# Patient Record
Sex: Female | Born: 1935 | Race: White | Hispanic: No | Marital: Married | Smoking: Never smoker
Health system: Southern US, Community
[De-identification: ages and names within clinical notes are randomized; demographics above are authoritative.]

## PROBLEM LIST (undated history)

## (undated) DIAGNOSIS — I447 Left bundle-branch block, unspecified: Secondary | ICD-10-CM

## (undated) DIAGNOSIS — E785 Hyperlipidemia, unspecified: Secondary | ICD-10-CM

## (undated) DIAGNOSIS — M1990 Unspecified osteoarthritis, unspecified site: Secondary | ICD-10-CM

## (undated) DIAGNOSIS — N289 Disorder of kidney and ureter, unspecified: Secondary | ICD-10-CM

## (undated) DIAGNOSIS — F419 Anxiety disorder, unspecified: Secondary | ICD-10-CM

## (undated) DIAGNOSIS — K635 Polyp of colon: Secondary | ICD-10-CM

## (undated) DIAGNOSIS — D649 Anemia, unspecified: Secondary | ICD-10-CM

## (undated) DIAGNOSIS — I2510 Atherosclerotic heart disease of native coronary artery without angina pectoris: Secondary | ICD-10-CM

## (undated) DIAGNOSIS — R040 Epistaxis: Secondary | ICD-10-CM

## (undated) DIAGNOSIS — N189 Chronic kidney disease, unspecified: Secondary | ICD-10-CM

## (undated) DIAGNOSIS — I10 Essential (primary) hypertension: Secondary | ICD-10-CM

## (undated) DIAGNOSIS — H269 Unspecified cataract: Secondary | ICD-10-CM

## (undated) HISTORY — DX: Atherosclerotic heart disease of native coronary artery without angina pectoris: I25.10

## (undated) HISTORY — DX: Anxiety disorder, unspecified: F41.9

## (undated) HISTORY — DX: Left bundle-branch block, unspecified: I44.7

## (undated) HISTORY — PX: BREAST SURGERY: SHX581

## (undated) HISTORY — DX: Epistaxis: R04.0

## (undated) HISTORY — DX: Unspecified cataract: H26.9

## (undated) HISTORY — PX: TONSILLECTOMY: SUR1361

## (undated) HISTORY — DX: Hyperlipidemia, unspecified: E78.5

## (undated) HISTORY — PX: PARTIAL HYSTERECTOMY: SHX80

---

## 2001-07-23 ENCOUNTER — Encounter: Payer: Self-pay | Admitting: Family Medicine

## 2001-07-23 ENCOUNTER — Ambulatory Visit (HOSPITAL_COMMUNITY): Admission: RE | Admit: 2001-07-23 | Discharge: 2001-07-23 | Payer: Self-pay | Admitting: Family Medicine

## 2002-10-15 ENCOUNTER — Ambulatory Visit (HOSPITAL_COMMUNITY): Admission: RE | Admit: 2002-10-15 | Discharge: 2002-10-15 | Payer: Self-pay | Admitting: Internal Medicine

## 2002-10-15 ENCOUNTER — Encounter: Payer: Self-pay | Admitting: Internal Medicine

## 2002-11-13 ENCOUNTER — Encounter: Payer: Self-pay | Admitting: Family Medicine

## 2002-11-13 ENCOUNTER — Ambulatory Visit (HOSPITAL_COMMUNITY): Admission: RE | Admit: 2002-11-13 | Discharge: 2002-11-13 | Payer: Self-pay | Admitting: Family Medicine

## 2002-11-14 ENCOUNTER — Encounter: Payer: Self-pay | Admitting: Family Medicine

## 2002-11-14 ENCOUNTER — Ambulatory Visit (HOSPITAL_COMMUNITY): Admission: RE | Admit: 2002-11-14 | Discharge: 2002-11-14 | Payer: Self-pay | Admitting: Family Medicine

## 2002-11-27 ENCOUNTER — Other Ambulatory Visit: Admission: RE | Admit: 2002-11-27 | Discharge: 2002-11-27 | Payer: Self-pay | Admitting: Obstetrics and Gynecology

## 2002-12-16 ENCOUNTER — Ambulatory Visit (HOSPITAL_COMMUNITY): Admission: RE | Admit: 2002-12-16 | Discharge: 2002-12-16 | Payer: Self-pay | Admitting: Obstetrics and Gynecology

## 2003-03-28 ENCOUNTER — Ambulatory Visit (HOSPITAL_COMMUNITY): Admission: RE | Admit: 2003-03-28 | Discharge: 2003-03-28 | Payer: Self-pay | Admitting: Internal Medicine

## 2003-04-04 ENCOUNTER — Ambulatory Visit (HOSPITAL_COMMUNITY): Admission: RE | Admit: 2003-04-04 | Discharge: 2003-04-04 | Payer: Self-pay | Admitting: Obstetrics and Gynecology

## 2003-06-06 ENCOUNTER — Ambulatory Visit (HOSPITAL_COMMUNITY): Admission: RE | Admit: 2003-06-06 | Discharge: 2003-06-06 | Payer: Self-pay | Admitting: Obstetrics and Gynecology

## 2003-06-19 ENCOUNTER — Inpatient Hospital Stay (HOSPITAL_COMMUNITY): Admission: RE | Admit: 2003-06-19 | Discharge: 2003-06-21 | Payer: Self-pay | Admitting: Obstetrics and Gynecology

## 2004-12-07 ENCOUNTER — Emergency Department (HOSPITAL_COMMUNITY): Admission: EM | Admit: 2004-12-07 | Discharge: 2004-12-07 | Payer: Self-pay | Admitting: Emergency Medicine

## 2004-12-24 ENCOUNTER — Ambulatory Visit (HOSPITAL_COMMUNITY): Admission: RE | Admit: 2004-12-24 | Discharge: 2004-12-24 | Payer: Self-pay | Admitting: Family Medicine

## 2005-02-16 ENCOUNTER — Ambulatory Visit (HOSPITAL_COMMUNITY): Admission: RE | Admit: 2005-02-16 | Discharge: 2005-02-16 | Payer: Self-pay | Admitting: Family Medicine

## 2005-09-21 ENCOUNTER — Ambulatory Visit (HOSPITAL_COMMUNITY): Admission: RE | Admit: 2005-09-21 | Discharge: 2005-09-21 | Payer: Self-pay | Admitting: Family Medicine

## 2006-08-21 ENCOUNTER — Ambulatory Visit (HOSPITAL_COMMUNITY): Admission: RE | Admit: 2006-08-21 | Discharge: 2006-08-21 | Payer: Self-pay | Admitting: Family Medicine

## 2006-09-25 ENCOUNTER — Ambulatory Visit (HOSPITAL_COMMUNITY): Admission: RE | Admit: 2006-09-25 | Discharge: 2006-09-25 | Payer: Self-pay | Admitting: Family Medicine

## 2007-08-01 ENCOUNTER — Ambulatory Visit (HOSPITAL_COMMUNITY): Admission: RE | Admit: 2007-08-01 | Discharge: 2007-08-01 | Payer: Self-pay | Admitting: Family Medicine

## 2007-08-06 ENCOUNTER — Ambulatory Visit (HOSPITAL_COMMUNITY): Admission: RE | Admit: 2007-08-06 | Discharge: 2007-08-06 | Payer: Self-pay | Admitting: Family Medicine

## 2008-02-19 ENCOUNTER — Ambulatory Visit (HOSPITAL_COMMUNITY): Admission: RE | Admit: 2008-02-19 | Discharge: 2008-02-19 | Payer: Self-pay | Admitting: Family Medicine

## 2008-06-17 ENCOUNTER — Ambulatory Visit (HOSPITAL_COMMUNITY): Admission: RE | Admit: 2008-06-17 | Discharge: 2008-06-17 | Payer: Self-pay | Admitting: Cardiology

## 2008-06-20 HISTORY — PX: CARDIAC CATHETERIZATION: SHX172

## 2009-08-07 ENCOUNTER — Ambulatory Visit (HOSPITAL_COMMUNITY): Admission: RE | Admit: 2009-08-07 | Discharge: 2009-08-07 | Payer: Self-pay | Admitting: Family Medicine

## 2009-08-10 ENCOUNTER — Ambulatory Visit (HOSPITAL_COMMUNITY): Admission: RE | Admit: 2009-08-10 | Discharge: 2009-08-10 | Payer: Self-pay | Admitting: Family Medicine

## 2010-03-24 ENCOUNTER — Ambulatory Visit (HOSPITAL_COMMUNITY): Admission: RE | Admit: 2010-03-24 | Discharge: 2010-03-24 | Payer: Self-pay | Admitting: Family Medicine

## 2010-05-27 ENCOUNTER — Emergency Department (HOSPITAL_COMMUNITY)
Admission: EM | Admit: 2010-05-27 | Discharge: 2010-05-28 | Payer: Self-pay | Source: Home / Self Care | Admitting: Emergency Medicine

## 2010-05-30 ENCOUNTER — Encounter: Payer: Self-pay | Admitting: Family Medicine

## 2010-05-31 LAB — DIFFERENTIAL
Eosinophils Absolute: 0.4 10*3/uL (ref 0.0–0.7)
Eosinophils Relative: 8 % — ABNORMAL HIGH (ref 0–5)
Lymphs Abs: 1.4 10*3/uL (ref 0.7–4.0)
Monocytes Absolute: 0.4 10*3/uL (ref 0.1–1.0)
Monocytes Relative: 8 % (ref 3–12)

## 2010-05-31 LAB — CBC
MCH: 33.4 pg (ref 26.0–34.0)
MCHC: 34.6 g/dL (ref 30.0–36.0)
MCV: 96.7 fL (ref 78.0–100.0)
Platelets: 149 10*3/uL — ABNORMAL LOW (ref 150–400)
RDW: 12.7 % (ref 11.5–15.5)

## 2010-05-31 LAB — POCT CARDIAC MARKERS: Troponin i, poc: <0.05 ng/mL (ref 0.00–0.09)

## 2010-05-31 LAB — BASIC METABOLIC PANEL
BUN: 30 mg/dL — ABNORMAL HIGH (ref 6–23)
Creatinine, Ser: 1.72 mg/dL — ABNORMAL HIGH (ref 0.4–1.2)
GFR calc non Af Amer: 29 mL/min — ABNORMAL LOW (ref >60)

## 2010-09-24 NOTE — H&P (Signed)
NAMEDORLISA, [name]                           ACCOUNT NO.:  [id number]   MEDICAL RECORD NO.:  [id number]                   PATIENT TYPE:  AMB   LOCATION:  DAY                                  FACILITY:  [facility]   PHYSICIAN:  Jonnie Kind, M.D.              DATE OF BIRTH:  02-Aug-1933   DATE OF ADMISSION:  DATE OF DISCHARGE:                                HISTORY & PHYSICAL   ADMISSION DIAGNOSIS:  Cervical dysplasia, endocervical involvement.   HISTORY OF PRESENT ILLNESS:  This 77 year old postmenopausal female is  admitted at this time after referral to our office.  She was admitted at  this time for cold knife conization.   [name] was seen in our office on referral from [facility], where Dr. Hulen Luster most recent Pap smear had shown suspected  high-grade dysplasia.  The final interpretation of the Pap smear was ASCUS-  H, in other words, atypical squamous cells, cannot exclude high-grade  dysplasia.  The patient was seen in our office, had colposcopic evaluation  on November 27, 2002, which showed no visible dysplasia on the exocervix on  colposcopic evaluation but did show a narrow endocervical canal, for which  endocervical curettage was performed.  This returned showing moderate-grade  dysplasia with endocervical epithelium mixed together.  The fact that she  has a dysplastic lesion inside the endocervical canal requires excision of  the endocervix.  The patient has been seen in our office, counseled over the  pros and cons of surgery, and desires to proceed at this time.   PAST MEDICAL HISTORY:  Positive for hypertension.   PAST SURGICAL HISTORY:  Negative.   INJURIES:  None.   ALLERGIES:  None.   MEDICATIONS:  Altace for blood pressure.   FAMILY HISTORY:  Positive for liver cancer in her mother at age 51 and  metastatic lung cancer in her father, cause of death at age 73.   PHYSICAL EXAMINATION:  GENERAL:  A healthy-appearing Caucasian female who  appears her stated age, who is alert and oriented x3.  HEENT:  Pupils equal, round, and reactive, extraocular movements intact.  NECK:  Supple, trachea midline.  CHEST:  Clear to auscultation.  ABDOMEN:  Nontender.  PELVIC:  External genitalia normal.  Cervix atrophic with atrophic vaginal  tissues.  Uterus anteflexed, within  normal limits size.  Adnexa negative for masses or tenderness.  EXTREMITIES:  Grossly normal.   PLAN:  Cold knife conization to be performed December 16, 2002, at [facility].                                               Jonnie Kind, M.D.    JVF/MEDQ  D:  12/10/2002  T:  12/10/2002  Job:  [id number]  cc:   Bonne Dolores, M.D.  [address], [place]  [place] [address]  Fax: [phone]

## 2010-09-24 NOTE — Op Note (Signed)
   NAMEJILENE, [name]                           ACCOUNT NO.:  [id number]   MEDICAL RECORD NO.:  [id number]                   PATIENT TYPE:  AMB   LOCATION:  DAY                                  FACILITY:  [facility]   PHYSICIAN:  Jonnie Kind, M.D.              DATE OF BIRTH:  09/24/1933   DATE OF PROCEDURE:  12/16/2002  DATE OF DISCHARGE:                                 OPERATIVE REPORT   PREOPERATIVE DIAGNOSIS:  Endocervical dysplasia.   POSTOPERATIVE DIAGNOSIS:  Endocervical dysplasia.   PROCEDURE:  Cold knife conization.   SURGEON:  Jonnie Kind, M.D.   ANESTHESIA:  General.   COMPLICATIONS:  None.   FINDINGS:  Good uterine descent.  Bulbous cervix.  Technically challenging  cone due to difficulty cutting through the fibrous endocervical tissues.  Posterior aspects of the endocervical canal were thoroughly biopsied.  The  anterior portion was only about 1 cm in transverse depth.   DETAILS OF PROCEDURE:  The patient was taken to the operating room and  prepped and draped in the usual fashion for a vaginal procedure with cervix  visible through a large Graves speculum and then the cervix grasped with a  single-tooth tenaculum.  We then decided to circumscribe the cervix with an  ellipse technique.  The cervix was circumscribed with a #15 blade, then  converted to a #11 for dissection through the thicker connective tissue of  the lower uterine segment.  The specimen was cored out in an apparent  symmetric fashion, inspected, and confirmed as being intact, but there was  more of the tissue removed posteriorly than anteriorly.  We inspected the  pedicle and did not feel like further surgical dissection was indicated, so  then we proceeded with Monsel's solution placement on the cervix and then  the patient advised to use Surgicel.  Follow-up will be in two weeks in our  office.                                                 Jonnie Kind, M.D.    JVF/MEDQ  D:   12/16/2002  T:  12/16/2002  Job:  [id number]

## 2010-09-24 NOTE — Op Note (Signed)
[name], [name]                           ACCOUNT NO.:  [id number]   MEDICAL RECORD NO.:  [id number]                   PATIENT TYPE:   LOCATION:                                       FACILITY:  [facility]   PHYSICIAN:  Jonnie Kind, M.D.              DATE OF BIRTH:  01/30/1934   DATE OF PROCEDURE:  DATE OF DISCHARGE:                                 OPERATIVE REPORT   ADMITTING DIAGNOSES:  1. Recurrent endocervical dysplasia.  2. Status post cold knife conization.  3. Right lower quadrant pain uncertain etiology.  4. Essentially hypertension.   DISCHARGE DIAGNOSES:  1. Recurrent endocervical dysplasia.  2. Status post cold knife conization.  3. Right lower quadrant pain uncertain etiology.  4. Essentially hypertension.   PROCEDURE:  1. Total abdominal hysterectomy.  2. Bilateral salpingo-oophorectomy.   SURGEON:  Jonnie Kind, M.D.   ASSISTANT:  None.   ANESTHESIA:  General.   COMPLICATIONS:  None.   ESTIMATED BLOOD LOSS:  100 cc.   DETAILS OF PROCEDURE:  The patient was taken to the operating room and  prepped and draped for lower abdominal surgery.  A Pfannenstiel-type  incision was performed with easy remaining approximately 10 cm in transverse  diameter.  A bladder flap was developed with ease on the lower uterine  segment and Balfour retractor positioned.   Round ligaments were taken down bilaterally followed by isolation of the  infundibulopelvic ligament confirming its safe position, then clamping,  cutting and doubly ligating.  We then skeletonized the uterine vessels  taking down the round ligaments, anteriorly, and gradually marching down the  broad ligament to reach the cardinal ligaments.  We had the cuff freed from  the bladder.  We marched down serially, clamping, cutting, and suture  ligating, with removal of the uterus tubes and ovaries.  The cuff was closed  in a single layer of running locking closure.  The pelvic was irrigated and  then the  patient allowed to have the abdomen closed by a 2-0 chromic closure  of the anterior  peritoneum, #0 Vicryl closure of the lateral fascia, 2-0 plain used to close  the subcu tissues with a Jackson-Pratt drain placed and staple closure.  The  patient tolerated the procedure well and went to recovery room in good  condition.      ___________________________________________                                            Jonnie Kind, M.D.   JVF/MEDQ  D:  08/03/2003  T:  08/04/2003  Job:  [id number]

## 2010-09-24 NOTE — H&P (Signed)
[name], [name]                           ACCOUNT NO.:  [id number]   MEDICAL RECORD NO.:  [id number]                   PATIENT TYPE:  AMB   LOCATION:  DAY                                  FACILITY:  [facility]   PHYSICIAN:  Jonnie Kind, M.D.              DATE OF BIRTH:  05/21/1933   DATE OF ADMISSION:  06/19/2003  DATE OF DISCHARGE:                                HISTORY & PHYSICAL   ADMITTING DIAGNOSES:  1. Recurrent cervical dysplasia, status post conization for endocervical     dysplasia.  2. Right lower quadrant pain x1 year with negative workup.  3. Mild anemia with negative workup.   HISTORY OF PRESENT ILLNESS:  This 77 year old post menopausal female is  admitted at this time for abdominal hysterectomy with removal of tubes and  ovaries and probable appendectomy for evaluation and correction of recurrent  cervical dysplasia. She was referred to our office from [facility], Dr. Bonne Dolores, last August and had a conization after  endocervical involvement of a high-grade cervical dysplasia was identified.  Generous cone specimen was obtained and we had a rather stenotic healing  cervix at the end of the procedure.  Postconization monitoring of the  cervical abnormality is returned abnormal with indications of the same  dysplastic process persisting despite the prior cone effort.  Pros and cons  of treatment have been discussed with the patient and our plan is to proceed  with a hysterectomy at this time.   REVIEW OF SYSTEMS:  Notable for chronic left-sided pain at the waist  rotating around somewhat from the back.  She has had an extensive workup  including an MRI ordered January 2005 which was negative for any right-sided  disk herniation.  There were degenerative changes of  the lumbar spine, but  nothing else noted.   During the past year the patient has had a screening colonoscopy which was  negative; and has had a CT of the abdomen and back and  kidney in 2004 all by  Dr. Caron Presume; of all which were negative.  Abdominal ultrasound and  transvaginal ultrasound shows a normal uterus calcifications considered  normal for the patient's age with no evidence of pelvic masses.  Right  kidney is grossly normal, and pancreas, aorta, and gallbladder and liver are  considered normal with common bile duct normal in diameter.   PAST MEDICAL HISTORY:  See review of systems and HPI.  PMH also positive for  hypertension, treated with Altace.   SURGICAL HISTORY:  Cold knife conization 2004; otherwise negative.   ALLERGIES:  None.   PHYSICAL EXAMINATION:  VITAL SIGNS:  Height 5 feet 2 inches; weight 151  which is a 4 pound weight loss since August 2004 (patient and husband have  been trying to loss weight; she has lost 4 pounds; he has lost 20).  HEENT:  The pupils are equal round, reactive.  Extraocular movements are  intact.  NECK:  Supple.  Trachea midline.  CHEST:  Clear to auscultation.  ABDOMEN:  Nontender.  PELVIC:  External genitalia normal with rough fibrotic cervix, status post  conization.  Uterus seems normal in size.  Adnexa are negative for masses,  but there is tenderness on palpation of the right lower quadrant as  described and worked up as noted in the HPI.   ADDITIONAL LABS:  Hemoglobin 10.7, hematocrit 31.6 with normochromic  normocytic values.  Platelets are normal at 193,000.  Electrolytes are  notable for a potassium 3.9, BUN 27, creatinine 1.4, with glucose mildly  elevated at 133 on  a nonfasting value.  Blood type is A positive.   PLAN:  Abdominal hysterectomy, removal of tubes and ovaries, evaluation of  right lower quadrant with probable removal of appendix on June 19, 2003.     ___________________________________________                                         Jonnie Kind, M.D.   JVF/MEDQ  D:  06/19/2003  T:  06/19/2003  Job:  [id number]   cc:   Jonnie Kind, M.D.  [place] [address]  Fax: [phone]   Bonne Dolores, M.D.  [address] [place]  [place] [address]  Fax: [phone]

## 2010-09-24 NOTE — Op Note (Signed)
[name], [name]                           ACCOUNT NO.:  [id number]   MEDICAL RECORD NO.:  [id number]                   PATIENT TYPE:  AMB   LOCATION:  DAY                                  FACILITY:  [facility]   PHYSICIAN:  Hildred Laser, M.D.                 DATE OF BIRTH:  Oct 23, 1933   DATE OF PROCEDURE:  03/28/2003  DATE OF DISCHARGE:                                 OPERATIVE REPORT   PROCEDURE:  Colonoscopy with polypectomy.   INDICATIONS FOR PROCEDURE:  [name] is a 77 year old Caucasian female who  is undergoing screening colonoscopy.  The procedure and risks were reviewed  with the patient, and informed consent was obtained.   PREOPERATIVE MEDICATIONS:  Demerol 50 mg IV, Versed 7 mg IV in divided  doses.   FINDINGS:  The procedure was performed in the endoscopy suite.  The  patient's vital signs and O2 saturations were monitored during the procedure  and remained stable.  The patient was placed in the left lateral recumbent  position and rectal examination performed.  No abnormality noted on external  or digital exam.  The Olympus videoscope was placed into the rectum and  advanced into the region of the sigmoid colon and beyond.  The preparation  was excellent.  The scope was passed to the cecum.  She had some stool  coating the cecal mucosa, but this did not interfere with the quality of the  exam.  The cecal landmarks were well-identified and photographed for the  record.  There was a small polyp to the right of the appendiceal orifice  which was ablated via cold biopsy.  There was another flat polyp on a fold  at the hepatic flexure which was snared piecemeal.  The mucosa of the rest  of the colon was normal.  The rectal mucosa similarly was normal.  The scope  was retroflexed to examine the anorectal junction, with prominent anal  papilla.  The endoscope was straightened and withdrawn.  The patient  tolerated the procedure well.   FINAL DIAGNOSES:  1. Examination  performed to the cecum.  2. Two small polyps.  The one from the cecum was ablated via cold biopsy and     the other one from the hepatic flexure was snared.  This was a flat polyp     about 8 to 10 mm in maximal diameter.   RECOMMENDATIONS:  1. Standard instructions given.  2. I will be contacting the patient with the biopsy results and timing of     her next colonoscopy.      ___________________________________________                                            Hildred Laser, M.D.   NR/MEDQ  D:  03/28/2003  T:  03/28/2003  Job:  [id number]   cc:   Bonne Dolores, M.D.  [address], [place] [address]  Fax: [phone]

## 2010-09-24 NOTE — Discharge Summary (Signed)
NAMELETTYE, BOHLS                           ACCOUNT NO.:  [id number]   MEDICAL RECORD NO.:  [id number]                   PATIENT TYPE:  INP   LOCATION:  A403                                 FACILITY:  [facility]   PHYSICIAN:  Jonnie Kind, M.D.              DATE OF BIRTH:  08/27/1933   DATE OF ADMISSION:  06/19/2003  DATE OF DISCHARGE:  06/21/2003                                 DISCHARGE SUMMARY   ADMISSION DIAGNOSES:  1. Recurrent endocervical dysplasia, status post cold-knife conization.  2. Right lower quadrant pain of uncertain etiology.  3. Essential hypertension.   DISCHARGE DIAGNOSES:  1. Recurrent endocervical dysplasia.  2. Right lower quadrant pain.  3. Essential hypertension.  4. Mild anemia, uncertain etiology.   PROCEDURE:  On June 18, 2002, total abdominal hysterectomy with  bilateral salingo-oophorectomy by Dr. Glo Herring.   DISCHARGE MEDICATIONS:  1. Altace 5 mg p.o. q.d.  2. Motrin 200 mg two tablets q.4h. p.r.n. mild pain.   FOLLOW UP:  Follow up in one week for staple removal in our office.  Follow  up in two weeks for blood pressure recheck with Dr. Caron Presume.   HISTORY OF PRESENT ILLNESS:  This 77 year old, post menopausal female is  admitted for hysterectomy after recurrent cervical dysplasia, status post  cold-knife conization that healed poorly and was perceived as difficult to  follow due to the stenotic healing that occurred.  Additionally, she has  chronic right lower quadrant pain frustrating the patient.  The patient has  had a negative workup including colonoscopy and CT of the abdomen in 2004.  Preoperative workup was notable for mild anemia with hemoglobin 10,  hematocrit 31.6 on preop CBC.  Platelets were normochromic, normocytic.  BUN  was 27 and creatinine 1.4 on admitting labs with potassium 3.9.   Additionally, preoperative EKG shows normal size rhythm with left bundle  branch block and was suggested to be considered for possible left  atrial  enlargement.   HOSPITAL COURSE:  The patient tolerated the surgical procedure well.  She  had a 100 cc blood loss and did not require transfusion despite the  preoperative anemia.  Postoperative course was notable for impressively  elevated blood pressures from [id number] systolic with diastolics in the [id number]  usually.  She required her regular Altace and in addition required  Apresoline on several occasions to keep blood pressures in the 0000000 systolic  range.  The patient states that her normal pressures run in the 160 range.   Pathology report is pending at the time of discharge on postop day #2 with  hematocrit noted at 28.2, hemoglobin 9.7 within normal limits considering  surgical blood loss.   The patient was discharged to home and is asked to follow up in one week in  our office and then also to see Dr. Caron Presume with serial blood pressure  checks from home.  It is possible, particularly in  light of the elevated BUN  and creatinine, she may need blood pressure medication adjustments.     ___________________________________________                                         Jonnie Kind, M.D.   JVF/MEDQ  D:  06/21/2003  T:  06/21/2003  Job:  [id number]   cc:   Bonne Dolores, M.D.  [address] [place] [address]  Fax: [phone]

## 2010-10-08 HISTORY — PX: TRANSTHORACIC ECHOCARDIOGRAM: SHX275

## 2010-11-04 ENCOUNTER — Encounter (HOSPITAL_COMMUNITY): Payer: Self-pay

## 2010-11-04 ENCOUNTER — Ambulatory Visit (HOSPITAL_COMMUNITY)
Admission: RE | Admit: 2010-11-04 | Discharge: 2010-11-04 | Disposition: A | Discharge status: Home / Self Care | Payer: Medicare Other | Source: Ambulatory Visit | Attending: Cardiovascular Disease | Admitting: Cardiovascular Disease

## 2010-11-04 ENCOUNTER — Other Ambulatory Visit (HOSPITAL_COMMUNITY): Payer: Self-pay | Admitting: Cardiovascular Disease

## 2010-11-04 DIAGNOSIS — R0602 Shortness of breath: Secondary | ICD-10-CM | POA: Insufficient documentation

## 2010-11-04 DIAGNOSIS — R0789 Other chest pain: Secondary | ICD-10-CM | POA: Insufficient documentation

## 2010-11-04 HISTORY — DX: Essential (primary) hypertension: I10

## 2010-11-05 ENCOUNTER — Ambulatory Visit (HOSPITAL_COMMUNITY)
Admission: RE | Admit: 2010-11-05 | Discharge: 2010-11-05 | Disposition: A | Discharge status: Home / Self Care | Payer: Medicare Other | Source: Ambulatory Visit | Attending: Cardiovascular Disease | Admitting: Cardiovascular Disease

## 2010-11-05 DIAGNOSIS — I10 Essential (primary) hypertension: Secondary | ICD-10-CM | POA: Insufficient documentation

## 2010-11-05 DIAGNOSIS — E785 Hyperlipidemia, unspecified: Secondary | ICD-10-CM | POA: Insufficient documentation

## 2010-11-05 DIAGNOSIS — I2510 Atherosclerotic heart disease of native coronary artery without angina pectoris: Secondary | ICD-10-CM | POA: Insufficient documentation

## 2010-11-05 DIAGNOSIS — R0602 Shortness of breath: Secondary | ICD-10-CM | POA: Insufficient documentation

## 2010-11-05 DIAGNOSIS — Z01818 Encounter for other preprocedural examination: Secondary | ICD-10-CM | POA: Insufficient documentation

## 2010-11-05 DIAGNOSIS — R079 Chest pain, unspecified: Secondary | ICD-10-CM | POA: Insufficient documentation

## 2010-11-05 DIAGNOSIS — N289 Disorder of kidney and ureter, unspecified: Secondary | ICD-10-CM | POA: Insufficient documentation

## 2010-11-05 DIAGNOSIS — I447 Left bundle-branch block, unspecified: Secondary | ICD-10-CM | POA: Insufficient documentation

## 2010-11-08 ENCOUNTER — Ambulatory Visit (HOSPITAL_COMMUNITY)
Admission: RE | Admit: 2010-11-08 | Discharge: 2010-11-10 | Disposition: A | Discharge status: Home / Self Care | Payer: Medicare Other | Source: Ambulatory Visit | Attending: Cardiovascular Disease | Admitting: Cardiovascular Disease

## 2010-11-08 DIAGNOSIS — I200 Unstable angina: Secondary | ICD-10-CM | POA: Insufficient documentation

## 2010-11-08 DIAGNOSIS — Z01812 Encounter for preprocedural laboratory examination: Secondary | ICD-10-CM | POA: Insufficient documentation

## 2010-11-08 DIAGNOSIS — Z79899 Other long term (current) drug therapy: Secondary | ICD-10-CM | POA: Insufficient documentation

## 2010-11-08 DIAGNOSIS — I129 Hypertensive chronic kidney disease with stage 1 through stage 4 chronic kidney disease, or unspecified chronic kidney disease: Secondary | ICD-10-CM | POA: Insufficient documentation

## 2010-11-08 DIAGNOSIS — I447 Left bundle-branch block, unspecified: Secondary | ICD-10-CM | POA: Insufficient documentation

## 2010-11-08 DIAGNOSIS — I2510 Atherosclerotic heart disease of native coronary artery without angina pectoris: Secondary | ICD-10-CM | POA: Insufficient documentation

## 2010-11-08 DIAGNOSIS — E785 Hyperlipidemia, unspecified: Secondary | ICD-10-CM | POA: Insufficient documentation

## 2010-11-08 DIAGNOSIS — Z01810 Encounter for preprocedural cardiovascular examination: Secondary | ICD-10-CM | POA: Insufficient documentation

## 2010-11-08 DIAGNOSIS — N184 Chronic kidney disease, stage 4 (severe): Secondary | ICD-10-CM | POA: Insufficient documentation

## 2010-11-08 DIAGNOSIS — D61818 Other pancytopenia: Secondary | ICD-10-CM | POA: Insufficient documentation

## 2010-11-08 HISTORY — PX: CORONARY ANGIOPLASTY WITH STENT PLACEMENT: SHX49

## 2010-11-08 LAB — COMPREHENSIVE METABOLIC PANEL
Alkaline Phosphatase: 73 U/L (ref 39–117)
BUN: 26 mg/dL — ABNORMAL HIGH (ref 6–23)
GFR calc Af Amer: 34 mL/min — ABNORMAL LOW (ref >60)
GFR calc non Af Amer: 28 mL/min — ABNORMAL LOW (ref >60)
Glucose, Bld: 102 mg/dL — ABNORMAL HIGH (ref 70–99)
Sodium: 140 mEq/L (ref 135–145)
Total Bilirubin: 0.4 mg/dL (ref 0.3–1.2)

## 2010-11-08 LAB — APTT: aPTT: 32 seconds (ref 24–37)

## 2010-11-08 LAB — CBC
MCHC: 33.7 g/dL (ref 30.0–36.0)
Platelets: 141 10*3/uL — ABNORMAL LOW (ref 150–400)
RDW: 12.8 % (ref 11.5–15.5)
WBC: 3.4 10*3/uL — ABNORMAL LOW (ref 4.0–10.5)

## 2010-11-08 LAB — PLATELET INHIBITION P2Y12: Platelet Function  P2Y12: 390 [PRU] (ref 194–418)

## 2010-11-08 LAB — PROTIME-INR: INR: 1.03 (ref 0.00–1.49)

## 2010-11-08 NOTE — Cardiovascular Report (Signed)
NAMECYBILL, BRINKMANN NO.:  [id number]  MEDICAL RECORD NO.:  [id number]  LOCATION:  [facility]                         FACILITY:  [facility]  PHYSICIAN:  Shelva Majestic, M.D.     DATE OF BIRTH:  1933-12-16  DATE OF PROCEDURE:  11/05/2010 DATE OF DISCHARGE:  11/05/2010                           CARDIAC CATHETERIZATION   INDICATIONS:  [name] is a 77 year old female who has documented coronary disease by prior cardiac catheterization in February 2010.  At that time, she was treated medically.  She has a history of hypertension, hyperlipidemia, and chronic left bundle-branch block.  She has renal insufficiency with creatinines in the 1.8 range which have remained stable.  Over the past week, she has noticed a progressive decline in exertional capacity with recurrent episodes of exertionally precipitated chest pressure associated with shortness of breath and arm radiation.  She was seen in the [place] office yesterday.  In light of her prior coronary artery disease with increasing symptoms on medical therapy, she presents now for cardiac catheterization.  She was told to hold her ACE inhibition last evening and this morning and her nitrates were increased.  Prior to the procedure, the patient was hydrated this morning and also was started on sodium bicarbonate.  Her creatinine yesterday was stable at 1.8.  PROCEDURE:  After premedication with Versed 1 mg plus fentanyl 25 mcg, the patient was prepped and draped in the usual fashion.  The right femoral artery was punctured anteriorly and a 5-French sheath was inserted without difficulty.  Diagnostic cardiac catheterization was done utilizing 5-French Judkins #4 left coronary catheter.  A JR-4 right was initially used, but due to suboptimal engagement a 5-French no- torque catheter was used for selective angiography in the right coronary artery.  A 5-French pigtail catheter was then inserted and advanced into the left  ventricle.  Due the patient's renal insufficiency, left ventriculography was performed.  LV pressures were recorded and the catheter was pulled back into the central aorta.  Plans were to reassess LV function with echo Doppler evaluation today.  The patient tolerated the procedure well.  Hemostasis was obtained by direct manual pressure.  Central aortic pressure was 175/76.  Left ventricular pressure 175/12, post A-wave 17.  ANGIOGRAPHIC DATA:  Left main coronary was an upward takeoff vessel that trifurcated into an LAD, ramus intermediate vessel, and left circumflex system.  The LAD was significantly calcified in its proximal segment extending to the mid segment.  There was 30% narrowing proximal to the septal perforating artery and diagonal vessel with eccentric calcified 80% stenosis after the diagonal vessel.  The remainder of the LAD was free of significant disease and extended around the apex.  The ramus intermediate vessel had diffuse proximal 30% narrowing.  The circumflex vessel gave rise to one proximal marginal vessel and ended in an inferior LV-like branch extending to the apex.  The right coronary artery was a large calcified vessel that had minimal luminal irregularity proximally.  There was 30% narrowing in the ostium of the PDA.  IMPRESSION: 1. Systemic hypertension. 2. Diffuse coronary calcification with 30-80% progressive calcified     stenosis in the left anterior descending artery  proximally at the     region of the septal and diagonal takeoff.  RECOMMENDATIONS:  The patient will be aggressively hydrated following the procedure and we will continue sodium bicarbonate infusion.  A 2-D echo Doppler study will be done to reassess LV function.  She will be tentatively scheduled for deferred PCI tentatively scheduled for November 08, 2010, with rotational atherectomy, PCI to the LAD.          ______________________________ Shelva Majestic, M.D.     TK/MEDQ  D:   11/05/2010  T:  11/05/2010  Job:  [id number]  cc:   Halford Chessman, M.D.  Electronically Signed by Shelva Majestic M.D. on 11/08/2010 04:31:01 PM

## 2010-11-09 LAB — HEMOGLOBIN AND HEMATOCRIT, BLOOD
HCT: 26.3 % — ABNORMAL LOW (ref 36.0–46.0)
HCT: 27.3 % — ABNORMAL LOW (ref 36.0–46.0)
Hemoglobin: 9.1 g/dL — ABNORMAL LOW (ref 12.0–15.0)

## 2010-11-09 LAB — BASIC METABOLIC PANEL
BUN: 21 mg/dL (ref 6–23)
Chloride: 106 mEq/L (ref 96–112)
GFR calc Af Amer: 39 mL/min — ABNORMAL LOW (ref >60)
Glucose, Bld: 98 mg/dL (ref 70–99)
Potassium: 4 mEq/L (ref 3.5–5.1)
Sodium: 139 mEq/L (ref 135–145)

## 2010-11-09 LAB — CARDIAC PANEL(CRET KIN+CKTOT+MB+TROPI): Relative Index: INVALID (ref 0.0–2.5)

## 2010-11-09 LAB — CBC
HCT: 24.9 % — ABNORMAL LOW (ref 36.0–46.0)
Hemoglobin: 8.4 g/dL — ABNORMAL LOW (ref 12.0–15.0)
WBC: 3.9 10*3/uL — ABNORMAL LOW (ref 4.0–10.5)

## 2010-11-10 LAB — CBC
HCT: 24.6 % — ABNORMAL LOW (ref 36.0–46.0)
MCHC: 33.3 g/dL (ref 30.0–36.0)
Platelets: 106 10*3/uL — ABNORMAL LOW (ref 150–400)
RDW: 13.2 % (ref 11.5–15.5)
WBC: 3.1 10*3/uL — ABNORMAL LOW (ref 4.0–10.5)

## 2010-11-10 LAB — BASIC METABOLIC PANEL
Chloride: 109 mEq/L (ref 96–112)
Creatinine, Ser: 1.86 mg/dL — ABNORMAL HIGH (ref 0.50–1.10)
GFR calc Af Amer: 32 mL/min — ABNORMAL LOW (ref >60)
GFR calc non Af Amer: 26 mL/min — ABNORMAL LOW (ref >60)

## 2010-11-12 ENCOUNTER — Other Ambulatory Visit (HOSPITAL_BASED_OUTPATIENT_CLINIC_OR_DEPARTMENT_OTHER): Payer: Self-pay | Admitting: Family Medicine

## 2010-11-12 ENCOUNTER — Other Ambulatory Visit (HOSPITAL_COMMUNITY): Payer: Self-pay | Admitting: Family Medicine

## 2010-11-23 NOTE — Discharge Summary (Signed)
  [name], [name] NO.:  [id number]  MEDICAL RECORD NO.:  [id number]  LOCATION:  2917                         FACILITY:  [facility]  PHYSICIAN:  Shelva Majestic, M.D.     DATE OF BIRTH:  1933/10/12  DATE OF ADMISSION:  11/08/2010 DATE OF DISCHARGE:  11/10/2010                              DISCHARGE SUMMARY   DISCHARGE DIAGNOSES: 1. Recent onset of unstable angina, admitted on November 08, 2010, for     elective left anterior descending coronary artery intervention. 2. Stage III-IV chronic renal insufficiency with a creatinine of 1.86     at discharge. 3. Pancytopenia, etiology not fully determined, there is a component     of blood loss anemia post percutaneous coronary intervention. 4. Treated dyslipidemia. 5. Treated hypertension.  HOSPITAL COURSE:  The patient is a pleasant 77 year old female with known coronary disease.  She has had previous catheterization in 2010, and had an 80% LAD stenosis.  Medical therapy was initiated.  She has chronic renal insufficiency.  She presented on November 04, 2010, with increasing anginal symptoms.  She was admitted for an outpatient catheterization on November 05, 2010, and this showed a 80% LAD lesion.  It was decided to hydrate her and electively intervene on the LAD.  This was done on November 08, 2010.  She had high-speed rotational atherectomy and a Promus stent placed.  She tolerated the procedure well.  She did have a slight bump in her troponin postprocedure to 0.32.  She also dropped her hemoglobin to a level of 8.4.  Creatinine rose to 1.86 at discharge. Dr. Debara Pickett, saw her on the morning of November 10, 2010.  He feels her hemoglobin is now stable at 8.2.  He notes she has pancytopenia with a white count of 3.1 and platelet count of 106.  He suggest she be considered for further hematologic workup as an outpatient, this will be deferred to her primary care doctor.  Her creatinine at discharge is 1.86.  We will check followup labs on  Monday and she will see [name], in followup.  Please see med rec for complete discharge medications.  LABORATORY DATA:  At discharge, white count 3.1, hemoglobin 8.2, hematocrit 24.6, platelets 106.  Sodium 142, potassium 4.3, BUN 27, creatinine 1.86, troponin peaked at 0.32.  On admission, her creatinine was 1.76, hemoglobin was 10.2.  INR was 1.03.  Chest x-ray on October 27, 2010, shows no acute abnormalities.  EKG shows sinus rhythm, left bundle- branch block.  DISPOSITION:  The patient discharged in stable condition and will follow up with [name], in [place] and Dr. Hilma Favors.  She may need a Hematology referral as an outpatient.  We will check labs next week.     Erlene Quan, P.A.   ______________________________ Shelva Majestic, M.D.    Meryl Dare  D:  11/10/2010  T:  11/10/2010  Job:  [id number]  cc:   Halford Chessman, M.D.  Electronically Signed by Kerin Ransom P.A. on 11/11/2010 12:01:54 PM Electronically Signed by Shelva Majestic M.D. on 11/23/2010 03:29:16 PM

## 2010-11-23 NOTE — Cardiovascular Report (Signed)
[name], [name] NO.:  [id number]  MEDICAL RECORD NO.:  [id number]  LOCATION:  2917                         FACILITY:  [facility]  PHYSICIAN:  Shelva Majestic, M.D.     DATE OF BIRTH:  February 27, 1934  DATE OF PROCEDURE:  11/08/2010 DATE OF DISCHARGE:                           CARDIAC CATHETERIZATION   PROCEDURE:  Complex percutaneous coronary intervention.  OPERATOR:  Shelva Majestic, MD  INDICATIONS:  [name] is a very pleasant 77 year old female with documented coronary artery disease by initial cardiac catheterization in February 2010.  She has a history of hypertension, hyperlipidemia, chronic left bundle-branch block as well as chronic renal insufficiency with creatinines in the 1.8 range.  Due to worsening symptoms of exertional chest pain on November 05, 2010, a repeat cardiac catheterization showed no progression of her calcified LAD lesion which was 80% stenosed in the region of the diagonal and septal perforating artery.  The proximal LAD was significantly calcified.  She had 30% narrowing in the intermediate vessel, normal left circumflex system, and calcification of the right coronary artery with 30% RCA narrowing.  The patient was aggressively hydrated following her cardiac catheterization.  She now represents today for attempt at complex percutaneous coronary intervention with high-speed rotational atherectomy/PTCA/stenting, however, calcified LAD lesion.  Of note, creatinine today was 1.78 and the patient was pretreated as she was last week with fluids as well as sodium bicarbonate.  PROCEDURE IN DETAIL:  After premedication with Versed 2 mg plus fentanyl 25 mcg, the patient prepped and draped in usual fashion.  Her right femoral artery and right femoral vein were punctured anteriorly and a 7- French arterial sheath and 6-French venous sheath were inserted.  A transvenous pacemaker was advanced to the RV apex with documentation of excellent  capture and was used for backup during the high-speed rotational atherectomy procedure.  A 7-French Q-curve 3.5 guide was used for the intervention.  The patient had received 600 mg of Plavix approximately 3 hours prior to the procedure.  Bivalirudin was administered and ACT was documented to be therapeutic.  Initial attempts at passing a roto floppy wire were unsuccessful with the wire seemingly to get into every small branch, but not able to get to and cross the lesion.  After multiple attempts at this, decision was made to use a Luge wire in its place for cannulation of the lesion which was successful.  A transient like catheter was then inserted for exchange and was advanced distally to the end of the Luge wire.  The Luge wire was then removed.  The roto floppy wire was then attempted to be inserted but apparently this was never able to get distal to the distal aspect of the transient type catheter.  Consequently, the entire system was then removed.  Another attempt was made with a roto floppy wire by itself, again unsuccessful.  The Luge wire was then re-advanced and was down the LAD. A 2.5 TREK over the wire balloon was then inserted for a wire exchange and the Luge wire was removed and in its place the roto floppy wire was advanced to the LAD.  A 1.5 mm bur was then platformed at  175 revolutions per minute.  The 1.5 bur was advanced to the distal tip of the catheter but unfortunately at this time the roto floppy wire had pulled back across the lesion and then was unable to be recrossed. Consequently, the entire system again had to be removed.  The Luge wire was then re-advanced down the LAD, the 2.5 mm over the wire balloon was again used for wire exchange and the roto floppy wire was re-advanced to the distal LAD.  The 1.5 mm bur was then readvanced.  This time, the wire did not pull back and several runs were made in the proximal LAD with the 1.5 mm bur.  The patient was also on  IV nitroglycerin and did receive numerous doses of IC nitroglycerin.  The 1.5 mm bur was then down glided out and exchanged for 1.75 mm bur.  Multiple runs were made with 1.75 mm bur with significant debulking of the calcified LAD stenoses.  The bur was then down glided out.  The previous 2.5 mm balloon was used for low-level post high-speed rotational atherectomy dilatation of all sites that had undergone atherectomy.  This balloon was then removed.  A 3.5 x 28 mm Promus Element DES stent was then inserted.  Careful attention was made to position the stent just distal to the ramus intermediate takeoff but recovered all calcified areas of that proximal LAD.  This was dilated x2 up to 13 atmospheres.  A 3.5 x 20 mm noncompliant TREK was used for poststent dilatation with dilatation up to approximately 3.38 mm.  [facility] angiography confirmed an excellent angiographic result.  The patient left the catheterization laboratory in stable condition and stable hemodynamics.  HEMODYNAMIC DATA:  Central aortic pressure was 164/65.  During the procedure, the patient was titrated on IV nitroglycerin and had received numerous doses of intracoronary nitroglycerin.  ANGIOGRAPHIC DATA:  Please refer to the diagnostic catheterization report from November 05, 2010.  At the start of the interventional procedure today, the LAD was again noted to be significantly calcified with calcification commencing just distal to its takeoff from the left main, ramus intermediate, and circumflex origins.  There was a segmental narrowing of 50% in this proximal calcified segment and then after the septal perforator and diagonal vessel, the vessel was 80% eccentrically stenosed in a calcified manner.  Following successful high-speed rotational atherectomy, PTCA, with ultimate stenting with a 3.0 x 28 mm Promus Element DES stent postdilated at 3.38 mm, the entire proximal LAD was reduced to 0%.  There was brisk TIMI 3 flow.   There was no evidence for dissection.  At the completion of the procedure, the temporary pacemaker was removed.  The arterial venous sheath was sutured in place. The patient will continue the bivalirudin drip until completion.  She left the catheterization laboratory in stable condition.  IMPRESSION: 1. Difficult but successful complex percutaneous coronary prevention     of calcified proximal left anterior descending stenosis with     ultimate high-speed rotational atherectomy, percutaneous     transluminal coronary angioplasty/stenting with a 3.0 x 28 mm     Promus Element drug eluting stent postdilated to 3.38 mm. 2. Temporary pacemaker insertion. 3. IV nitroglycerin, 600 mg Plavix, bivalirudin as well as IC     nitroglycerin.  The patient will be aggressively hydrated postprocedure in light of her baseline renal insufficiency and will continue sodium bicarbonate infusion which was commenced prior to the procedure.          ______________________________ Shelva Majestic,  M.D.     TK/MEDQ  D:  11/08/2010  T:  11/09/2010  Job:  [id number]  cc:   Halford Chessman, M.D.  Electronically Signed by Shelva Majestic M.D. on 11/23/2010 03:29:20 PM

## 2011-05-10 HISTORY — PX: NM MYOCAR PERF WALL MOTION: HXRAD629

## 2011-06-14 DIAGNOSIS — N952 Postmenopausal atrophic vaginitis: Secondary | ICD-10-CM | POA: Insufficient documentation

## 2012-02-22 ENCOUNTER — Other Ambulatory Visit (HOSPITAL_COMMUNITY): Payer: Self-pay | Admitting: Family Medicine

## 2012-02-22 DIAGNOSIS — Z139 Encounter for screening, unspecified: Secondary | ICD-10-CM

## 2012-02-28 ENCOUNTER — Ambulatory Visit (HOSPITAL_COMMUNITY)
Admission: RE | Admit: 2012-02-28 | Discharge: 2012-02-28 | Disposition: A | Discharge status: Home / Self Care | Payer: Medicare Other | Source: Ambulatory Visit | Attending: Family Medicine | Admitting: Family Medicine

## 2012-02-28 DIAGNOSIS — Z139 Encounter for screening, unspecified: Secondary | ICD-10-CM

## 2012-02-28 DIAGNOSIS — Z1231 Encounter for screening mammogram for malignant neoplasm of breast: Secondary | ICD-10-CM | POA: Insufficient documentation

## 2012-05-04 ENCOUNTER — Ambulatory Visit (INDEPENDENT_AMBULATORY_CARE_PROVIDER_SITE_OTHER): Payer: Medicare Other | Admitting: Urology

## 2012-05-04 DIAGNOSIS — N3289 Other specified disorders of bladder: Secondary | ICD-10-CM

## 2012-05-04 DIAGNOSIS — N390 Urinary tract infection, site not specified: Secondary | ICD-10-CM

## 2012-05-04 DIAGNOSIS — N952 Postmenopausal atrophic vaginitis: Secondary | ICD-10-CM

## 2012-09-24 ENCOUNTER — Ambulatory Visit (HOSPITAL_COMMUNITY)
Admission: RE | Admit: 2012-09-24 | Discharge: 2012-09-24 | Disposition: A | Discharge status: Home / Self Care | Payer: Medicare Other | Source: Ambulatory Visit | Attending: Internal Medicine | Admitting: Internal Medicine

## 2012-09-24 ENCOUNTER — Other Ambulatory Visit (HOSPITAL_COMMUNITY): Payer: Self-pay | Admitting: Internal Medicine

## 2012-09-24 DIAGNOSIS — M25551 Pain in right hip: Secondary | ICD-10-CM

## 2012-09-24 DIAGNOSIS — M25559 Pain in unspecified hip: Secondary | ICD-10-CM | POA: Insufficient documentation

## 2012-09-24 DIAGNOSIS — M533 Sacrococcygeal disorders, not elsewhere classified: Secondary | ICD-10-CM | POA: Insufficient documentation

## 2012-09-28 ENCOUNTER — Other Ambulatory Visit: Payer: Self-pay | Admitting: Cardiovascular Disease

## 2012-10-18 ENCOUNTER — Ambulatory Visit (HOSPITAL_COMMUNITY)
Admission: RE | Admit: 2012-10-18 | Discharge: 2012-10-18 | Disposition: A | Discharge status: Home / Self Care | Payer: Medicare Other | Source: Ambulatory Visit | Attending: Family Medicine | Admitting: Family Medicine

## 2012-10-18 ENCOUNTER — Other Ambulatory Visit (HOSPITAL_COMMUNITY): Payer: Self-pay | Admitting: Family Medicine

## 2012-10-18 DIAGNOSIS — M5137 Other intervertebral disc degeneration, lumbosacral region: Secondary | ICD-10-CM | POA: Insufficient documentation

## 2012-10-18 DIAGNOSIS — S338XXA Sprain of other parts of lumbar spine and pelvis, initial encounter: Secondary | ICD-10-CM

## 2012-10-18 DIAGNOSIS — M51379 Other intervertebral disc degeneration, lumbosacral region without mention of lumbar back pain or lower extremity pain: Secondary | ICD-10-CM | POA: Insufficient documentation

## 2012-10-18 DIAGNOSIS — IMO0002 Reserved for concepts with insufficient information to code with codable children: Secondary | ICD-10-CM | POA: Insufficient documentation

## 2012-10-18 DIAGNOSIS — M533 Sacrococcygeal disorders, not elsewhere classified: Secondary | ICD-10-CM | POA: Insufficient documentation

## 2012-10-18 DIAGNOSIS — W19XXXA Unspecified fall, initial encounter: Secondary | ICD-10-CM | POA: Insufficient documentation

## 2012-10-31 ENCOUNTER — Other Ambulatory Visit: Payer: Self-pay | Admitting: Cardiovascular Disease

## 2012-11-01 NOTE — Telephone Encounter (Signed)
Rx was sent to pharmacy electronically. 

## 2012-11-22 ENCOUNTER — Encounter (INDEPENDENT_AMBULATORY_CARE_PROVIDER_SITE_OTHER): Payer: Self-pay | Admitting: *Deleted

## 2012-12-20 ENCOUNTER — Telehealth: Payer: Self-pay | Admitting: Cardiovascular Disease

## 2012-12-20 ENCOUNTER — Ambulatory Visit (INDEPENDENT_AMBULATORY_CARE_PROVIDER_SITE_OTHER): Payer: Medicare Other | Admitting: Internal Medicine

## 2012-12-20 ENCOUNTER — Encounter (INDEPENDENT_AMBULATORY_CARE_PROVIDER_SITE_OTHER): Payer: Self-pay | Admitting: Internal Medicine

## 2012-12-20 ENCOUNTER — Other Ambulatory Visit (INDEPENDENT_AMBULATORY_CARE_PROVIDER_SITE_OTHER): Payer: Self-pay | Admitting: *Deleted

## 2012-12-20 ENCOUNTER — Telehealth (INDEPENDENT_AMBULATORY_CARE_PROVIDER_SITE_OTHER): Payer: Self-pay | Admitting: *Deleted

## 2012-12-20 VITALS — BP 130/60 | HR 60 | Ht 61.0 in | Wt 139.8 lb

## 2012-12-20 DIAGNOSIS — Z860100 Personal history of colon polyps, unspecified: Secondary | ICD-10-CM | POA: Insufficient documentation

## 2012-12-20 DIAGNOSIS — D649 Anemia, unspecified: Secondary | ICD-10-CM | POA: Insufficient documentation

## 2012-12-20 DIAGNOSIS — Z1211 Encounter for screening for malignant neoplasm of colon: Secondary | ICD-10-CM

## 2012-12-20 LAB — CBC WITH DIFFERENTIAL/PLATELET
Hemoglobin: 10.3 g/dL — ABNORMAL LOW (ref 12.0–15.0)
Lymphocytes Relative: 23 % (ref 12–46)
Lymphs Abs: 1 10*3/uL (ref 0.7–4.0)
MCH: 34.2 pg — ABNORMAL HIGH (ref 26.0–34.0)
Monocytes Relative: 12 % (ref 3–12)
Neutro Abs: 2.5 10*3/uL (ref 1.7–7.7)
Neutrophils Relative %: 60 % (ref 43–77)
Platelets: 173 10*3/uL (ref 150–400)
RBC: 3.01 MIL/uL — ABNORMAL LOW (ref 3.87–5.11)
WBC: 4.1 10*3/uL (ref 4.0–10.5)

## 2012-12-20 NOTE — Telephone Encounter (Addendum)
Patient needs movi prep -- nkda 

## 2012-12-20 NOTE — Telephone Encounter (Signed)
Yes, it is ok to stop plavix five days prior and asa two days prior to colonoscopy.  Restart medications immediately after the procedure if ok with GI MD.  Samara Snide, Lamir Racca 12:17 PM

## 2012-12-20 NOTE — Telephone Encounter (Signed)
Message forwarded to B. Hager, PA-C in Dr. Evette Georges absence.  Per Medical Records, [facility] pt.  Last OV note on Extender cart.

## 2012-12-20 NOTE — Progress Notes (Addendum)
Subjective:     Patient ID: [name], female   DOB: 12/11/1933, 79 y.o.   MRN: [id number]  HPI Referred to our office by Dr. Hilma Favors for heme positive stool. She tells me she actually did the hemoccult wrong. She tells me she is doing good. Appetite is okay. She has lost 32 pounds due to her pending divorce. She is teary this morning. She is going thru a divorce. He usually has a BM 2-3 times a day. Stool are light brown in color. She denies seeing any bright red rectal bleeding. She tells me she has fallen twice and has had xray's. She fell last week when it rained. No fractures. Colonoscopy in 2004.  Biopsy: Poly (Cecum) inflammatory polyp. No tumor seen. Polyp (Hepatic flexure) Inflamed, mildly hyperplastic polyp. No tumor seen. Marland Kitchen She was advised to have a colonoscopy in 6 yrs (Per patient)  Labs 02/13/2012 RBC 3.18, Iron 105, UIBC 137, TIBC 242L, %Sat 43,, Vitamin B12 1592, H and H 10.5 and 32.0p, MCV 100.6, Platelet ct 153  03/28/2003 Colonoscopy with polypectomy:  FINAL DIAGNOSES:  1. Examination performed to the cecum.  2. Two small polyps. The one from the cecum was ablated via cold biopsy and  the other one from the hepatic flexure was snared. This was a flat polyp  about 8 to 10 mm in maximal diameter.  I She is divorced. She is retired from [employer] She has 4 children. All in good health except one.  One daughter is addicted to pain pills.  Review of Systems Current Outpatient Prescriptions  Medication Sig Dispense Refill  . ALPRAZolam (XANAX) 0.5 MG tablet Take 0.5 mg by mouth at bedtime as needed for sleep.      Marland Kitchen amLODipine (NORVASC) 5 MG tablet Take 5 mg by mouth daily.      Marland Kitchen aspirin 81 MG tablet Take 81 mg by mouth daily.      . carvedilol (COREG) 25 MG tablet Take 25 mg by mouth 2 (two) times daily with a meal.      . clopidogrel (PLAVIX) 75 MG tablet TAKE ONE TABLET BY MOUTH ONCE DAILY WITH A MEAL  30 tablet  6  . isosorbide mononitrate (IMDUR) 120 MG 24 hr  tablet Take 120 mg by mouth daily.      Marland Kitchen triamcinolone (KENALOG) 0.025 % cream Apply topically 2 (two) times daily.      . vitamin B-12 (CYANOCOBALAMIN) 250 MCG tablet Take 2,500 mcg by mouth daily.       No current facility-administered medications for this visit.   Current Outpatient Prescriptions on File Prior to Visit  Medication Sig Dispense Refill  . clopidogrel (PLAVIX) 75 MG tablet TAKE ONE TABLET BY MOUTH ONCE DAILY WITH A MEAL  30 tablet  6   No current facility-administered medications on file prior to visit.   Past Medical History  Diagnosis Date  . Hypertension   . High cholesterol   . Hypertension     x 5-6 yrs.   Past Surgical History  Procedure Laterality Date  . Partial hysterectomy    . Breast masses      removed while she was pregnant  . Coronary angioplasty with stent placement       x 3-5 yrs for a blockage   No Known Allergies      Objective:   Physical Exam  Filed Vitals:   12/20/12 0952  BP: 130/60  Pulse: 60  Height: 5\' 1"  (1.549 m)  Weight: 139  lb 12.8 oz (63.413 kg)   Alert and oriented. Skin warm and dry. Oral mucosa is moist.   . Sclera anicteric, conjunctivae is pink. Thyroid not enlarged. No cervical lymphadenopathy. Lungs clear. Heart regular rate and rhythm.  Abdomen is soft. Bowel sounds are positive. No hepatomegaly. No abdominal masses felt. No tenderness.  No edema to lower extremities.      Assessment:   Anemia Slight Colonic neoplasm needs to be ruled. Hx of colon polyp,. Last colonoscopy in 2004.     Plan:    Colonoscopy. CBC today.

## 2012-12-20 NOTE — Telephone Encounter (Signed)
Letter drafted and sent to Dr. Olevia Perches In [place].    Returned call to [place] and informed.  Verbalized understanding.

## 2012-12-20 NOTE — Telephone Encounter (Signed)
Having Colonoscopy on 01-16-13. Wants to know if she can stop her Plavix 5 days prior and aspirin 2 days prior to her procedure?

## 2012-12-20 NOTE — Patient Instructions (Signed)
Colonoscopy. CBC

## 2012-12-21 ENCOUNTER — Telehealth (INDEPENDENT_AMBULATORY_CARE_PROVIDER_SITE_OTHER): Payer: Self-pay | Admitting: Internal Medicine

## 2012-12-21 DIAGNOSIS — Z1211 Encounter for screening for malignant neoplasm of colon: Secondary | ICD-10-CM

## 2012-12-21 MED ORDER — PEG-KCL-NACL-NASULF-NA ASC-C 100 G PO SOLR: 1.0000 | Freq: Once | ORAL | Status: DC

## 2012-12-21 NOTE — Telephone Encounter (Signed)
Patient wants osmo pill prep instead movi prep

## 2012-12-24 MED ORDER — SOD PHOS MONO-SOD PHOS DIBASIC 1.102-0.398 G PO TABS: 1.0000 | ORAL_TABLET | Freq: Once | ORAL | Status: DC

## 2012-12-27 ENCOUNTER — Encounter (INDEPENDENT_AMBULATORY_CARE_PROVIDER_SITE_OTHER): Payer: Self-pay

## 2013-01-01 ENCOUNTER — Encounter (HOSPITAL_COMMUNITY): Payer: Self-pay | Admitting: Pharmacy Technician

## 2013-01-14 ENCOUNTER — Encounter (INDEPENDENT_AMBULATORY_CARE_PROVIDER_SITE_OTHER): Payer: Self-pay | Admitting: *Deleted

## 2013-01-16 ENCOUNTER — Ambulatory Visit (HOSPITAL_COMMUNITY)
Admission: RE | Admit: 2013-01-16 | Discharge: 2013-01-16 | Disposition: A | Discharge status: Home / Self Care | Payer: Medicare Other | Source: Ambulatory Visit | Attending: Internal Medicine | Admitting: Internal Medicine

## 2013-01-16 ENCOUNTER — Encounter (HOSPITAL_COMMUNITY): Payer: Self-pay | Admitting: *Deleted

## 2013-01-16 ENCOUNTER — Encounter (HOSPITAL_COMMUNITY)
Admission: RE | Disposition: A | Discharge status: Home / Self Care | Payer: Self-pay | Source: Ambulatory Visit | Attending: Internal Medicine

## 2013-01-16 DIAGNOSIS — Z86010 Personal history of colonic polyps: Secondary | ICD-10-CM

## 2013-01-16 DIAGNOSIS — K5730 Diverticulosis of large intestine without perforation or abscess without bleeding: Secondary | ICD-10-CM | POA: Insufficient documentation

## 2013-01-16 DIAGNOSIS — K644 Residual hemorrhoidal skin tags: Secondary | ICD-10-CM | POA: Insufficient documentation

## 2013-01-16 DIAGNOSIS — I10 Essential (primary) hypertension: Secondary | ICD-10-CM | POA: Insufficient documentation

## 2013-01-16 DIAGNOSIS — K921 Melena: Secondary | ICD-10-CM | POA: Insufficient documentation

## 2013-01-16 DIAGNOSIS — D649 Anemia, unspecified: Secondary | ICD-10-CM

## 2013-01-16 HISTORY — DX: Anemia, unspecified: D64.9

## 2013-01-16 HISTORY — DX: Chronic kidney disease, unspecified: N18.9

## 2013-01-16 HISTORY — DX: Polyp of colon: K63.5

## 2013-01-16 HISTORY — PX: COLONOSCOPY: SHX5424

## 2013-01-16 SURGERY — COLONOSCOPY
Anesthesia: Moderate Sedation

## 2013-01-16 MED ORDER — MEPERIDINE HCL 50 MG/ML IJ SOLN
INTRAMUSCULAR | Status: AC
  Filled 2013-01-16: qty 1

## 2013-01-16 MED ORDER — CIPROFLOXACIN HCL 250 MG PO TABS: 500.0000 mg | ORAL_TABLET | Freq: Two times a day (BID) | ORAL | Status: DC

## 2013-01-16 MED ORDER — MIDAZOLAM HCL 5 MG/5ML IJ SOLN
INTRAMUSCULAR | Status: DC | PRN
  Administered 2013-01-16: 1 mg via INTRAVENOUS
  Administered 2013-01-16 (×2): 2 mg via INTRAVENOUS

## 2013-01-16 MED ORDER — METRONIDAZOLE 500 MG PO TABS: 500.0000 mg | ORAL_TABLET | Freq: Two times a day (BID) | ORAL | Status: DC

## 2013-01-16 MED ORDER — NYSTATIN-TRIAMCINOLONE 100000-0.1 UNIT/GM-% EX CREA: TOPICAL_CREAM | Freq: Two times a day (BID) | CUTANEOUS | Status: DC

## 2013-01-16 MED ORDER — MIDAZOLAM HCL 5 MG/5ML IJ SOLN
INTRAMUSCULAR | Status: AC
  Filled 2013-01-16: qty 10

## 2013-01-16 MED ORDER — MEPERIDINE HCL 50 MG/ML IJ SOLN
INTRAMUSCULAR | Status: DC | PRN
  Administered 2013-01-16 (×2): 25 mg via INTRAVENOUS

## 2013-01-16 MED ORDER — SODIUM CHLORIDE 0.9 % IV SOLN
INTRAVENOUS | Status: DC
  Administered 2013-01-16: 11:00:00 via INTRAVENOUS

## 2013-01-16 NOTE — H&P (Signed)
[name] is an 79 y.o. female.   Chief Complaint: Patient is in for colonoscopy. HPI: Patient is 79 year old Caucasian female who recently did Hemoccults. The first one was positive and the other 2 negative. She she believes the first one was false positive because she did not follow the instructions. She denies abdominal pain change in bowel habits or rectal bleeding. She was recently found to be anemic. Patient's last colonoscopy was 10 years ago with removal of 2 polyps and these were hyperplastic. Family history significant for colon cancer in 2 maternal uncles for them to 53s at the time of diagnosis. Mother died of metastatic stomach cancer and father had liver cancer(? Primary0.  Past Medical History  Diagnosis Date  . Hypertension   . High cholesterol   . Hypertension     x 5-6 yrs.  . Colon polyps   . Anemia   . Chronic kidney disease     Past Surgical History  Procedure Laterality Date  . Partial hysterectomy    . Breast masses      removed while she was pregnant  . Coronary angioplasty with stent placement       x 3-5 yrs for a blockage    Family History  Problem Relation Age of Onset  . Colon cancer Neg Hx    Social History:  reports that she has never smoked. She does not have any smokeless tobacco history on file. She reports that she does not drink alcohol or use illicit drugs.  Allergies: No Known Allergies  Medications Prior to Admission  Medication Sig Dispense Refill  . ALPRAZolam (XANAX) 0.5 MG tablet Take 0.5 mg by mouth at bedtime as needed for sleep.      Marland Kitchen amLODipine (NORVASC) 5 MG tablet Take 5 mg by mouth daily.      Marland Kitchen aspirin 81 MG tablet Take 81 mg by mouth daily.      . carvedilol (COREG) 25 MG tablet Take 25 mg by mouth 2 (two) times daily with a meal.      . clopidogrel (PLAVIX) 75 MG tablet TAKE ONE TABLET BY MOUTH ONCE DAILY WITH A MEAL  30 tablet  6  . FLAXSEED, LINSEED, PO Take 1 capsule by mouth daily.      . isosorbide mononitrate  (IMDUR) 120 MG 24 hr tablet Take 120 mg by mouth daily.      . sodium phosphates (OSMOPREP) 1.102-0.398 G TABS Take 1 tablet by mouth once.  32 tablet  0  . triamcinolone (KENALOG) 0.025 % cream Apply topically 2 (two) times daily.      . vitamin B-12 (CYANOCOBALAMIN) 250 MCG tablet Take 2,500 mcg by mouth daily.        No results found for this or any previous visit (from the past 48 hour(s)). No results found.  ROS  Blood pressure 177/77, pulse 65, temperature 98.6 F (37 C), temperature source Oral, resp. rate 20, height 5\' 1"  (1.549 m), weight 139 lb (63.05 kg), SpO2 98.00%. Physical Exam  Constitutional: She appears well-developed and well-nourished.  Eyes: Conjunctivae are normal. No scleral icterus.  Neck: No thyromegaly present.  Cardiovascular: Normal rate, regular rhythm and normal heart sounds.   No murmur heard. Respiratory: Effort normal and breath sounds normal.  GI: Soft. She exhibits no distension and no mass. There is no tenderness.  Musculoskeletal: She exhibits no edema.  Lymphadenopathy:    She has no cervical adenopathy.  Neurological: She is alert.  Skin: Skin is warm and  dry.     Assessment/Plan Heme-positive stool. Anemia. History of hyperplastic polyps. Diagnostic colonoscopy.  [name],[name] 01/16/2013, 11:16 AM

## 2013-01-16 NOTE — Progress Notes (Deleted)
COLONOSCOPY PROCEDURE REPORT  PATIENT:  [name]  MR#:  [id number] Birthdate:  July 14, 1933, 79 y.o., female Endoscopist:  Dr. Rogene Houston, MD Referred By:  Dr. Purvis Kilts, MD  Procedure Date: 01/16/2013  Procedure:   Colonoscopy  Indications:  Patient is 79 year old Caucasian female who is undergoing diagnostic colonoscopy. She did 3 Hemoccults in one was positive. She believed was done incorrectly. She was also found to be anemic recently. She denies melena or rectal bleeding. Patient's last colonoscopy was in 2004 with removal of 2 polyps which were hyperplastic. Family history significant for colonic carcinoma in 2 maternal uncles.  Informed Consent:  The procedure and risks were reviewed with the patient and informed consent was obtained.  Medications:  Demerol 50 mg IV Versed 5 mg IV  Description of procedure:  After a digital rectal exam was performed, that colonoscope was advanced from the anus through the rectum and colon to the area of the cecum, ileocecal valve and appendiceal orifice. The cecum was deeply intubated. These structures were well-seen and photographed for the record. From the level of the cecum and ileocecal valve, the scope was slowly and cautiously withdrawn. The mucosal surfaces were carefully surveyed utilizing scope tip to flexion to facilitate fold flattening as needed. The scope was pulled down into the rectum where a thorough exam including retroflexion was performed.  Findings:   Prep excellent. Small lipoma noted at ascending colon. Multiple diverticula at sigmoid colon and one covered with mucopurulent material. Erythema and edema noted to the surrounding mucosa. Normal rectal mucosa. Small hemorrhoids below the dentate line.   Therapeutic/Diagnostic Maneuvers Performed:  none  Complications:  None  Cecal Withdrawal Time:  8 minutes  Impression:  Examination performed to cecum. Multiple diverticula at sigmoid colon along with  changes of diverticulitis involving one diverticulum. Small external hemorrhoids.  Recommendations:  Standard instructions given. Cipro 500 mg by mouth twice a day for 10 days. Metronidazole 500 mg by mouth twice a day for 10 days. Mycolog-II cream to be applied to anal canal twice a day for 2 weeks and then when necessary. Office visit in 8 weeks.  Naomie Crow U  01/16/2013 11:53 AM  CC: Dr. Hilma Favors, Betsy Coder, MD & Dr. Rayne Du ref. provider found

## 2013-01-18 ENCOUNTER — Encounter (HOSPITAL_COMMUNITY): Payer: Self-pay | Admitting: Internal Medicine

## 2013-01-21 NOTE — Op Note (Signed)
PATIENTAMRUTHA Love MR#: [id number]  Birthdate: [birth year], 79 y.o., female  Endoscopist: Dr. Rogene Houston, MD  Referred By: Dr. Purvis Kilts, MD  Procedure Date: 01/16/2013  Procedure: Colonoscopy  Indications: Patient is 79 year old Caucasian female who is undergoing diagnostic colonoscopy. She did 3 Hemoccults in one was positive. She believed was done incorrectly. She was also found to be anemic recently. She denies melena or rectal bleeding. Patient's last colonoscopy was in 2004 with removal of 2 polyps which were hyperplastic.  Family history significant for colonic carcinoma in 2 maternal uncles.  Informed Consent: The procedure and risks were reviewed with the patient and informed consent was obtained.  Medications:  Demerol 50 mg IV  Versed 5 mg IV  Description of procedure: After a digital rectal exam was performed, that colonoscope was advanced from the anus through the rectum and colon to the area of the cecum, ileocecal valve and appendiceal orifice. The cecum was deeply intubated. These structures were well-seen and photographed for the record. From the level of the cecum and ileocecal valve, the scope was slowly and cautiously withdrawn. The mucosal surfaces were carefully surveyed utilizing scope tip to flexion to facilitate fold flattening as needed. The scope was pulled down into the rectum where a thorough exam including retroflexion was performed.  Findings:  Prep excellent.  Small lipoma noted at ascending colon.  Multiple diverticula at sigmoid colon and one covered with mucopurulent material. Erythema and edema noted to the surrounding mucosa.  Normal rectal mucosa.  Small hemorrhoids below the dentate line.  Therapeutic/Diagnostic Maneuvers Performed: none  Complications: None  Cecal Withdrawal Time: 8 minutes  Impression:  Examination performed to cecum.  Multiple diverticula at sigmoid colon along with changes of diverticulitis involving one diverticulum.   Small external hemorrhoids.  Recommendations:  Standard instructions given.  Cipro 500 mg by mouth twice a day for 10 days.  Metronidazole 500 mg by mouth twice a day for 10 days.  Mycolog-II cream to be applied to anal canal twice a day for 2 weeks and then when necessary.  Office visit in 8 weeks.  [name] 01/16/2013 11:53 AM  CC: Dr. Hilma Favors, Betsy Coder, MD & Dr. Rayne Du ref. provider found

## 2013-02-06 DIAGNOSIS — R040 Epistaxis: Secondary | ICD-10-CM

## 2013-02-06 HISTORY — DX: Epistaxis: R04.0

## 2013-02-08 ENCOUNTER — Telehealth: Payer: Self-pay | Admitting: Cardiovascular Disease

## 2013-02-08 NOTE — Telephone Encounter (Signed)
Ok to stop aspirin short term until bleeding stops.  If it does not, I would stop plavix for five days and then restart.  Also send this to [name].  Christopherjohn Schiele 5:43 PM

## 2013-02-08 NOTE — Telephone Encounter (Signed)
Chart left for Gaspar Bidding to review. Lab work not yet obtained.

## 2013-02-08 NOTE — Telephone Encounter (Signed)
Having bad nose bleeds and would like to come off of some of her medications.. Please Call   Thanks

## 2013-02-08 NOTE — Telephone Encounter (Signed)
Spoke with patient. Having nose bleeds off and on for 3 weeks (thick blood) - has have 4 bad nosebleeds recently. Took self off aspirin, still taking Plavix, reported started taking iron again. Reports blood pressure seems lower than normal (114/48) but will increase during the day and that HR is in the 60s. Denies fatigue, CP, weakness, SOB. Had labs drawn at Dr. Delanna Ahmadi office on Wednesday. Was wanting to know about coming off medications - not sure which as not on any anticoags. Will call Dr. Delanna Ahmadi office to obtain labwork. Please advise.

## 2013-02-11 NOTE — Telephone Encounter (Signed)
Message sent to both Dr. Evette Georges and My [facility]. Will defer message to him. He needs to make the decision.

## 2013-02-11 NOTE — Telephone Encounter (Signed)
Stopped ASA Friday and decreased Plavix (on her own) to 1/2 tablet daily.  Nose quit bleeding Friday PM but feels she lost a lot of blood.  States this is her 4th nose bleed in one month; 3 significant and 1 minor.  Told to restart 75mg  plavix daily and will let [name] know of incident.  Appt. W/ENT Thursday.  Voiced understanding.

## 2013-02-12 NOTE — Telephone Encounter (Signed)
Pull chart for me to see tomorrow in office

## 2013-02-14 ENCOUNTER — Ambulatory Visit (INDEPENDENT_AMBULATORY_CARE_PROVIDER_SITE_OTHER): Payer: Medicare Other | Admitting: Otolaryngology

## 2013-02-14 DIAGNOSIS — R040 Epistaxis: Secondary | ICD-10-CM

## 2013-02-21 ENCOUNTER — Encounter (HOSPITAL_COMMUNITY): Payer: Medicare Other | Attending: Hematology and Oncology

## 2013-02-21 ENCOUNTER — Encounter (HOSPITAL_COMMUNITY): Payer: Self-pay

## 2013-02-21 VITALS — BP 181/73 | HR 76 | Temp 98.2°F | Resp 16 | Wt 138.9 lb

## 2013-02-21 DIAGNOSIS — D649 Anemia, unspecified: Secondary | ICD-10-CM

## 2013-02-21 DIAGNOSIS — D72819 Decreased white blood cell count, unspecified: Secondary | ICD-10-CM | POA: Insufficient documentation

## 2013-02-21 DIAGNOSIS — D539 Nutritional anemia, unspecified: Secondary | ICD-10-CM

## 2013-02-21 DIAGNOSIS — N189 Chronic kidney disease, unspecified: Secondary | ICD-10-CM

## 2013-02-21 LAB — CBC WITH DIFFERENTIAL/PLATELET
Eosinophils Absolute: 0.2 10*3/uL (ref 0.0–0.7)
Hemoglobin: 11.5 g/dL — ABNORMAL LOW (ref 12.0–15.0)
Lymphocytes Relative: 23 % (ref 12–46)
Lymphs Abs: 1 10*3/uL (ref 0.7–4.0)
MCH: 34.2 pg — ABNORMAL HIGH (ref 26.0–34.0)
Monocytes Relative: 10 % (ref 3–12)
Neutro Abs: 2.8 10*3/uL (ref 1.7–7.7)
Neutrophils Relative %: 64 % (ref 43–77)
Platelets: 182 10*3/uL (ref 150–400)
RBC: 3.36 MIL/uL — ABNORMAL LOW (ref 3.87–5.11)
WBC: 4.3 10*3/uL (ref 4.0–10.5)

## 2013-02-21 LAB — IRON AND TIBC
Saturation Ratios: 29 % (ref 20–55)
UIBC: 200 ug/dL (ref 125–400)

## 2013-02-21 LAB — RETICULOCYTES
RBC.: 3.36 MIL/uL — ABNORMAL LOW (ref 3.87–5.11)
Retic Count, Absolute: 50.4 10*3/uL (ref 19.0–186.0)
Retic Ct Pct: 1.5 % (ref 0.4–3.1)

## 2013-02-21 LAB — FERRITIN: Ferritin: 30 ng/mL (ref 10–291)

## 2013-02-21 LAB — DIRECT ANTIGLOBULIN TEST (NOT AT ARMC)
DAT, IgG: NEGATIVE
DAT, complement: NEGATIVE

## 2013-02-21 LAB — HAPTOGLOBIN: Haptoglobin: 156 mg/dL (ref 45–215)

## 2013-02-21 NOTE — Progress Notes (Signed)
[facility] NEW PATIENT EVALUATION   Name: [name] Date: 02/21/2013 MRN: [id number] DOB: [birth year]  PCP: Purvis Kilts, MD   REFERRING PHYSICIAN: Halford Chessman, MD  REASON FOR REFERRAL: Macrocytic anemia with leukopenia     HISTORY OF PRESENT ILLNESS:[name] is a 79 y.o. female who is referred for evaluation of macrocytic anemia. She is currently undergoing a divorce and as had a change in her eating habits. She denies any nausea, vomiting, melena, hematochezia, hematuria, or vaginal bleeding. She does bruise easily and does have episodes of epistaxis. She denies any diarrhea, constipation, cough, wheezing, PND, orthopnea, palpitations, or any significant change in her energy level. She was found to be anemic in July of 2014 and is here today for evaluation. She had a colonoscopy within the last month which showed evidence of an infectious process but after reading the side effects of Flagyl, the patient decided not to take it. She was asymptomatic at that time and it was a routine colonoscopy.   PAST MEDICAL HISTORY:  has a past medical history of Hypertension; High cholesterol; Hypertension; Colon polyps; Anemia; and Chronic kidney disease.     PAST SURGICAL HISTORY: Past Surgical History  Procedure Laterality Date  . Partial hysterectomy    . Breast masses      removed while she was pregnant  . Coronary angioplasty with stent placement       x 3-5 yrs for a blockage  . Colonoscopy N/A 01/16/2013    Procedure: COLONOSCOPY;  Surgeon: Rogene Houston, MD;  [facility];  Service: Endoscopy;  Laterality: N/A;  1200     CURRENT MEDICATIONS: has a current medication list which includes the following prescription(s): alprazolam, amlodipine, clopidogrel, flaxseed (linseed), ferrous gluconate, isosorbide mononitrate, nystatin-triamcinolone, vitamin b-12, aspirin, carvedilol, metronidazole, and triamcinolone.   ALLERGIES: Review of  patient's allergies indicates no known allergies.   SOCIAL HISTORY:  reports that she has never smoked. She does not have any smokeless tobacco history on file. She reports that she does not drink alcohol or use illicit drugs.   FAMILY HISTORY: family history is negative for Colon cancer.    REVIEW OF SYSTEMS:  Other than that discussed above is noncontributory.    PHYSICAL EXAM:  weight is 138 lb 14.4 oz (63.005 kg). Her oral temperature is 98.2 F (36.8 C). Her blood pressure is 181/73 and her pulse is 76. Her respiration is 16.    GENERAL:alert, no distress and comfortable SKIN: skin color, texture, turgor are normal, no rashes or significant lesions EYES: normal, Conjunctiva are pink and non-injected, sclera clear OROPHARYNX:no exudate, no erythema and lips, buccal mucosa, and tongue normal  NECK: supple, thyroid normal size, non-tender, without nodularity CHEST: Normal AP diameter with no breast masses. LYMPH:  no palpable lymphadenopathy in the cervical, axillary or inguinal LUNGS: clear to auscultation and percussion with normal breathing effort HEART: regular rate & rhythm and no murmurs ABDOMEN:abdomen soft, non-tender and normal bowel sounds MUSCULOSKELETALl:no cyanosis of digits, no clubbing or edema  NEURO: alert & oriented x 3 with fluent speech, no focal motor/sensory deficits    LABORATORY DATA:  Office Visit on 02/21/2013  Component Date Value Range Status  . WBC 02/21/2013 4.3  4.0 - 10.5 K/uL Final  . RBC 02/21/2013 3.36* 3.87 - 5.11 MIL/uL Final  . Hemoglobin 02/21/2013 11.5* 12.0 - 15.0 g/dL Final  . HCT 02/21/2013 34.9* 36.0 - 46.0 % Final  . MCV 02/21/2013 103.9* 78.0 -  100.0 fL Final  . MCH 02/21/2013 34.2* 26.0 - 34.0 pg Final  . MCHC 02/21/2013 33.0  30.0 - 36.0 g/dL Final  . RDW 02/21/2013 12.5  11.5 - 15.5 % Final  . Platelets 02/21/2013 182  150 - 400 K/uL Final  . Neutrophils Relative % 02/21/2013 64  43 - 77 % Final  . Neutro Abs 02/21/2013  2.8  1.7 - 7.7 K/uL Final  . Lymphocytes Relative 02/21/2013 23  12 - 46 % Final  . Lymphs Abs 02/21/2013 1.0  0.7 - 4.0 K/uL Final  . Monocytes Relative 02/21/2013 10  3 - 12 % Final  . Monocytes Absolute 02/21/2013 0.4  0.1 - 1.0 K/uL Final  . Eosinophils Relative 02/21/2013 4  0 - 5 % Final  . Eosinophils Absolute 02/21/2013 0.2  0.0 - 0.7 K/uL Final  . Basophils Relative 02/21/2013 1  0 - 1 % Final  . Basophils Absolute 02/21/2013 0.0  0.0 - 0.1 K/uL Final  . Retic Ct Pct 02/21/2013 1.5  0.4 - 3.1 % Final  . RBC. 02/21/2013 3.36* 3.87 - 5.11 MIL/uL Final  . Retic Count, Manual 02/21/2013 50.4  19.0 - 186.0 K/uL Final    Urinalysis No results found for this basename: colorurine,  appearanceur,  labspec,  phurine,  glucoseu,  hgbur,  bilirubinur,  ketonesur,  proteinur,  urobilinogen,  nitrite,  leukocytesur      @RADIOGRAPHY : Mammogram done in October of 2013 was normal.  PATHOLOGY: Peripheral smear shows slight macrocytosis with no hypersegmented polys and no basophilic stippling.   IMPRESSION: #1. Macrocytic anemia, either primary bone marrow disorder,  nutritional deficiency versus low-grade hemolysis with compensation. #2. History of colon polyps, none found on most recent colonoscopy. #3. Hypertension, controlled. #4. Coronary artery disease with stent placement, currently on Plavix.   PLAN: #1. Additional lab tests were done today to help clarify etiology of anemia. #2. The patient was reassured. #3. Followup in 2 weeks.  I appreciate the opportunity of sharing in her care.   Doroteo Bradford, MD 02/21/2013 11:39 AM

## 2013-02-21 NOTE — Patient Instructions (Signed)
[facility] Discharge Instructions  RECOMMENDATIONS MADE BY THE CONSULTANT AND ANY TEST RESULTS WILL BE SENT TO YOUR REFERRING PHYSICIAN.  EXAM FINDINGS BY THE PHYSICIAN TODAY AND SIGNS OR SYMPTOMS TO REPORT TO CLINIC OR PRIMARY PHYSICIAN: Exam and findings as discussed by Dr. Barnet Glasgow.  INSTRUCTIONS/FOLLOW-UP: 1.  Please return in 2 weeks so that Dr. Barnet Glasgow can discuss your labs and future care.  You may be contacted beforehand with lab results as they come in.  Please feel free to contact our clinic sooner for questions and concerns.  Thank you for choosing [facility] to provide your oncology and hematology care.  To afford each patient quality time with our providers, please arrive at least 15 minutes before your scheduled appointment time.  With your help, our goal is to use those 15 minutes to complete the necessary work-up to ensure our physicians have the information they need to help with your evaluation and healthcare recommendations.    Effective January 1st, 2014, we ask that you re-schedule your appointment with our physicians should you arrive 10 or more minutes late for your appointment.  We strive to give you quality time with our providers, and arriving late affects you and other patients whose appointments are after yours.    Again, thank you for choosing [facility].  Our hope is that these requests will decrease the amount of time that you wait before being seen by our physicians.       _____________________________________________________________  Should you have questions after your visit to [facility], please contact our office at [phone] between the hours of 8:30 a.m. and 5:00 p.m.  Voicemails left after 4:30 p.m. will not be returned until the following business day.  For prescription refill requests, have your pharmacy contact our office with your prescription refill request.

## 2013-02-22 LAB — ANA: Anti Nuclear Antibody(ANA): NEGATIVE

## 2013-02-26 LAB — FOLATE RBC: RBC Folate: 1685 ng/mL — ABNORMAL HIGH (ref >=366)

## 2013-03-02 DIAGNOSIS — D509 Iron deficiency anemia, unspecified: Secondary | ICD-10-CM | POA: Insufficient documentation

## 2013-03-07 ENCOUNTER — Encounter (HOSPITAL_COMMUNITY): Payer: Self-pay

## 2013-03-07 ENCOUNTER — Encounter (HOSPITAL_BASED_OUTPATIENT_CLINIC_OR_DEPARTMENT_OTHER): Payer: Medicare Other

## 2013-03-07 VITALS — BP 176/70 | HR 80 | Temp 97.9°F | Resp 16 | Wt 137.0 lb

## 2013-03-07 DIAGNOSIS — D509 Iron deficiency anemia, unspecified: Secondary | ICD-10-CM

## 2013-03-07 DIAGNOSIS — N189 Chronic kidney disease, unspecified: Secondary | ICD-10-CM

## 2013-03-07 NOTE — Addendum Note (Signed)
Addended by: Mellissa Kohut on: 03/07/2013 06:09 PM   Modules accepted: Orders

## 2013-03-07 NOTE — Progress Notes (Signed)
[name]  OFFICE PROGRESS NOTE  Purvis Kilts, MD [id number] [place] [address]  DIAGNOSIS: Anemia, iron deficiency  Chronic kidney disease  Chief Complaint  Patient presents with  . Anemia    macrocytic    CURRENT THERAPY: Oral iron supplements.  INTERVAL HISTORY: [name] 79 y.o. female returns for followup of macrocytic anemia while taking iron tablets.  Patient developed a left-sided nosebleed and was seen by Dr. Benjamine Mola will cauterize the area in her left nose but there is a higher upper area that was not treated. She had bled for about 40 minutes and because of that, stopped Plavix. He's had no bleeding since. She also stopped to other cardiac drugs which he intends to reinstitute today. She denies any nausea, vomiting, dark urine, lower extremity swelling or redness, chest pain, PND, orthopnea, palpitations, headache, or seizures. She also denies any melena, hematochezia, hematuria, vaginal bleeding, or hemoptysis.  MEDICAL HISTORY: Past Medical History  Diagnosis Date  . Hypertension   . High cholesterol   . Hypertension     x 5-6 yrs.  . Colon polyps   . Anemia   . Chronic kidney disease   . Bleeding from the nose Oct 2014    INTERIM HISTORY: has Anemia macrocytic; Personal history of colonic polyps; and Anemia, iron deficiency on her problem list.    ALLERGIES:  has No Known Allergies.  MEDICATIONS: has a current medication list which includes the following prescription(s): alprazolam, amlodipine, aspirin, flaxseed (linseed), multivitamin, vitamin b-12, carvedilol, clopidogrel, ferrous gluconate, isosorbide mononitrate, metronidazole, nystatin-triamcinolone, and triamcinolone.  SURGICAL HISTORY:  Past Surgical History  Procedure Laterality Date  . Partial hysterectomy    . Breast masses      removed while she was pregnant  . Coronary angioplasty with stent placement       x  3-5 yrs for a blockage  . Colonoscopy N/A 01/16/2013    Procedure: COLONOSCOPY;  Surgeon: Rogene Houston, MD;  Location[facility];  Service: Endoscopy;  Laterality: N/A;  1200    FAMILY HISTORY: family history is negative for Colon cancer.  SOCIAL HISTORY:  reports that she has never smoked. She does not have any smokeless tobacco history on file. She reports that she does not drink alcohol or use illicit drugs.  REVIEW OF SYSTEMS:  Other than that discussed above is noncontributory.  PHYSICAL EXAMINATION: ECOG PERFORMANCE STATUS: 1 - Symptomatic but completely ambulatory  Blood pressure 176/70, pulse 80, temperature 97.9 F (36.6 C), temperature source Oral, resp. rate 16, weight 137 lb (62.143 kg).  GENERAL:alert, no distress and comfortable SKIN: skin color, texture, turgor are normal, no rashes or significant lesions EYES: PERLA; Conjunctiva are pink and non-injected, sclera clear NOSE: No evidence of active bleeding at this time. OROPHARYNX:no exudate, no erythema on lips, buccal mucosa, or tongue. NECK: supple, thyroid normal size, non-tender, without nodularity. No masses CHEST: Normal AP diameter with no breast masses. LYMPH:  no palpable lymphadenopathy in the cervical, axillary or inguinal LUNGS: clear to auscultation and percussion with normal breathing effort HEART: regular rate & rhythm and no murmurs and no lower extremity edema ABDOMEN:abdomen soft, non-tender and normal bowel sounds MUSCULOSKELETAL:no cyanosis of digits and no clubbing. Range of motion normal.  NEURO: alert & oriented x 3 with fluent speech, no focal motor/sensory deficits   LABORATORY DATA: Office Visit on 02/21/2013  Component Date Value Range Status  .  ANA 02/21/2013 NEGATIVE  NEGATIVE Final   Performed at [facility]  . Erythropoietin 02/21/2013 19.1* 2.6 - 18.5 mIU/mL Final   Comment: (NOTE)                          Because of diurnal variations in Erythropoietin levels, it is                           important to collect samples at a consistent time of day.  Morning                          samples collected between 7:30 AM and 12:00 noon are recommended.                          Performed at [facility]  . WBC 02/21/2013 4.3  4.0 - 10.5 K/uL Final  . RBC 02/21/2013 3.36* 3.87 - 5.11 MIL/uL Final  . Hemoglobin 02/21/2013 11.5* 12.0 - 15.0 g/dL Final  . HCT 02/21/2013 34.9* 36.0 - 46.0 % Final  . MCV 02/21/2013 103.9* 78.0 - 100.0 fL Final  . MCH 02/21/2013 34.2* 26.0 - 34.0 pg Final  . MCHC 02/21/2013 33.0  30.0 - 36.0 g/dL Final  . RDW 02/21/2013 12.5  11.5 - 15.5 % Final  . Platelets 02/21/2013 182  150 - 400 K/uL Final  . Neutrophils Relative % 02/21/2013 64  43 - 77 % Final  . Neutro Abs 02/21/2013 2.8  1.7 - 7.7 K/uL Final  . Lymphocytes Relative 02/21/2013 23  12 - 46 % Final  . Lymphs Abs 02/21/2013 1.0  0.7 - 4.0 K/uL Final  . Monocytes Relative 02/21/2013 10  3 - 12 % Final  . Monocytes Absolute 02/21/2013 0.4  0.1 - 1.0 K/uL Final  . Eosinophils Relative 02/21/2013 4  0 - 5 % Final  . Eosinophils Absolute 02/21/2013 0.2  0.0 - 0.7 K/uL Final  . Basophils Relative 02/21/2013 1  0 - 1 % Final  . Basophils Absolute 02/21/2013 0.0  0.0 - 0.1 K/uL Final  . DAT, complement 02/21/2013 NEG   Final  . DAT, IgG 02/21/2013    Final                   Value:NEG                         Performed at [facility]  . Ferritin 02/21/2013 30  10 - 291 ng/mL Final   Performed at [facility]  . RBC Folate 02/21/2013 1685* >=366 ng/mL Final   Comment: Reference range not established for pediatric patients.                          Performed at [facility]  . Haptoglobin 02/21/2013 156  45 - 215 mg/dL Final   Performed at [facility]  . Iron 02/21/2013 81  42 - 135 ug/dL Final  . TIBC 02/21/2013 281  250 - 470 ug/dL Final  . Saturation Ratios 02/21/2013 29  20 - 55 % Final  . UIBC 02/21/2013 200  125 - 400 ug/dL Final    Performed at [facility]  . LDH 02/21/2013 181  94 - 250 U/L Final  . Retic Ct Pct 02/21/2013  1.5  0.4 - 3.1 % Final  . RBC. 02/21/2013 3.36* 3.87 - 5.11 MIL/uL Final  . Retic Count, Manual 02/21/2013 50.4  19.0 - 186.0 K/uL Final    PATHOLOGY: None.  Urinalysis No results found for this basename: colorurine,  appearanceur,  labspec,  phurine,  glucoseu,  hgbur,  bilirubinur,  ketonesur,  proteinur,  urobilinogen,  nitrite,  leukocytesur    RADIOGRAPHIC STUDIES: No results found.  ASSESSMENT:   #1. 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 No evidence of hemolysis or primary bone marrow disorder. #2. Epistaxis secondary to mucosal ulceration, status post cautery. #3. Coronary artery disease, stable with no evidence of dysrhythmia or heart failure. #4. Hypertension, controlled.   PLAN:  #1. Patient was told to increase iron supplements to one tablet twice a day taken on an empty stomach. #2. Followup with ENT. #3. Check her cardiologist regarding reinstitution of Plavix. #4. Office visit with CBC, reticulocyte count, and ferritin in 6 months.   All questions were answered. The patient knows to call the clinic with any problems, questions or concerns. We can certainly see the patient much sooner if necessary.   I spent 25 minutes counseling the patient face to face. The total time spent in the appointment was 30 minutes.    Doroteo Bradford, MD 03/07/2013 11:18 AM

## 2013-03-07 NOTE — Patient Instructions (Signed)
[facility] Discharge Instructions  RECOMMENDATIONS MADE BY THE CONSULTANT AND ANY TEST RESULTS WILL BE SENT TO YOUR REFERRING PHYSICIAN.  EXAM FINDINGS BY THE PHYSICIAN TODAY AND SIGNS OR SYMPTOMS TO REPORT TO CLINIC OR PRIMARY PHYSICIAN: Exam and findings as discussed by Dr. Barnet Glasgow.  OK to stay off plavix until you see Dr. Benjamine Mola.  Report increased fatigue or shortness of breath.  MEDICATIONS PRESCRIBED:  Increase your iron pills to 1 twice daily (take on empty stomach)  INSTRUCTIONS/FOLLOW-UP: Follow-up in 6 months with blood work and MD appointment.  Thank you for choosing [facility] to provide your oncology and hematology care.  To afford each patient quality time with our providers, please arrive at least 15 minutes before your scheduled appointment time.  With your help, our goal is to use those 15 minutes to complete the necessary work-up to ensure our physicians have the information they need to help with your evaluation and healthcare recommendations.    Effective January 1st, 2014, we ask that you re-schedule your appointment with our physicians should you arrive 10 or more minutes late for your appointment.  We strive to give you quality time with our providers, and arriving late affects you and other patients whose appointments are after yours.    Again, thank you for choosing [facility].  Our hope is that these requests will decrease the amount of time that you wait before being seen by our physicians.       _____________________________________________________________  Should you have questions after your visit to [facility], please contact our office at [phone] between the hours of 8:30 a.m. and 5:00 p.m.  Voicemails left after 4:30 p.m. will not be returned until the following business day.  For prescription refill requests, have your pharmacy contact our office with your prescription refill request.

## 2013-03-12 ENCOUNTER — Encounter (INDEPENDENT_AMBULATORY_CARE_PROVIDER_SITE_OTHER): Payer: Self-pay | Admitting: Internal Medicine

## 2013-03-12 ENCOUNTER — Ambulatory Visit (INDEPENDENT_AMBULATORY_CARE_PROVIDER_SITE_OTHER): Payer: Medicare Other | Admitting: Internal Medicine

## 2013-03-12 VITALS — BP 120/80 | HR 72 | Temp 98.8°F | Resp 18 | Ht 62.0 in | Wt 138.0 lb

## 2013-03-12 DIAGNOSIS — D649 Anemia, unspecified: Secondary | ICD-10-CM

## 2013-03-12 DIAGNOSIS — K649 Unspecified hemorrhoids: Secondary | ICD-10-CM

## 2013-03-12 DIAGNOSIS — R040 Epistaxis: Secondary | ICD-10-CM | POA: Insufficient documentation

## 2013-03-12 MED ORDER — HYDROCORTISONE ACETATE 25 MG RE SUPP: 25.0000 mg | Freq: Every day | RECTAL | Status: DC

## 2013-03-12 NOTE — Patient Instructions (Signed)
Call office with progress report later this week.

## 2013-03-12 NOTE — Progress Notes (Signed)
Presenting complaint;  Followup for iron deficiency anemia. Patient complains of problem with hemorrhoids and throbbing rectal pain.  Subjective:  Patient is 79 year old Caucasian female who presented about over 2 months ago with anemia and heme-positive stools. She underwent colonoscopy on 01/16/2013 revealing sigmoid colon diverticulosis as well as changes of diverticulitis involving one diverticulum. She also had external hemorrhoids. Patient was given prescription for Cipro and metronidazole as well as Mycolog-II cream. Patient decided not to take antibiotics after receiving side effects. She did not call the office to let us know. She now presents with complaints of problem with hemorrhoids and throbbing rectal pain. She states pain goes towards her sacrum. She has been using Mycolog-II cream as well as other OTC meds but with transient relief. She denies abdominal pain fever or chills. She has been experiencing epistaxes from left nares. She had site cauterized but unfortunately has not helped. She she is scheduled to follow with her ENT specialist later this week. She decided to stop Plavix once her appendectomy sac since has resolved. She has made an appointment with her cardiologist Dr. Georgina Peer. She was recently seen at speciality clinic and reports her H&H is up. She is not taking 2 iron pills a day.  Current Medications: Current Outpatient Prescriptions  Medication Sig Dispense Refill  . ALPRAZolam (XANAX) 0.5 MG tablet Take 0.5 mg by mouth at bedtime as needed for sleep.      Marland Kitchen amLODipine (NORVASC) 5 MG tablet Take 5 mg by mouth daily. Patient states that she takes 1/2 of the 5 mg tablet daily.      Marland Kitchen aspirin 81 MG tablet Take 81 mg by mouth daily.       . carvedilol (COREG) 25 MG tablet Take 6.25 mg by mouth 2 (two) times daily with a meal.       . FLAXSEED, LINSEED, PO Take 1 capsule by mouth daily.      . IRON, FERROUS GLUCONATE, PO Take by mouth 2 (two) times daily.       .  isosorbide mononitrate (IMDUR) 120 MG 24 hr tablet Take 60 mg by mouth daily.       . Multiple Vitamin (MULTIVITAMIN) tablet Take 1 tablet by mouth daily.      Marland Kitchen nystatin-triamcinolone (MYCOLOG II) cream Apply topically 2 (two) times daily. Apply cream to anal canal as directed twice daily for 2 weeks and stop  30 g  0  . triamcinolone (KENALOG) 0.025 % cream Apply topically 2 (two) times daily.      . vitamin B-12 (CYANOCOBALAMIN) 250 MCG tablet Take 2,500 mcg by mouth daily.      . clopidogrel (PLAVIX) 75 MG tablet TAKE ONE TABLET BY MOUTH ONCE DAILY WITH A MEAL  30 tablet  6  . metroNIDAZOLE (FLAGYL) 500 MG tablet Take 1 tablet (500 mg total) by mouth 2 (two) times daily.  20 tablet  0   No current facility-administered medications for this visit.     Objective: Blood pressure 120/80, pulse 72, temperature 98.8 F (37.1 C), temperature source Oral, resp. rate 18, height 5\' 2"  (1.575 m), weight 138 lb (62.596 kg). Patient is alert and in no acute distress. Conjunctiva is pink. Sclera is nonicteric Oropharyngeal mucosa is normal. No neck masses or thyromegaly noted. Abdomen is symmetrical. Bowel sounds are normal. No bruit noted. Abdomen is soft and nontender without organomegaly or masses. Rectal examination reveals no external abnormality. Similarly digital exam is within normal limits. There is no stool in rectum  No LE edema or clubbing noted.  Labs/studies Results: H&H from 02/21/2013 was 11.5 and 34.9.   Assessment:  #1. History of sigmoid diverticulitis. This diagnosis was made at time of diagnostic colonoscopy for anemia and heme-positive stools. She decided not to take antibiotic therapy. She did not have any symptoms suggestive of diverticulitis and her exam today is unremarkable. Therefore will monitor her course. #2. Anemia. H&H is coming up #3. Throbbing rectal pain. Digital exam is unremarkable. Recent colonoscopy was negative for proctitis the. If she does not respond  to therapy will consider pelvic CT.   Plan:  Anusol HC suppository 1 per rectum daily at bedtime for 2 weeks.. Patient will call with progress report later this week. If rectal symptoms do not resolve will proceed with abdominopelvic CT. Office visit on an as-needed basis.

## 2013-03-14 ENCOUNTER — Telehealth (INDEPENDENT_AMBULATORY_CARE_PROVIDER_SITE_OTHER): Payer: Self-pay | Admitting: *Deleted

## 2013-03-14 ENCOUNTER — Ambulatory Visit (INDEPENDENT_AMBULATORY_CARE_PROVIDER_SITE_OTHER): Payer: Medicare Other | Admitting: Otolaryngology

## 2013-03-14 DIAGNOSIS — R040 Epistaxis: Secondary | ICD-10-CM

## 2013-03-14 NOTE — Telephone Encounter (Signed)
No further recommendations at this time. I am glad she is feeling better.

## 2013-03-14 NOTE — Telephone Encounter (Signed)
I called patient this morning to get a progress repot from her. She states that she is feeling much better. She was unable to get the prescription of the cream as it cost over $100. She says that she has "filled herself up with Preparation H," and plans to do this as she is on a fixed income and can't afford extra bills. Patient was advised to call our office if anything changes. Forwarded to Dr.[name] to review.

## 2013-04-17 ENCOUNTER — Other Ambulatory Visit: Payer: Self-pay | Admitting: *Deleted

## 2013-04-17 MED ORDER — CARVEDILOL 25 MG PO TABS: 25.0000 mg | ORAL_TABLET | Freq: Two times a day (BID) | ORAL | Status: DC

## 2013-04-18 ENCOUNTER — Encounter: Payer: Self-pay | Admitting: Cardiovascular Disease

## 2013-04-18 ENCOUNTER — Ambulatory Visit (INDEPENDENT_AMBULATORY_CARE_PROVIDER_SITE_OTHER): Payer: Medicare Other | Admitting: Cardiovascular Disease

## 2013-04-18 VITALS — BP 220/108 | HR 77 | Ht 64.0 in | Wt 138.4 lb

## 2013-04-18 DIAGNOSIS — I2510 Atherosclerotic heart disease of native coronary artery without angina pectoris: Secondary | ICD-10-CM

## 2013-04-18 DIAGNOSIS — I447 Left bundle-branch block, unspecified: Secondary | ICD-10-CM

## 2013-04-18 DIAGNOSIS — I10 Essential (primary) hypertension: Secondary | ICD-10-CM

## 2013-04-18 DIAGNOSIS — K219 Gastro-esophageal reflux disease without esophagitis: Secondary | ICD-10-CM

## 2013-04-18 NOTE — Progress Notes (Signed)
Patient ID: [name], female   DOB: [birth year], 79 y.o.   MRN: [id number]     HPI: [name] is a 79 y.o. female who presents to the off for 6 month cardiology evaluation.  [name] is a 79 year old female with a history of left bundle branch block and established coronary artery disease. In July 2012 she underwent very difficult complex but successful high-speed rotational atherectomy of a very calcified LAD which was severely diffusely diseased. Subsequently, she has noticed marked improvement in symptomatology since that intervention. She also has a history of hypertension, intermittent ankle swelling, anxiety as well as stress. When I last saw her, she was only taking amlodipine one quarter of a 5 mg pill and I suggested that she at least take this 2.5 mg daily and added HCTZ to take on an as-needed basis for swelling. She also tells me that since that time she stopped taking her Plavix due to recurrent nosebleeds. She also has a history of GERD, she denies recent episodes of recurrent chest pressure or shortness of breath. She presents to the office for evaluation.   Past Medical History  Diagnosis Date  . Hypertension   . High cholesterol   . Hypertension     x 5-6 yrs.  . Colon polyps   . Anemia   . Chronic kidney disease   . Bleeding from the nose Oct 2014    Past Surgical History  Procedure Laterality Date  . Partial hysterectomy    . Breast masses      removed while she was pregnant  . Coronary angioplasty with stent placement       x 3-5 yrs for a blockage  . Colonoscopy N/A 01/16/2013    Procedure: COLONOSCOPY;  Surgeon: Rogene Houston, MD;  [facility]: [facility];  Service: Endoscopy;  Laterality: N/A;  1200    No Known Allergies  Current Outpatient Prescriptions  Medication Sig Dispense Refill  . ALPRAZolam (XANAX) 0.5 MG tablet Take 0.5 mg by mouth at bedtime as needed for sleep.      Marland Kitchen amLODipine (NORVASC) 5 MG tablet Take 5 mg by mouth daily.       Marland Kitchen  aspirin 81 MG tablet Take 81 mg by mouth daily.       . carvedilol (COREG) 25 MG tablet 25 mg. 1/2 tablet twice daily.      Marland Kitchen FLAXSEED, LINSEED, PO Take 1 capsule by mouth daily.      . hydrocortisone (ANUSOL-HC) 25 MG suppository Place 1 suppository (25 mg total) rectally at bedtime.  14 suppository  1  . IRON, FERROUS GLUCONATE, PO Take by mouth 2 (two) times daily.       . isosorbide mononitrate (IMDUR) 120 MG 24 hr tablet Take 60 mg by mouth daily.       . Multiple Vitamin (MULTIVITAMIN) tablet Take 1 tablet by mouth daily.      Marland Kitchen nystatin-triamcinolone (MYCOLOG II) cream Apply topically 2 (two) times daily. Apply cream to anal canal as directed twice daily for 2 weeks and stop  30 g  0  . triamcinolone (KENALOG) 0.025 % cream Apply topically as needed.       . vitamin B-12 (CYANOCOBALAMIN) 250 MCG tablet Take 2,500 mcg by mouth daily.       No current facility-administered medications for this visit.    History   Social History  . Marital Status: Divorced    Spouse Name: N/A    Number of Children: N/A  .  Years of Education: N/A   Occupational History  . Not on file.   Social History Main Topics  . Smoking status: Never Smoker   . Smokeless tobacco: Never Used  . Alcohol Use: No  . Drug Use: No  . Sexual Activity: Not on file   Other Topics Concern  . Not on file   Social History Narrative  . No narrative on file   Additional social history is notable in that she is single and has 4 children. In the past she had significant stress and anxiety with her separation and a more. He does dance. There is no tobacco use or alcohol use.  Family History  Problem Relation Age of Onset  . Colon cancer Neg Hx     ROS is negative for fevers, chills or night sweats. She denies recent headaches. She denies visual changes. There is no change in hearing. She is unaware lymphadenopathy. She denies wheezing or shortness of breath. She denies recurrent anginal symptoms. She denies  palpitations, presyncope or syncope. She denies nausea vomiting or diarrhea. She does have history of GERD for which he takes when necessary Pepcid. She denies blood in stool or urine. There is no claudication. She denies myalgias. She denies significant recent edema. She denies thyroid problems. She is unaware of any diabetes. She denies difficulty with sleep.  Other comprehensive 14 point system review is negative.  PE BP 220/108  Pulse 77  Ht 5\' 4"  (1.626 m)  Wt 138 lb 6.4 oz (62.778 kg)  BMI 23.74 kg/m2  Repeat blood pressure was 180/92. General: Alert, oriented, no distress.  Skin: normal turgor, no rashes HEENT: Normocephalic, atraumatic. Pupils round and reactive; sclera anicteric;no lid lag.  Nose without nasal septal hypertrophy Mouth/Parynx benign; Mallinpatti scale 2 Neck: No JVD, no carotid briuts; normal carotid upstroke Lungs: clear to ausculatation and percussion; no wheezing or rales Chest wall: no tenderness to palpitation Heart: RRR, s1 s2 normal 1/6 systolic murmur, unchanged. Abdomen: soft, nontender; no hepatosplenomehaly, BS+; abdominal aorta nontender and not dilated by palpation. Back: no CVA tenderness Pulses 2+ Extremities: no clubbing cyanosis or edema, Homan's sign negative  Neurologic: grossly nonfocal Psychologic: normal affect and mood.  ECG: Sinus rhythm with left bundle branch block and associated repolarization changes.  LABS:  BMET    Component Value Date/Time   NA 142 11/10/2010 0406   K 4.3 11/10/2010 0406   CL 109 11/10/2010 0406   CO2 27 11/10/2010 0406   GLUCOSE 91 11/10/2010 0406   BUN 27* 11/10/2010 0406   CREATININE 1.86* 11/10/2010 0406   CALCIUM 8.5 11/10/2010 0406   GFRNONAA 26* 11/10/2010 0406   GFRAA 32* 11/10/2010 0406     Hepatic Function Panel     Component Value Date/Time   PROT 6.4 11/08/2010 0625   ALBUMIN 3.4* 11/08/2010 0625   AST 20 11/08/2010 0625   ALT 16 11/08/2010 0625   ALKPHOS 73 11/08/2010 0625   BILITOT 0.4 11/08/2010 0625      CBC    Component Value Date/Time   WBC 4.3 02/21/2013 1100   RBC 3.36* 02/21/2013 1100   RBC 3.36* 02/21/2013 1100   HGB 11.5* 02/21/2013 1100   HCT 34.9* 02/21/2013 1100   PLT 182 02/21/2013 1100   MCV 103.9* 02/21/2013 1100   MCH 34.2* 02/21/2013 1100   MCHC 33.0 02/21/2013 1100   RDW 12.5 02/21/2013 1100   LYMPHSABS 1.0 02/21/2013 1100   MONOABS 0.4 02/21/2013 1100   EOSABS 0.2 02/21/2013 1100  BASOSABS 0.0 02/21/2013 1100     BNP No results found for this basename: probnp    Lipid Panel  No results found for this basename: chol, trig, hdl, cholhdl, vldl, ldlcalc     RADIOLOGY: No results found.    ASSESSMENT AND PLAN: My impression is that [name] continues to do well with reference to her severe decalcified coronary artery disease, now 2-1/2 years status post successful difficult high-speed rotational atherectomy with resolution of prior symptomatology. Presently, she is stage II hypertension. She has stopped taking her Plavix due to recurrent nosebleeds. I have recommended she take 7.5 mg of amlodipine today and then to increase her dose from 2.5-5 mg daily. I am also further titrating her carvedilol from 6.25 twice a day to 12.5 mg twice a day. We discussed  significant sodium restriction. I will see her back in the office in 3-4 weeks for followup evaluation to make certain blood pressure is significantly improved and additional adjustments may be made at that time.     Troy Sine, MD, [facility]  05/03/2013 4:41 PM

## 2013-04-18 NOTE — Patient Instructions (Signed)
Your physician has recommended you make the following change in your medication: take 7.5 mg of the amlodipine today. Then take 5 mg daily. Increase the carvedilol 12.5 mg twice daily.   ( 1/2 of the 25 mg tablet twice daily)  Your physician recommends that you schedule a follow-up appointment in: 3-4 weeks.

## 2013-04-23 ENCOUNTER — Encounter (INDEPENDENT_AMBULATORY_CARE_PROVIDER_SITE_OTHER): Payer: Self-pay

## 2013-05-03 ENCOUNTER — Encounter: Payer: Self-pay | Admitting: Cardiovascular Disease

## 2013-05-03 DIAGNOSIS — I447 Left bundle-branch block, unspecified: Secondary | ICD-10-CM | POA: Insufficient documentation

## 2013-05-03 DIAGNOSIS — I2510 Atherosclerotic heart disease of native coronary artery without angina pectoris: Secondary | ICD-10-CM | POA: Insufficient documentation

## 2013-05-03 DIAGNOSIS — K219 Gastro-esophageal reflux disease without esophagitis: Secondary | ICD-10-CM | POA: Insufficient documentation

## 2013-05-03 DIAGNOSIS — I10 Essential (primary) hypertension: Secondary | ICD-10-CM | POA: Insufficient documentation

## 2013-05-06 ENCOUNTER — Other Ambulatory Visit: Payer: Self-pay | Admitting: *Deleted

## 2013-05-06 ENCOUNTER — Encounter: Payer: Self-pay | Admitting: Cardiovascular Disease

## 2013-05-06 MED ORDER — AMLODIPINE BESYLATE 5 MG PO TABS: 5.0000 mg | ORAL_TABLET | Freq: Every day | ORAL | Status: DC

## 2013-05-30 ENCOUNTER — Encounter: Payer: Self-pay | Admitting: *Deleted

## 2013-06-03 ENCOUNTER — Ambulatory Visit: Payer: Medicare Other | Admitting: Cardiovascular Disease

## 2013-08-22 ENCOUNTER — Other Ambulatory Visit (HOSPITAL_COMMUNITY): Payer: Medicare Other

## 2013-08-23 ENCOUNTER — Ambulatory Visit (HOSPITAL_COMMUNITY): Payer: Medicare Other

## 2013-08-26 ENCOUNTER — Ambulatory Visit (HOSPITAL_COMMUNITY): Payer: Medicare Other

## 2013-08-29 ENCOUNTER — Telehealth (HOSPITAL_COMMUNITY): Payer: Self-pay

## 2013-08-29 NOTE — Telephone Encounter (Signed)
Call to [name] to discuss recent labs and possibility of feraheme infusion.  States "I'm taking an iron pill each day and I have to have cataract surgery in May and I can't afford to have that done and come there as well.  I'll call after I've had my surgery and try to get something set up."  I gave patient my direct line [phone] to call me when she's ready to come in.

## 2013-08-29 NOTE — Telephone Encounter (Signed)
Ok

## 2013-08-30 ENCOUNTER — Other Ambulatory Visit (HOSPITAL_COMMUNITY): Payer: Self-pay | Admitting: Oncology

## 2013-08-30 ENCOUNTER — Telehealth (HOSPITAL_COMMUNITY): Payer: Self-pay

## 2013-08-30 DIAGNOSIS — K649 Unspecified hemorrhoids: Secondary | ICD-10-CM

## 2013-08-30 DIAGNOSIS — D649 Anemia, unspecified: Secondary | ICD-10-CM

## 2013-08-30 DIAGNOSIS — R040 Epistaxis: Secondary | ICD-10-CM

## 2013-08-30 DIAGNOSIS — D509 Iron deficiency anemia, unspecified: Secondary | ICD-10-CM

## 2013-08-30 NOTE — Telephone Encounter (Signed)
Call from patient and wants to schedule office visit and feraheme infusion before 5/18.  Both scheduled for 09/04/13.  Will need orders written.

## 2013-09-03 NOTE — Progress Notes (Signed)
Cindy Kilts, MD [id number] [place] [address]  Anemia, iron deficiency - Plan: clopidogrel (PLAVIX) 75 MG tablet, Anti-parietal antibody, Intrinsic Factor Antibodies, Anti-parietal antibody, Intrinsic Factor Antibodies, ferumoxytol (FERAHEME) 1,020 mg in sodium chloride 0.9 % 100 mL IVPB  Anemia macrocytic - Plan: clopidogrel (PLAVIX) 75 MG tablet, Anti-parietal antibody, Intrinsic Factor Antibodies, Anti-parietal antibody, Intrinsic Factor Antibodies, ferumoxytol (FERAHEME) 1,020 mg in sodium chloride 0.9 % 100 mL IVPB  Hemorrhoids - Plan: ferumoxytol (FERAHEME) 1,020 mg in sodium chloride 0.9 % 100 mL IVPB  Epistaxis - Plan: ferumoxytol (FERAHEME) 1,020 mg in sodium chloride 0.9 % 100 mL IVPB  CURRENT THERAPY: IV Feraheme 1020 mg today + oral iron supplements  INTERVAL HISTORY: [name] 80 y.o. 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  I personally reviewed and went over laboratory results with the patient.  The results are noted within this dictation.  Her ferritin is noted to decline over the past 6 months and now is a good time to provide her IV Feraheme in order to prevent depletion of iron stores.   She does admit to increased fatigue and tiredness.  She denies any ice or starch cravings.  She denies any blood loss.  I provided her education regarding iron deficiency anemia.  Iron deficiency anemia is the most common anemia.  Beside playing a critical role as an oxygen carrier in the heme group of hemoglobin, iron is found in many key proteins in the cells, such as cytochromes and myoglobin, so it is not unexpected that a lack of iron has effects other than anemia.  Three studies have focused on nonanemic iron deficiency leading to fatigue.  Two studies showed that oral iron supplementation reduces fatigue, with no significant change in hemoglobin levels, in  women with a ferritin level of less than 50 ng/mL, and a third study showed a lessening of fatigue with parental iron administration in women with a ferritin level of 15 ng/mL or less or an iron saturation of 20% or less.   Owing to obligate iron loss through menses, women are at greater risk for iron deficiency than men.  Iron loss in all women averages 1-3 ng per day, and dietary intake is often inadequate to maintain a positive iron balance.  A 1967 study showed that 25% of healthy, college-age women had no bone marrow iron stores and that another 33% had low stores.  Pregnancy adds to demands for iron, with requirements increasing to 6 ng per day by the end of pregnancy.  Athletes are another group at risk for iron deficiency.  Gastrointestinal tract blood is the source of iron loss, and exercise-induced hemolysis leads to urinary iron losses.  Decreased absorption of iron has also been implicated as a cause of iron deficiency, because of levels of hepcidin are often elevated in athletes owing to training-induced inflammation.    Obesity and its surgical treatment are also at risk factors for iron deficiency.  Obese patients are often iron-deficient, with increased hepcidin level being implicated in decreased absorption.  After bariatric surgery, the incidence of iron deficiency can be as high as 50%.  Because the main site of iron absorption is the duodenum, surgeries that involve bypassing this part of the bowel are associated with an increased incidence of iron deficiency.  However, iron deficiency is seen as a sequela of most types of bariatric surgery.    -  NEJM Volume 371, No 14, pg 1325-1326   We discussed the risks, benefits, alternatives, and side effects of feraheme discussed including anaphylaxis leading to death, local reaction, allergic reaction, local inflammation.  Hematologically, she denies any complaints and ROS questioning is negative.   Past Medical History  Diagnosis Date  .  Hyperlipidemia   . Hypertension     x 5-6 yrs.  . Colon polyps   . Anemia   . Chronic kidney disease   . Bleeding from the nose Oct 2014  . CAD (coronary artery disease)   . LBBB (left bundle branch block)   . Anxiety   . Cataract of both eyes     has Anemia macrocytic; Personal history of colonic polyps; Anemia, iron deficiency; Hemorrhoids; Epistaxis; stage 2 hypertension; CAD (coronary artery disease); GERD (gastroesophageal reflux disease); and LBBB (left bundle branch block) on her problem list.     has No Known Allergies.  [name] does not currently have medications on file.  Past Surgical History  Procedure Laterality Date  . Partial hysterectomy    . Breast surgery      masses removed while she was pregnant  . Coronary angioplasty with stent placement  11/08/2010    complex high-speed rotational atherectomy of calcified LAD, diffusely disease LAD system (Dr. Corky Downs)  . Colonoscopy N/A 01/16/2013    Procedure: COLONOSCOPY;  Surgeon: Rogene Houston, MD;  Location: [facility];  Service: Endoscopy;  Laterality: N/A;  1200  . Transthoracic echocardiogram  10/2010    EF 45-50%, mod conc LVH, grade 1 diastolic dysfunction, mildly calcified left AV cusp; calcified MV annulus  . Nm myocar perf wall motion  05/2011    lexiscan myoview; normal pattern of perfusion in all regions; post-stress EF 55%; low risk scan   . Cardiac catheterization  06/20/2008    severe single vessel high-grade stenosis of mid LAD at bifurcation of diagonal 1 and setpal perforator 1; diffuse coronary calcification of L coronary system; 50% stenosis of mid RCA (Dr. Jackie Plum)    Denies any headaches, dizziness, double vision, fevers, chills, night sweats, nausea, vomiting, diarrhea, constipation, chest pain, heart palpitations, shortness of breath, blood in stool, black tarry stool, urinary pain, urinary burning, urinary frequency, hematuria.   PHYSICAL EXAMINATION  ECOG PERFORMANCE STATUS: 1 -  Symptomatic but completely ambulatory  Filed Vitals:   09/04/13 1152  BP: 157/77  Pulse: 77  Temp: 98.4 F (36.9 C)  Resp: 18    GENERAL:alert, no distress, well nourished, well developed, comfortable, cooperative and smiling SKIN: skin color, texture, turgor are normal, no rashes or significant lesions HEAD: Normocephalic, No masses, lesions, tenderness or abnormalities EYES: normal, PERRLA, EOMI, Conjunctiva are pink and non-injected EARS: External ears normal OROPHARYNX:mucous membranes are moist  NECK: supple, trachea midline LYMPH:  not examined BREAST:not examined LUNGS: not examined HEART: not examined ABDOMEN:not examined BACK: not examined EXTREMITIES:less then 2 second capillary refill, no joint deformities, effusion, or inflammation, no cyanosis  NEURO: alert & oriented x 3 with fluent speech, no focal motor/sensory deficits, gait normal    LABORATORY DATA: Ferritin 38     ASSESSMENT:  1. 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 No evidence of hemolysis or primary bone marrow disorder.  2. Epistaxis secondary to mucosal ulceration, status post cautery.  3. Coronary artery disease, stable with no evidence of dysrhythmia or heart failure.  4. Hypertension, controlled.  Patient Active Problem List   Diagnosis Date Noted  . stage 2 hypertension  05/03/2013  . CAD (coronary artery disease) 05/03/2013  . GERD (gastroesophageal reflux disease) 05/03/2013  . LBBB (left bundle branch block) 05/03/2013  . Hemorrhoids 03/12/2013  . Epistaxis 03/12/2013  . Anemia, iron deficiency 03/02/2013  . Anemia macrocytic 12/20/2012  . Personal history of colonic polyps 12/20/2012     PLAN:  1. I personally reviewed and went over laboratory results with the patient.  The results are noted within this dictation. 2. Labs in 12 weeks: CBC diff, Ferritin 3. Labs today: intrinsic factor antibody and anti-parietal cell antibody. 4. Patient  education regarding IDA 5. Risks, benefits, alternatives, and side effects of Feraheme provided 6. IV Feraheme 1020 mg today 7. Recommend holding PO ferrous sulfate 8. Return in 3 months for follow-up   THERAPY PLAN:  We will administer IV Feraheme today.  She may stop her PO iron if desired.   All questions were answered. The patient knows to call the clinic with any problems, questions or concerns. We can certainly see the patient much sooner if necessary.  Patient and plan discussed with Dr. Farrel Gobble and he is in agreement with the aforementioned.   [name] 09/04/2013

## 2013-09-04 ENCOUNTER — Encounter (HOSPITAL_BASED_OUTPATIENT_CLINIC_OR_DEPARTMENT_OTHER): Payer: Medicare Other

## 2013-09-04 ENCOUNTER — Encounter (HOSPITAL_COMMUNITY): Payer: Medicare Other | Attending: Oncology | Admitting: Oncology

## 2013-09-04 ENCOUNTER — Encounter (HOSPITAL_COMMUNITY): Payer: Self-pay | Admitting: Oncology

## 2013-09-04 VITALS — BP 157/77 | HR 77 | Temp 98.4°F | Resp 18 | Wt 137.4 lb

## 2013-09-04 DIAGNOSIS — D649 Anemia, unspecified: Secondary | ICD-10-CM

## 2013-09-04 DIAGNOSIS — D509 Iron deficiency anemia, unspecified: Secondary | ICD-10-CM

## 2013-09-04 DIAGNOSIS — F411 Generalized anxiety disorder: Secondary | ICD-10-CM | POA: Insufficient documentation

## 2013-09-04 DIAGNOSIS — Z9861 Coronary angioplasty status: Secondary | ICD-10-CM | POA: Insufficient documentation

## 2013-09-04 DIAGNOSIS — I10 Essential (primary) hypertension: Secondary | ICD-10-CM

## 2013-09-04 DIAGNOSIS — I129 Hypertensive chronic kidney disease with stage 1 through stage 4 chronic kidney disease, or unspecified chronic kidney disease: Secondary | ICD-10-CM | POA: Insufficient documentation

## 2013-09-04 DIAGNOSIS — R040 Epistaxis: Secondary | ICD-10-CM | POA: Insufficient documentation

## 2013-09-04 DIAGNOSIS — D539 Nutritional anemia, unspecified: Secondary | ICD-10-CM

## 2013-09-04 DIAGNOSIS — I447 Left bundle-branch block, unspecified: Secondary | ICD-10-CM | POA: Insufficient documentation

## 2013-09-04 DIAGNOSIS — H269 Unspecified cataract: Secondary | ICD-10-CM | POA: Insufficient documentation

## 2013-09-04 DIAGNOSIS — N189 Chronic kidney disease, unspecified: Secondary | ICD-10-CM | POA: Insufficient documentation

## 2013-09-04 DIAGNOSIS — E785 Hyperlipidemia, unspecified: Secondary | ICD-10-CM | POA: Insufficient documentation

## 2013-09-04 DIAGNOSIS — Z860100 Personal history of colon polyps, unspecified: Secondary | ICD-10-CM | POA: Insufficient documentation

## 2013-09-04 DIAGNOSIS — K649 Unspecified hemorrhoids: Secondary | ICD-10-CM | POA: Insufficient documentation

## 2013-09-04 DIAGNOSIS — I2510 Atherosclerotic heart disease of native coronary artery without angina pectoris: Secondary | ICD-10-CM | POA: Insufficient documentation

## 2013-09-04 DIAGNOSIS — D7589 Other specified diseases of blood and blood-forming organs: Secondary | ICD-10-CM | POA: Insufficient documentation

## 2013-09-04 MED ORDER — SODIUM CHLORIDE 0.9 % IV SOLN
1020.0000 mg | Freq: Once | INTRAVENOUS | Status: AC
  Administered 2013-09-04: 1020 mg via INTRAVENOUS
  Filled 2013-09-04: qty 34

## 2013-09-04 MED ORDER — SODIUM CHLORIDE 0.9 % IV SOLN
Freq: Once | INTRAVENOUS | Status: AC
  Administered 2013-09-04: 13:00:00 via INTRAVENOUS

## 2013-09-04 MED ORDER — SODIUM CHLORIDE 0.9 % IJ SOLN
10.0000 mL | Freq: Once | INTRAMUSCULAR | Status: AC
  Administered 2013-09-04: 10 mL via INTRAVENOUS

## 2013-09-04 NOTE — Patient Instructions (Signed)
[name] Discharge Instructions  RECOMMENDATIONS MADE BY THE CONSULTANT AND ANY TEST RESULTS WILL BE SENT TO YOUR REFERRING PHYSICIAN.  EXAM FINDINGS BY THE PHYSICIAN TODAY AND SIGNS OR SYMPTOMS TO REPORT TO CLINIC OR PRIMARY PHYSICIAN: Exam and findings as discussed by Robynn Pane, PA-C.  Will check some additional labs today and will give you the feraheme infusion today.  You can stop the oral iron. Report increased fatigue or shortness of breath.  MEDICATIONS PRESCRIBED:  none  INSTRUCTIONS/FOLLOW-UP: Follow-up in 3 months with labs and office visit.  Thank you for choosing [facility] to provide your oncology and hematology care.  To afford each patient quality time with our providers, please arrive at least 15 minutes before your scheduled appointment time.  With your help, our goal is to use those 15 minutes to complete the necessary work-up to ensure our physicians have the information they need to help with your evaluation and healthcare recommendations.    Effective January 1st, 2014, we ask that you re-schedule your appointment with our physicians should you arrive 10 or more minutes late for your appointment.  We strive to give you quality time with our providers, and arriving late affects you and other patients whose appointments are after yours.    Again, thank you for choosing [facility].  Our hope is that these requests will decrease the amount of time that you wait before being seen by our physicians.       _____________________________________________________________  Should you have questions after your visit to [facility], please contact our office at [phone] between the hours of 8:30 a.m. and 5:00 p.m.  Voicemails left after 4:30 p.m. will not be returned until the following business day.  For prescription refill requests, have your pharmacy contact our office with your prescription refill request.

## 2013-09-04 NOTE — Progress Notes (Signed)
Tolerated infusion well. 

## 2013-09-05 LAB — ANTI-PARIETAL ANTIBODY: Parietal Cell Antibody-IgG: NEGATIVE

## 2013-09-10 LAB — INTRINSIC FACTOR ANTIBODIES: INTRINSIC FACTOR: NEGATIVE

## 2013-09-16 ENCOUNTER — Other Ambulatory Visit: Payer: Self-pay | Admitting: *Deleted

## 2013-09-16 MED ORDER — ISOSORBIDE MONONITRATE ER 120 MG PO TB24: ORAL_TABLET | ORAL | Status: DC

## 2013-09-16 NOTE — Telephone Encounter (Signed)
Rx was sent to pharmacy electronically. 

## 2013-09-24 ENCOUNTER — Encounter (HOSPITAL_COMMUNITY): Payer: Self-pay | Admitting: Pharmacy Technician

## 2013-09-25 ENCOUNTER — Encounter: Payer: Self-pay | Admitting: Hematology and Oncology

## 2013-10-04 ENCOUNTER — Other Ambulatory Visit: Payer: Self-pay

## 2013-10-04 ENCOUNTER — Encounter (HOSPITAL_COMMUNITY): Payer: Self-pay

## 2013-10-04 ENCOUNTER — Encounter (HOSPITAL_COMMUNITY)
Admission: RE | Admit: 2013-10-04 | Discharge: 2013-10-04 | Disposition: A | Discharge status: Home / Self Care | Payer: Medicare Other | Source: Ambulatory Visit | Attending: Ophthalmology | Admitting: Ophthalmology

## 2013-10-04 DIAGNOSIS — Z01812 Encounter for preprocedural laboratory examination: Secondary | ICD-10-CM | POA: Insufficient documentation

## 2013-10-04 DIAGNOSIS — Z01810 Encounter for preprocedural cardiovascular examination: Secondary | ICD-10-CM | POA: Insufficient documentation

## 2013-10-04 HISTORY — DX: Unspecified osteoarthritis, unspecified site: M19.90

## 2013-10-04 LAB — BASIC METABOLIC PANEL
BUN: 38 mg/dL — ABNORMAL HIGH (ref 6–23)
CALCIUM: 9 mg/dL (ref 8.4–10.5)
CHLORIDE: 108 meq/L (ref 96–112)
CO2: 26 mEq/L (ref 19–32)
Creatinine, Ser: 1.72 mg/dL — ABNORMAL HIGH (ref 0.50–1.10)
GFR calc Af Amer: 32 mL/min — ABNORMAL LOW (ref >90)
GFR calc non Af Amer: 27 mL/min — ABNORMAL LOW (ref >90)
Glucose, Bld: 125 mg/dL — ABNORMAL HIGH (ref 70–99)
Potassium: 4.3 mEq/L (ref 3.7–5.3)
Sodium: 144 mEq/L (ref 137–147)

## 2013-10-04 LAB — HEMOGLOBIN AND HEMATOCRIT, BLOOD
HCT: 32.5 % — ABNORMAL LOW (ref 36.0–46.0)
Hemoglobin: 10.6 g/dL — ABNORMAL LOW (ref 12.0–15.0)

## 2013-10-04 NOTE — Patient Instructions (Signed)
Your procedure is scheduled on: 10/10/2013  Report to [facility] at  27  AM.  Call this number if you have problems the morning of surgery: [phone]   Do not eat food or drink liquids :After Midnight.      Take these medicines the morning of surgery with A SIP OF WATER: xanax, amlodipine, carvedilol, isosorbide   Do not wear jewelry, make-up or nail polish.  Do not wear lotions, powders, or perfumes.   Do not shave 48 hours prior to surgery.  Do not bring valuables to the hospital.  Contacts, dentures or bridgework may not be worn into surgery.  Leave suitcase in the car. After surgery it may be brought to your room.  For patients admitted to the hospital, checkout time is 11:00 AM the day of discharge.   Patients discharged the day of surgery will not be allowed to drive home.  :     Please read over the following fact sheets that you were given: Coughing and Deep Breathing, Surgical Site Infection Prevention, Anesthesia Post-op Instructions and Care and Recovery After Surgery    Cataract A cataract is a clouding of the lens of the eye. When a lens becomes cloudy, vision is reduced based on the degree and nature of the clouding. Many cataracts reduce vision to some degree. Some cataracts make people more near-sighted as they develop. Other cataracts increase glare. Cataracts that are ignored and become worse can sometimes look white. The white color can be seen through the pupil. CAUSES   Aging. However, cataracts may occur at any age, even in newborns.   Certain drugs.   Trauma to the eye.   Certain diseases such as diabetes.   Specific eye diseases such as chronic inflammation inside the eye or a sudden attack of a rare form of glaucoma.   Inherited or acquired medical problems.  SYMPTOMS   Gradual, progressive drop in vision in the affected eye.   Severe, rapid visual loss. This most often happens when trauma is the cause.  DIAGNOSIS  To detect a cataract, an eye  doctor examines the lens. Cataracts are best diagnosed with an exam of the eyes with the pupils enlarged (dilated) by drops.  TREATMENT  For an early cataract, vision may improve by using different eyeglasses or stronger lighting. If that does not help your vision, surgery is the only effective treatment. A cataract needs to be surgically removed when vision loss interferes with your everyday activities, such as driving, reading, or watching TV. A cataract may also have to be removed if it prevents examination or treatment of another eye problem. Surgery removes the cloudy lens and usually replaces it with a substitute lens (intraocular lens, IOL).  At a time when both you and your doctor agree, the cataract will be surgically removed. If you have cataracts in both eyes, only one is usually removed at a time. This allows the operated eye to heal and be out of danger from any possible problems after surgery (such as infection or poor wound healing). In rare cases, a cataract may be doing damage to your eye. In these cases, your caregiver may advise surgical removal right away. The vast majority of people who have cataract surgery have better vision afterward. HOME CARE INSTRUCTIONS  If you are not planning surgery, you may be asked to do the following:  Use different eyeglasses.   Use stronger or brighter lighting.   Ask your eye doctor about reducing your medicine dose or  changing medicines if it is thought that a medicine caused your cataract. Changing medicines does not make the cataract go away on its own.   Become familiar with your surroundings. Poor vision can lead to injury. Avoid bumping into things on the affected side. You are at a higher risk for tripping or falling.   Exercise extreme care when driving or operating machinery.   Wear sunglasses if you are sensitive to bright light or experiencing problems with glare.  SEEK IMMEDIATE MEDICAL CARE IF:   You have a worsening or sudden  vision loss.   You notice redness, swelling, or increasing pain in the eye.   You have a fever.  Document Released: 04/25/2005 Document Revised: 04/14/2011 Document Reviewed: 12/17/2010 [employer] Patient Information 2012 [place].PATIENT INSTRUCTIONS POST-ANESTHESIA  IMMEDIATELY FOLLOWING SURGERY:  Do not drive or operate machinery for the first twenty four hours after surgery.  Do not make any important decisions for twenty four hours after surgery or while taking narcotic pain medications or sedatives.  If you develop intractable nausea and vomiting or a severe headache please notify your doctor immediately.  FOLLOW-UP:  Please make an appointment with your surgeon as instructed. You do not need to follow up with anesthesia unless specifically instructed to do so.  WOUND CARE INSTRUCTIONS (if applicable):  Keep a dry clean dressing on the anesthesia/puncture wound site if there is drainage.  Once the wound has quit draining you may leave it open to air.  Generally you should leave the bandage intact for twenty four hours unless there is drainage.  If the epidural site drains for more than 36-48 hours please call the anesthesia department.  QUESTIONS?:  Please feel free to call your physician or the hospital operator if you have any questions, and they will be happy to assist you.

## 2013-10-04 NOTE — Pre-Procedure Instructions (Signed)
Patient given information to sign up for my chart at home. 

## 2013-10-09 MED ORDER — NEOMYCIN-POLYMYXIN-DEXAMETH 3.5-10000-0.1 OP SUSP
OPHTHALMIC | Status: AC
  Filled 2013-10-09: qty 5

## 2013-10-09 MED ORDER — PHENYLEPHRINE HCL 2.5 % OP SOLN
OPHTHALMIC | Status: AC
  Filled 2013-10-09: qty 15

## 2013-10-09 MED ORDER — CYCLOPENTOLATE-PHENYLEPHRINE OP SOLN OPTIME - NO CHARGE
OPHTHALMIC | Status: AC
  Filled 2013-10-09: qty 2

## 2013-10-09 MED ORDER — TETRACAINE HCL 0.5 % OP SOLN
OPHTHALMIC | Status: AC
  Filled 2013-10-09: qty 2

## 2013-10-10 ENCOUNTER — Ambulatory Visit (HOSPITAL_COMMUNITY)
Admission: RE | Admit: 2013-10-10 | Discharge: 2013-10-10 | Disposition: A | Discharge status: Home / Self Care | Payer: Medicare Other | Source: Ambulatory Visit | Attending: Ophthalmology | Admitting: Ophthalmology

## 2013-10-10 ENCOUNTER — Encounter (HOSPITAL_COMMUNITY): Payer: Medicare Other | Admitting: Anesthesiology

## 2013-10-10 ENCOUNTER — Ambulatory Visit (HOSPITAL_COMMUNITY): Payer: Medicare Other | Admitting: Anesthesiology

## 2013-10-10 ENCOUNTER — Encounter (HOSPITAL_COMMUNITY): Payer: Self-pay | Admitting: *Deleted

## 2013-10-10 ENCOUNTER — Encounter (HOSPITAL_COMMUNITY)
Admission: RE | Disposition: A | Discharge status: Home / Self Care | Payer: Self-pay | Source: Ambulatory Visit | Attending: Ophthalmology

## 2013-10-10 DIAGNOSIS — I2510 Atherosclerotic heart disease of native coronary artery without angina pectoris: Secondary | ICD-10-CM | POA: Insufficient documentation

## 2013-10-10 DIAGNOSIS — K219 Gastro-esophageal reflux disease without esophagitis: Secondary | ICD-10-CM | POA: Insufficient documentation

## 2013-10-10 DIAGNOSIS — H2589 Other age-related cataract: Secondary | ICD-10-CM | POA: Insufficient documentation

## 2013-10-10 DIAGNOSIS — D649 Anemia, unspecified: Secondary | ICD-10-CM | POA: Insufficient documentation

## 2013-10-10 DIAGNOSIS — I10 Essential (primary) hypertension: Secondary | ICD-10-CM | POA: Insufficient documentation

## 2013-10-10 DIAGNOSIS — F411 Generalized anxiety disorder: Secondary | ICD-10-CM | POA: Insufficient documentation

## 2013-10-10 DIAGNOSIS — Z9861 Coronary angioplasty status: Secondary | ICD-10-CM | POA: Insufficient documentation

## 2013-10-10 HISTORY — PX: CATARACT EXTRACTION W/PHACO: SHX586

## 2013-10-10 SURGERY — PHACOEMULSIFICATION, CATARACT, WITH IOL INSERTION
Anesthesia: Monitor Anesthesia Care | Site: Eye | Laterality: Right

## 2013-10-10 MED ORDER — MIDAZOLAM HCL 2 MG/2ML IJ SOLN
1.0000 mg | INTRAMUSCULAR | Status: DC | PRN
  Administered 2013-10-10: 2 mg via INTRAVENOUS

## 2013-10-10 MED ORDER — FENTANYL CITRATE 0.05 MG/ML IJ SOLN
25.0000 ug | INTRAMUSCULAR | Status: AC
  Administered 2013-10-10 (×2): 25 ug via INTRAVENOUS

## 2013-10-10 MED ORDER — PROVISC 10 MG/ML IO SOLN
INTRAOCULAR | Status: DC | PRN
  Administered 2013-10-10: 0.85 mL via INTRAOCULAR

## 2013-10-10 MED ORDER — EPINEPHRINE HCL 1 MG/ML IJ SOLN
INTRAMUSCULAR | Status: AC
  Filled 2013-10-10: qty 1

## 2013-10-10 MED ORDER — LIDOCAINE HCL 3.5 % OP GEL
1.0000 "application " | Freq: Once | OPHTHALMIC | Status: AC
  Administered 2013-10-10: 1 via OPHTHALMIC

## 2013-10-10 MED ORDER — PHENYLEPHRINE HCL 2.5 % OP SOLN
1.0000 [drp] | OPHTHALMIC | Status: AC | PRN
  Administered 2013-10-10 (×3): 1 [drp] via OPHTHALMIC

## 2013-10-10 MED ORDER — LACTATED RINGERS IV SOLN
INTRAVENOUS | Status: DC
  Administered 2013-10-10: 08:00:00 via INTRAVENOUS

## 2013-10-10 MED ORDER — FENTANYL CITRATE 0.05 MG/ML IJ SOLN
INTRAMUSCULAR | Status: AC
  Filled 2013-10-10: qty 2

## 2013-10-10 MED ORDER — POVIDONE-IODINE 5 % OP SOLN
OPHTHALMIC | Status: DC | PRN
Start: 2013-10-10 — End: 2013-10-10
  Administered 2013-10-10: 1 via OPHTHALMIC

## 2013-10-10 MED ORDER — BSS IO SOLN
INTRAOCULAR | Status: DC | PRN
  Administered 2013-10-10: 15 mL via INTRAOCULAR

## 2013-10-10 MED ORDER — EPINEPHRINE HCL 1 MG/ML IJ SOLN
INTRAOCULAR | Status: DC | PRN
  Administered 2013-10-10: 09:00:00

## 2013-10-10 MED ORDER — NEOMYCIN-POLYMYXIN-DEXAMETH 3.5-10000-0.1 OP SUSP
OPHTHALMIC | Status: DC | PRN
  Administered 2013-10-10: 2 [drp] via OPHTHALMIC

## 2013-10-10 MED ORDER — TETRACAINE HCL 0.5 % OP SOLN
1.0000 [drp] | OPHTHALMIC | Status: AC | PRN
  Administered 2013-10-10 (×3): 1 [drp] via OPHTHALMIC

## 2013-10-10 MED ORDER — LIDOCAINE HCL (PF) 1 % IJ SOLN
INTRAMUSCULAR | Status: DC | PRN
  Administered 2013-10-10: .9 mL

## 2013-10-10 MED ORDER — ONDANSETRON HCL 4 MG/2ML IJ SOLN: 4.0000 mg | Freq: Once | INTRAMUSCULAR | Status: DC | PRN

## 2013-10-10 MED ORDER — FENTANYL CITRATE 0.05 MG/ML IJ SOLN: 25.0000 ug | INTRAMUSCULAR | Status: DC | PRN

## 2013-10-10 MED ORDER — MIDAZOLAM HCL 2 MG/2ML IJ SOLN
INTRAMUSCULAR | Status: AC
  Filled 2013-10-10: qty 2

## 2013-10-10 MED ORDER — LIDOCAINE 3.5 % OP GEL OPTIME - NO CHARGE
OPHTHALMIC | Status: DC | PRN
  Administered 2013-10-10: 1 [drp] via OPHTHALMIC

## 2013-10-10 MED ORDER — CYCLOPENTOLATE-PHENYLEPHRINE 0.2-1 % OP SOLN
1.0000 [drp] | OPHTHALMIC | Status: AC | PRN
  Administered 2013-10-10 (×3): 1 [drp] via OPHTHALMIC

## 2013-10-10 SURGICAL SUPPLY — 35 items
CAPSULAR TENSION RING-AMO (OPHTHALMIC RELATED) IMPLANT
CLOTH BEACON ORANGE TIMEOUT ST (SAFETY) ×2 IMPLANT
EYE SHIELD UNIVERSAL CLEAR (GAUZE/BANDAGES/DRESSINGS) ×2 IMPLANT
GLOVE BIO SURGEON STRL SZ 6.5 (GLOVE) IMPLANT
GLOVE BIO SURGEONS STRL SZ 6.5 (GLOVE)
GLOVE BIOGEL PI IND STRL 6.5 (GLOVE) IMPLANT
GLOVE BIOGEL PI IND STRL 7.0 (GLOVE) IMPLANT
GLOVE BIOGEL PI IND STRL 7.5 (GLOVE) IMPLANT
GLOVE BIOGEL PI INDICATOR 6.5 (GLOVE) ×2
GLOVE BIOGEL PI INDICATOR 7.0 (GLOVE) ×2
GLOVE BIOGEL PI INDICATOR 7.5 (GLOVE) ×2
GLOVE ECLIPSE 6.5 STRL STRAW (GLOVE) IMPLANT
GLOVE ECLIPSE 7.0 STRL STRAW (GLOVE) IMPLANT
GLOVE ECLIPSE 7.5 STRL STRAW (GLOVE) IMPLANT
GLOVE EXAM NITRILE LRG STRL (GLOVE) IMPLANT
GLOVE EXAM NITRILE MD LF STRL (GLOVE) IMPLANT
GLOVE SKINSENSE NS SZ6.5 (GLOVE)
GLOVE SKINSENSE NS SZ7.0 (GLOVE)
GLOVE SKINSENSE STRL SZ6.5 (GLOVE) IMPLANT
GLOVE SKINSENSE STRL SZ7.0 (GLOVE) IMPLANT
GOWN STRL REUS W/ TWL XL LVL3 (GOWN DISPOSABLE) IMPLANT
GOWN STRL REUS W/TWL XL LVL3 (GOWN DISPOSABLE) ×3
KIT VITRECTOMY (OPHTHALMIC RELATED) IMPLANT
PAD ARMBOARD 7.5X6 YLW CONV (MISCELLANEOUS) ×2 IMPLANT
PROC W NO LENS (INTRAOCULAR LENS)
PROC W SPEC LENS (INTRAOCULAR LENS)
PROCESS W NO LENS (INTRAOCULAR LENS) IMPLANT
PROCESS W SPEC LENS (INTRAOCULAR LENS) IMPLANT
RING MALYGIN (MISCELLANEOUS) IMPLANT
SIGHTPATH CAT PROC W REG LENS (Ophthalmic Related) ×3 IMPLANT
SYR TB 1ML LL NO SAFETY (SYRINGE) ×2 IMPLANT
TAPE SURG TRANSPORE 1 IN (GAUZE/BANDAGES/DRESSINGS) IMPLANT
TAPE SURGICAL TRANSPORE 1 IN (GAUZE/BANDAGES/DRESSINGS) ×2
VISCOELASTIC ADDITIONAL (OPHTHALMIC RELATED) IMPLANT
WATER STERILE IRR 250ML POUR (IV SOLUTION) ×2 IMPLANT

## 2013-10-10 NOTE — Discharge Instructions (Signed)
Anesthesia Post Note  Patient: [name]  Procedure(s) Performed: Procedure(s) (LRB): CATARACT EXTRACTION PHACO AND INTRAOCULAR LENS PLACEMENT (IOC) (Right)  Anesthesia type: {PROCEDURES; ANE POST ANESTHESIA IR:[id number]  Patient location: {PLACES; ANE UT:[id number]  Post pain: {FINDINGS; ANE POST SY:[id number]  Post assessment: {ASSESSMENT; ANE OL:[id number]  Last Vitals:  Filed Vitals:   10/10/13 0912  BP: 161/73  Pulse: 62  Temp: 97.9 F (36.6 C)  Resp: 16

## 2013-10-10 NOTE — Anesthesia Preprocedure Evaluation (Signed)
Anesthesia Evaluation  Patient identified by MRN, date of birth, ID band Patient awake    Reviewed: Allergy & Precautions, H&P , NPO status , Patient's Chart, lab work & pertinent test results, reviewed documented beta blocker date and time   Airway Mallampati: II TM Distance: >3 FB     Dental  (+) Partial Lower, Teeth Intact   Pulmonary neg pulmonary ROS,  breath sounds clear to auscultation        Cardiovascular hypertension, Pt. on medications and Pt. on home beta blockers - angina+ CAD and + Cardiac Stents Rhythm:Regular Rate:Normal     Neuro/Psych PSYCHIATRIC DISORDERS Anxiety    GI/Hepatic GERD-  Medicated and Controlled,  Endo/Other    Renal/GU Renal disease     Musculoskeletal   Abdominal   Peds  Hematology  (+) anemia ,   Anesthesia Other Findings   Reproductive/Obstetrics                           Anesthesia Physical Anesthesia Plan  ASA: III  Anesthesia Plan: MAC   Post-op Pain Management:    Induction: Intravenous  Airway Management Planned: Nasal Cannula  Additional Equipment:   Intra-op Plan:   Post-operative Plan:   Informed Consent: I have reviewed the patients History and Physical, chart, labs and discussed the procedure including the risks, benefits and alternatives for the proposed anesthesia with the patient or authorized representative who has indicated his/her understanding and acceptance.     Plan Discussed with:   Anesthesia Plan Comments:         Anesthesia Quick Evaluation

## 2013-10-10 NOTE — Transfer of Care (Signed)
Immediate Anesthesia Transfer of Care Note  Patient: [name]  Procedure(s) Performed: Procedure(s) with comments: CATARACT EXTRACTION PHACO AND INTRAOCULAR LENS PLACEMENT (IOC) (Right) - CDE:11.96  Patient Location: Short Stay  Anesthesia Type:MAC  Level of Consciousness: awake, alert , oriented and patient cooperative  Airway & Oxygen Therapy: Patient Spontanous Breathing  Post-op Assessment: Report given to PACU RN and Post -op Vital signs reviewed and stable  Post vital signs: Reviewed and stable  Complications: No apparent anesthesia complications

## 2013-10-10 NOTE — Anesthesia Postprocedure Evaluation (Signed)
  Anesthesia Post-op Note  Patient: [name]  Procedure(s) Performed: Procedure(s) with comments: CATARACT EXTRACTION PHACO AND INTRAOCULAR LENS PLACEMENT (IOC) (Right) - CDE:11.96  Patient Location: Short Stay  Anesthesia Type:MAC  Level of Consciousness: awake, alert , oriented and patient cooperative  Airway and Oxygen Therapy: Patient Spontanous Breathing  Post-op Pain: none  Post-op Assessment: Post-op Vital signs reviewed, Patient's Cardiovascular Status Stable, Respiratory Function Stable and Pain level controlled  Post-op Vital Signs: Reviewed and stable  Last Vitals:  Filed Vitals:   10/10/13 0840  BP: 137/62  Temp:   Resp: 21    Complications: No apparent anesthesia complications

## 2013-10-10 NOTE — Op Note (Signed)
Date of Admission: 10/10/2013  Date of Surgery: 10/10/2013   Pre-Op Dx: Cataract Right Eye  Post-Op Dx: Cataract Right  Eye,  Dx Code 366.19  Surgeon: Tonny Branch, M.D.  Assistants: None  Anesthesia: Topical with MAC  Indications: Painless, progressive loss of vision with compromise of daily activities.  Surgery: Cataract Extraction with Intraocular lens Implant Right Eye  Discription: The patient had dilating drops and viscous lidocaine placed into the Right eye in the pre-op holding area. After transfer to the operating room, a time out was performed. The patient was then prepped and draped. Beginning with a 73 degree blade a paracentesis port was made at the surgeon's 2 o'clock position. The anterior chamber was then filled with 1% non-preserved lidocaine. This was followed by filling the anterior chamber with Provisc.  A 2.75mm keratome blade was used to make a clear corneal incision at the temporal limbus.  A bent cystatome needle was used to create a continuous tear capsulotomy. Hydrodissection was performed with balanced salt solution on a Fine canula. The lens nucleus was then removed using the phacoemulsification handpiece. Residual cortex was removed with the I&A handpiece. The anterior chamber and capsular bag were refilled with Provisc. A posterior chamber intraocular lens was placed into the capsular bag with it's injector. The implant was positioned with the Kuglan hook. The Provisc was then removed from the anterior chamber and capsular bag with the I&A handpiece. Stromal hydration of the main incision and paracentesis port was performed with BSS on a Fine canula. The wounds were tested for leak which was negative. The patient tolerated the procedure well. There were no operative complications. The patient was then transferred to the recovery room in stable condition.  Complications: None  Specimen: None  EBL: None  Prosthetic device: Hoya iSert 250, power 22.0 D, SN [id number].

## 2013-10-10 NOTE — H&P (Signed)
I have reviewed the H&P, the patient was re-examined, and I have identified no interval changes in medical condition and plan of care since the history and physical of record  

## 2013-10-11 ENCOUNTER — Encounter (HOSPITAL_COMMUNITY): Payer: Self-pay | Admitting: Ophthalmology

## 2013-11-04 ENCOUNTER — Encounter (HOSPITAL_COMMUNITY): Payer: Self-pay | Admitting: Pharmacy Technician

## 2013-11-14 ENCOUNTER — Encounter (HOSPITAL_COMMUNITY)
Admission: RE | Admit: 2013-11-14 | Discharge: 2013-11-14 | Disposition: A | Discharge status: Home / Self Care | Payer: Medicare Other | Source: Ambulatory Visit | Attending: Ophthalmology | Admitting: Ophthalmology

## 2013-11-14 ENCOUNTER — Encounter (HOSPITAL_COMMUNITY): Payer: Self-pay

## 2013-11-15 MED ORDER — TETRACAINE HCL 0.5 % OP SOLN
OPHTHALMIC | Status: AC
  Filled 2013-11-15: qty 2

## 2013-11-15 MED ORDER — LIDOCAINE HCL (PF) 1 % IJ SOLN
INTRAMUSCULAR | Status: AC
  Filled 2013-11-15: qty 2

## 2013-11-15 MED ORDER — PHENYLEPHRINE HCL 2.5 % OP SOLN
OPHTHALMIC | Status: AC
  Filled 2013-11-15: qty 15

## 2013-11-15 MED ORDER — NEOMYCIN-POLYMYXIN-DEXAMETH 3.5-10000-0.1 OP SUSP
OPHTHALMIC | Status: AC
  Filled 2013-11-15: qty 5

## 2013-11-15 MED ORDER — CYCLOPENTOLATE-PHENYLEPHRINE OP SOLN OPTIME - NO CHARGE
OPHTHALMIC | Status: AC
  Filled 2013-11-15: qty 2

## 2013-11-15 MED ORDER — LIDOCAINE HCL 3.5 % OP GEL
OPHTHALMIC | Status: AC
  Filled 2013-11-15: qty 1

## 2013-11-18 ENCOUNTER — Encounter (HOSPITAL_COMMUNITY): Payer: Medicare Other | Admitting: Anesthesiology

## 2013-11-18 ENCOUNTER — Encounter (HOSPITAL_COMMUNITY)
Admission: RE | Disposition: A | Discharge status: Home / Self Care | Payer: Self-pay | Source: Ambulatory Visit | Attending: Ophthalmology

## 2013-11-18 ENCOUNTER — Ambulatory Visit (HOSPITAL_COMMUNITY): Payer: Medicare Other | Admitting: Anesthesiology

## 2013-11-18 ENCOUNTER — Ambulatory Visit (HOSPITAL_COMMUNITY)
Admission: RE | Admit: 2013-11-18 | Discharge: 2013-11-18 | Disposition: A | Discharge status: Home / Self Care | Payer: Medicare Other | Source: Ambulatory Visit | Attending: Ophthalmology | Admitting: Ophthalmology

## 2013-11-18 ENCOUNTER — Encounter (HOSPITAL_COMMUNITY): Payer: Self-pay

## 2013-11-18 DIAGNOSIS — I10 Essential (primary) hypertension: Secondary | ICD-10-CM | POA: Insufficient documentation

## 2013-11-18 DIAGNOSIS — F411 Generalized anxiety disorder: Secondary | ICD-10-CM | POA: Insufficient documentation

## 2013-11-18 DIAGNOSIS — I2510 Atherosclerotic heart disease of native coronary artery without angina pectoris: Secondary | ICD-10-CM | POA: Insufficient documentation

## 2013-11-18 DIAGNOSIS — K219 Gastro-esophageal reflux disease without esophagitis: Secondary | ICD-10-CM | POA: Insufficient documentation

## 2013-11-18 DIAGNOSIS — H2181 Floppy iris syndrome: Secondary | ICD-10-CM | POA: Insufficient documentation

## 2013-11-18 DIAGNOSIS — N289 Disorder of kidney and ureter, unspecified: Secondary | ICD-10-CM | POA: Insufficient documentation

## 2013-11-18 DIAGNOSIS — Z79899 Other long term (current) drug therapy: Secondary | ICD-10-CM | POA: Insufficient documentation

## 2013-11-18 DIAGNOSIS — D649 Anemia, unspecified: Secondary | ICD-10-CM | POA: Insufficient documentation

## 2013-11-18 DIAGNOSIS — H269 Unspecified cataract: Secondary | ICD-10-CM | POA: Insufficient documentation

## 2013-11-18 HISTORY — PX: CATARACT EXTRACTION W/PHACO: SHX586

## 2013-11-18 SURGERY — PHACOEMULSIFICATION, CATARACT, WITH IOL INSERTION
Anesthesia: Monitor Anesthesia Care | Site: Eye | Laterality: Left

## 2013-11-18 MED ORDER — LIDOCAINE HCL (PF) 1 % IJ SOLN
INTRAOCULAR | Status: DC | PRN
  Administered 2013-11-18: 11:00:00 via OPHTHALMIC

## 2013-11-18 MED ORDER — EPINEPHRINE HCL 1 MG/ML IJ SOLN
INTRAOCULAR | Status: DC | PRN
  Administered 2013-11-18: 11:00:00

## 2013-11-18 MED ORDER — NEOMYCIN-POLYMYXIN-DEXAMETH 3.5-10000-0.1 OP SUSP
OPHTHALMIC | Status: DC | PRN
  Administered 2013-11-18: 2 [drp] via OPHTHALMIC

## 2013-11-18 MED ORDER — EPINEPHRINE HCL 1 MG/ML IJ SOLN
INTRAMUSCULAR | Status: AC
  Filled 2013-11-18: qty 1

## 2013-11-18 MED ORDER — PHENYLEPHRINE HCL 2.5 % OP SOLN
1.0000 [drp] | OPHTHALMIC | Status: AC
  Administered 2013-11-18 (×3): 1 [drp] via OPHTHALMIC

## 2013-11-18 MED ORDER — MIDAZOLAM HCL 2 MG/2ML IJ SOLN
1.0000 mg | INTRAMUSCULAR | Status: DC | PRN
  Administered 2013-11-18: 2 mg via INTRAVENOUS

## 2013-11-18 MED ORDER — MIDAZOLAM HCL 2 MG/2ML IJ SOLN
INTRAMUSCULAR | Status: AC
Start: 2013-11-18 — End: 2013-11-18
  Filled 2013-11-18: qty 2

## 2013-11-18 MED ORDER — PROVISC 10 MG/ML IO SOLN
INTRAOCULAR | Status: DC | PRN
  Administered 2013-11-18: 0.85 mL via INTRAOCULAR

## 2013-11-18 MED ORDER — BSS IO SOLN
INTRAOCULAR | Status: DC | PRN
Start: 2013-11-18 — End: 2013-11-18
  Administered 2013-11-18: 15 mL via INTRAOCULAR

## 2013-11-18 MED ORDER — FENTANYL CITRATE 0.05 MG/ML IJ SOLN
INTRAMUSCULAR | Status: AC
  Filled 2013-11-18: qty 2

## 2013-11-18 MED ORDER — LIDOCAINE HCL 3.5 % OP GEL
1.0000 "application " | Freq: Once | OPHTHALMIC | Status: AC
  Administered 2013-11-18: 1 via OPHTHALMIC

## 2013-11-18 MED ORDER — TETRACAINE HCL 0.5 % OP SOLN
1.0000 [drp] | OPHTHALMIC | Status: AC
  Administered 2013-11-18 (×3): 1 [drp] via OPHTHALMIC

## 2013-11-18 MED ORDER — FENTANYL CITRATE 0.05 MG/ML IJ SOLN
25.0000 ug | INTRAMUSCULAR | Status: AC
  Administered 2013-11-18 (×2): 25 ug via INTRAVENOUS

## 2013-11-18 MED ORDER — LACTATED RINGERS IV SOLN
INTRAVENOUS | Status: DC
  Administered 2013-11-18: 11:00:00 via INTRAVENOUS

## 2013-11-18 MED ORDER — LIDOCAINE 3.5 % OP GEL OPTIME - NO CHARGE
OPHTHALMIC | Status: DC | PRN
  Administered 2013-11-18: 1 [drp] via OPHTHALMIC

## 2013-11-18 MED ORDER — POVIDONE-IODINE 5 % OP SOLN
OPHTHALMIC | Status: DC | PRN
  Administered 2013-11-18: 1 via OPHTHALMIC

## 2013-11-18 MED ORDER — CYCLOPENTOLATE-PHENYLEPHRINE 0.2-1 % OP SOLN
1.0000 [drp] | OPHTHALMIC | Status: AC
  Administered 2013-11-18 (×3): 1 [drp] via OPHTHALMIC

## 2013-11-18 SURGICAL SUPPLY — 33 items
CAPSULAR TENSION RING-AMO (OPHTHALMIC RELATED) IMPLANT
CLOTH BEACON ORANGE TIMEOUT ST (SAFETY) ×2 IMPLANT
EYE SHIELD UNIVERSAL CLEAR (GAUZE/BANDAGES/DRESSINGS) ×2 IMPLANT
GLOVE BIO SURGEON STRL SZ 6.5 (GLOVE) IMPLANT
GLOVE BIO SURGEONS STRL SZ 6.5 (GLOVE)
GLOVE BIOGEL PI IND STRL 6.5 (GLOVE) IMPLANT
GLOVE BIOGEL PI IND STRL 7.0 (GLOVE) IMPLANT
GLOVE BIOGEL PI IND STRL 7.5 (GLOVE) IMPLANT
GLOVE BIOGEL PI INDICATOR 6.5 (GLOVE)
GLOVE BIOGEL PI INDICATOR 7.0 (GLOVE) ×2
GLOVE BIOGEL PI INDICATOR 7.5 (GLOVE)
GLOVE ECLIPSE 6.5 STRL STRAW (GLOVE) IMPLANT
GLOVE ECLIPSE 7.0 STRL STRAW (GLOVE) IMPLANT
GLOVE ECLIPSE 7.5 STRL STRAW (GLOVE) IMPLANT
GLOVE EXAM NITRILE LRG STRL (GLOVE) IMPLANT
GLOVE EXAM NITRILE MD LF STRL (GLOVE) IMPLANT
GLOVE SKINSENSE NS SZ6.5 (GLOVE)
GLOVE SKINSENSE NS SZ7.0 (GLOVE) ×2
GLOVE SKINSENSE STRL SZ6.5 (GLOVE) IMPLANT
GLOVE SKINSENSE STRL SZ7.0 (GLOVE) IMPLANT
KIT VITRECTOMY (OPHTHALMIC RELATED) IMPLANT
PAD ARMBOARD 7.5X6 YLW CONV (MISCELLANEOUS) ×2 IMPLANT
PROC W NO LENS (INTRAOCULAR LENS)
PROC W SPEC LENS (INTRAOCULAR LENS)
PROCESS W NO LENS (INTRAOCULAR LENS) IMPLANT
PROCESS W SPEC LENS (INTRAOCULAR LENS) IMPLANT
RING MALYGIN (MISCELLANEOUS) IMPLANT
SIGHTPATH CAT PROC W REG LENS (Ophthalmic Related) ×3 IMPLANT
SYR TB 1ML LL NO SAFETY (SYRINGE) ×2 IMPLANT
TAPE SURG TRANSPORE 1 IN (GAUZE/BANDAGES/DRESSINGS) IMPLANT
TAPE SURGICAL TRANSPORE 1 IN (GAUZE/BANDAGES/DRESSINGS) ×2
VISCOELASTIC ADDITIONAL (OPHTHALMIC RELATED) IMPLANT
WATER STERILE IRR 250ML POUR (IV SOLUTION) ×2 IMPLANT

## 2013-11-18 NOTE — Discharge Instructions (Signed)

## 2013-11-18 NOTE — H&P (Signed)
I have reviewed the H&P, the patient was re-examined, and I have identified no interval changes in medical condition and plan of care since the history and physical of record  

## 2013-11-18 NOTE — Transfer of Care (Signed)
Immediate Anesthesia Transfer of Care Note  Patient: [name]  Procedure(s) Performed: Procedure(s) with comments: CATARACT EXTRACTION PHACO AND INTRAOCULAR LENS PLACEMENT (IOC) (Left) - CDE 15.96  Patient Location: Short Stay  Anesthesia Type:MAC  Level of Consciousness: awake  Airway & Oxygen Therapy: Patient Spontanous Breathing  Post-op Assessment: Report given to PACU RN  Post vital signs: Reviewed  Complications: No apparent anesthesia complications

## 2013-11-18 NOTE — Op Note (Signed)
Date of Admission: 11/18/2013  Date of Surgery: 11/18/2013   Pre-Op Dx: Cataract Left Eye  Post-Op Dx: Combined Cataract Left  Eye,  Dx Code 366.19, Intraoperative Floppy Iris Syndrome, Left Eye, Dx Code 364.81  Surgeon: Tonny Branch, M.D.  Assistants: None  Anesthesia: Topical with MAC  Indications: Painless, progressive loss of vision with compromise of daily activities.  Surgery: Cataract Extraction with Intraocular lens Implant Left Eye  Discription: The patient had dilating drops and viscous lidocaine placed into the Left eye in the pre-op holding area. After transfer to the operating room, a time out was performed. The patient was then prepped and draped. Beginning with a 6 degree blade a paracentesis port was made at the surgeon's 2 o'clock position. The anterior chamber was then filled with 1% non-preserved lidocaine. This was followed by filling the anterior chamber with Provisc.  A 2.52mm keratome blade was used to make a clear corneal incision at the temporal limbus.  A bent cystatome needle was used to create a continuous tear capsulotomy. Hydrodissection was performed with balanced salt solution on a Fine canula. The lens nucleus was then removed using the phacoemulsification handpiece. Residual cortex was removed with the I&A handpiece. The anterior chamber and capsular bag were refilled with Provisc. A posterior chamber intraocular lens was placed into the capsular bag with it's injector. The implant was positioned with the Kuglan hook. The Provisc was then removed from the anterior chamber and capsular bag with the I&A handpiece. Stromal hydration of the main incision and paracentesis port was performed with BSS on a Fine canula. The wounds were tested for leak which was negative. The patient tolerated the procedure well. There were no operative complications. The patient was then transferred to the recovery room in stable condition.  Complications: None  Specimen: None  EBL:  None  Prosthetic device: Hoya iSert 250, power 22.0 D, SN [id number].

## 2013-11-18 NOTE — Anesthesia Postprocedure Evaluation (Signed)
  Anesthesia Post-op Note  Patient: [name]  Procedure(s) Performed: Procedure(s) with comments: CATARACT EXTRACTION PHACO AND INTRAOCULAR LENS PLACEMENT (IOC) (Left) - CDE 15.96  Patient Location: Short Stay  Anesthesia Type:MAC  Level of Consciousness: awake, alert  and oriented  Airway and Oxygen Therapy: Patient Spontanous Breathing  Post-op Pain: none  Post-op Assessment: Post-op Vital signs reviewed, Patient's Cardiovascular Status Stable, Respiratory Function Stable, Patent Airway and No signs of Nausea or vomiting  Post-op Vital Signs: Reviewed and stable  Last Vitals:  Filed Vitals:   11/18/13 1016  Pulse: 69  Temp: 37 C  Resp: 16    Complications: No apparent anesthesia complications

## 2013-11-18 NOTE — Anesthesia Preprocedure Evaluation (Signed)
Anesthesia Evaluation  Patient identified by MRN, date of birth, ID band Patient awake    Reviewed: Allergy & Precautions, H&P , NPO status , Patient's Chart, lab work & pertinent test results, reviewed documented beta blocker date and time   Airway Mallampati: II TM Distance: >3 FB     Dental  (+) Partial Lower, Teeth Intact   Pulmonary neg pulmonary ROS,  breath sounds clear to auscultation        Cardiovascular hypertension, Pt. on medications and Pt. on home beta blockers - angina+ CAD and + Cardiac Stents + dysrhythmias Rhythm:Regular Rate:Normal     Neuro/Psych PSYCHIATRIC DISORDERS Anxiety    GI/Hepatic GERD-  Medicated and Controlled,  Endo/Other    Renal/GU Renal disease     Musculoskeletal   Abdominal   Peds  Hematology  (+) anemia ,   Anesthesia Other Findings   Reproductive/Obstetrics                           Anesthesia Physical Anesthesia Plan  ASA: III  Anesthesia Plan: MAC   Post-op Pain Management:    Induction: Intravenous  Airway Management Planned: Nasal Cannula  Additional Equipment:   Intra-op Plan:   Post-operative Plan:   Informed Consent: I have reviewed the patients History and Physical, chart, labs and discussed the procedure including the risks, benefits and alternatives for the proposed anesthesia with the patient or authorized representative who has indicated his/her understanding and acceptance.     Plan Discussed with:   Anesthesia Plan Comments:         Anesthesia Quick Evaluation

## 2013-11-19 ENCOUNTER — Encounter (HOSPITAL_COMMUNITY): Payer: Self-pay | Admitting: Ophthalmology

## 2013-12-02 ENCOUNTER — Other Ambulatory Visit (HOSPITAL_COMMUNITY): Payer: Self-pay

## 2013-12-02 DIAGNOSIS — D509 Iron deficiency anemia, unspecified: Secondary | ICD-10-CM

## 2013-12-02 NOTE — Progress Notes (Signed)
-  No show-  [name]  

## 2013-12-04 ENCOUNTER — Other Ambulatory Visit (HOSPITAL_COMMUNITY): Payer: Medicare Other

## 2013-12-04 ENCOUNTER — Ambulatory Visit (HOSPITAL_COMMUNITY): Payer: Medicare Other | Admitting: Oncology

## 2014-01-02 ENCOUNTER — Ambulatory Visit (INDEPENDENT_AMBULATORY_CARE_PROVIDER_SITE_OTHER): Payer: Medicare Other | Admitting: Otolaryngology

## 2014-01-02 DIAGNOSIS — D3709 Neoplasm of uncertain behavior of other specified sites of the oral cavity: Secondary | ICD-10-CM

## 2014-01-02 DIAGNOSIS — D3705 Neoplasm of uncertain behavior of pharynx: Secondary | ICD-10-CM

## 2014-01-02 DIAGNOSIS — D3701 Neoplasm of uncertain behavior of lip: Secondary | ICD-10-CM

## 2014-01-07 ENCOUNTER — Encounter (HOSPITAL_BASED_OUTPATIENT_CLINIC_OR_DEPARTMENT_OTHER): Payer: Self-pay | Admitting: *Deleted

## 2014-01-07 ENCOUNTER — Other Ambulatory Visit: Payer: Self-pay | Admitting: Otolaryngology

## 2014-01-07 ENCOUNTER — Telehealth: Payer: Self-pay | Admitting: Cardiovascular Disease

## 2014-01-07 NOTE — Telephone Encounter (Signed)
Spoke with patient. She states no clearance is needed but she was advised to notify [name] since she has a stent.   Message forwarded to [name] as Juluis Rainier

## 2014-01-07 NOTE — Telephone Encounter (Signed)
Patient wanted to let [name] know that she is having a mass removed from her gum tomorrow and she will be put to sleep.

## 2014-01-07 NOTE — Telephone Encounter (Signed)
ok 

## 2014-01-08 ENCOUNTER — Encounter (HOSPITAL_COMMUNITY)
Admission: RE | Admit: 2014-01-08 | Discharge: 2014-01-08 | Disposition: A | Discharge status: Home / Self Care | Payer: Medicare Other | Source: Ambulatory Visit | Attending: Otolaryngology | Admitting: Otolaryngology

## 2014-01-08 DIAGNOSIS — R220 Localized swelling, mass and lump, head: Secondary | ICD-10-CM | POA: Insufficient documentation

## 2014-01-08 DIAGNOSIS — Z01818 Encounter for other preprocedural examination: Secondary | ICD-10-CM | POA: Diagnosis present

## 2014-01-08 DIAGNOSIS — R221 Localized swelling, mass and lump, neck: Principal | ICD-10-CM

## 2014-01-08 LAB — BASIC METABOLIC PANEL
Anion gap: 10 (ref 5–15)
BUN: 29 mg/dL — ABNORMAL HIGH (ref 6–23)
CALCIUM: 9.2 mg/dL (ref 8.4–10.5)
CO2: 28 meq/L (ref 19–32)
CREATININE: 1.81 mg/dL — AB (ref 0.50–1.10)
Chloride: 104 mEq/L (ref 96–112)
GFR calc Af Amer: 30 mL/min — ABNORMAL LOW (ref >90)
GFR calc non Af Amer: 26 mL/min — ABNORMAL LOW (ref >90)
GLUCOSE: 118 mg/dL — AB (ref 70–99)
Potassium: 5.4 mEq/L — ABNORMAL HIGH (ref 3.7–5.3)
SODIUM: 142 meq/L (ref 137–147)

## 2014-01-10 MED ORDER — ONDANSETRON HCL 4 MG/2ML IJ SOLN: 4.0000 mg | Freq: Once | INTRAMUSCULAR | Status: AC | PRN

## 2014-01-10 MED ORDER — FENTANYL CITRATE 0.05 MG/ML IJ SOLN: 25.0000 ug | INTRAMUSCULAR | Status: DC | PRN

## 2014-01-14 ENCOUNTER — Encounter (HOSPITAL_BASED_OUTPATIENT_CLINIC_OR_DEPARTMENT_OTHER)
Admission: RE | Disposition: A | Discharge status: Home / Self Care | Payer: Self-pay | Source: Ambulatory Visit | Attending: Otolaryngology

## 2014-01-14 ENCOUNTER — Ambulatory Visit (HOSPITAL_BASED_OUTPATIENT_CLINIC_OR_DEPARTMENT_OTHER)
Admission: RE | Admit: 2014-01-14 | Discharge: 2014-01-14 | Disposition: A | Discharge status: Home / Self Care | Payer: Medicare Other | Source: Ambulatory Visit | Attending: Otolaryngology | Admitting: Otolaryngology

## 2014-01-14 ENCOUNTER — Ambulatory Visit (HOSPITAL_BASED_OUTPATIENT_CLINIC_OR_DEPARTMENT_OTHER): Payer: Medicare Other | Admitting: Anesthesiology

## 2014-01-14 ENCOUNTER — Encounter (HOSPITAL_BASED_OUTPATIENT_CLINIC_OR_DEPARTMENT_OTHER): Payer: Self-pay | Admitting: *Deleted

## 2014-01-14 ENCOUNTER — Encounter (HOSPITAL_BASED_OUTPATIENT_CLINIC_OR_DEPARTMENT_OTHER): Payer: Medicare Other | Admitting: Anesthesiology

## 2014-01-14 DIAGNOSIS — D105 Benign neoplasm of other parts of oropharynx: Secondary | ICD-10-CM | POA: Insufficient documentation

## 2014-01-14 DIAGNOSIS — D3705 Neoplasm of uncertain behavior of pharynx: Secondary | ICD-10-CM

## 2014-01-14 DIAGNOSIS — J392 Other diseases of pharynx: Secondary | ICD-10-CM

## 2014-01-14 DIAGNOSIS — D3701 Neoplasm of uncertain behavior of lip: Secondary | ICD-10-CM

## 2014-01-14 DIAGNOSIS — R229 Localized swelling, mass and lump, unspecified: Secondary | ICD-10-CM | POA: Diagnosis present

## 2014-01-14 DIAGNOSIS — D3709 Neoplasm of uncertain behavior of other specified sites of the oral cavity: Secondary | ICD-10-CM

## 2014-01-14 HISTORY — PX: EXCISION ORAL TUMOR: SHX6265

## 2014-01-14 LAB — POCT HEMOGLOBIN-HEMACUE: Hemoglobin: 11 g/dL — ABNORMAL LOW (ref 12.0–15.0)

## 2014-01-14 SURGERY — EXCISION, NEOPLASM, MOUTH
Anesthesia: General | Laterality: Right

## 2014-01-14 MED ORDER — FENTANYL CITRATE 0.05 MG/ML IJ SOLN
INTRAMUSCULAR | Status: DC | PRN
  Administered 2014-01-14: 50 ug via INTRAVENOUS

## 2014-01-14 MED ORDER — FENTANYL CITRATE 0.05 MG/ML IJ SOLN: 50.0000 ug | INTRAMUSCULAR | Status: DC | PRN

## 2014-01-14 MED ORDER — ONDANSETRON HCL 4 MG/2ML IJ SOLN
INTRAMUSCULAR | Status: DC | PRN
  Administered 2014-01-14: 4 mg via INTRAVENOUS

## 2014-01-14 MED ORDER — BACITRACIN 500 UNIT/GM EX OINT
TOPICAL_OINTMENT | CUTANEOUS | Status: DC | PRN
  Administered 2014-01-14: 1 via TOPICAL

## 2014-01-14 MED ORDER — LIDOCAINE-EPINEPHRINE 1 %-1:100000 IJ SOLN
INTRAMUSCULAR | Status: DC | PRN
  Administered 2014-01-14: .75 mL

## 2014-01-14 MED ORDER — LIDOCAINE-EPINEPHRINE 1 %-1:100000 IJ SOLN
INTRAMUSCULAR | Status: AC
  Filled 2014-01-14: qty 1

## 2014-01-14 MED ORDER — LACTATED RINGERS IV SOLN
INTRAVENOUS | Status: DC
  Administered 2014-01-14: 09:00:00 via INTRAVENOUS

## 2014-01-14 MED ORDER — FENTANYL CITRATE 0.05 MG/ML IJ SOLN
INTRAMUSCULAR | Status: AC
  Filled 2014-01-14: qty 4

## 2014-01-14 MED ORDER — HYDROCODONE-ACETAMINOPHEN 5-325 MG PO TABS: 1.0000 | ORAL_TABLET | Freq: Four times a day (QID) | ORAL | Status: DC | PRN

## 2014-01-14 MED ORDER — DEXAMETHASONE SODIUM PHOSPHATE 4 MG/ML IJ SOLN
INTRAMUSCULAR | Status: DC | PRN
  Administered 2014-01-14: 8 mg via INTRAVENOUS

## 2014-01-14 MED ORDER — OXYMETAZOLINE HCL 0.05 % NA SOLN
NASAL | Status: DC | PRN
  Administered 2014-01-14: 1

## 2014-01-14 MED ORDER — SUCCINYLCHOLINE CHLORIDE 20 MG/ML IJ SOLN
INTRAMUSCULAR | Status: DC | PRN
  Administered 2014-01-14: 60 mg via INTRAVENOUS

## 2014-01-14 MED ORDER — PROPOFOL 10 MG/ML IV BOLUS
INTRAVENOUS | Status: DC | PRN
  Administered 2014-01-14: 100 mg via INTRAVENOUS

## 2014-01-14 MED ORDER — MIDAZOLAM HCL 2 MG/2ML IJ SOLN: 1.0000 mg | INTRAMUSCULAR | Status: DC | PRN

## 2014-01-14 SURGICAL SUPPLY — 29 items
BLADE SURG 15 STRL LF DISP TIS (BLADE) IMPLANT
BLADE SURG 15 STRL SS (BLADE) ×3
CANISTER SUCT 1200ML W/VALVE (MISCELLANEOUS) ×2 IMPLANT
COVER MAYO STAND STRL (DRAPES) ×3 IMPLANT
ELECT COATED BLADE 2.86 ST (ELECTRODE) ×2 IMPLANT
ELECT NDL BLADE 2-5/6 (NEEDLE) IMPLANT
ELECT NEEDLE BLADE 2-5/6 (NEEDLE) IMPLANT
ELECT REM PT RETURN 9FT ADLT (ELECTROSURGICAL) ×3
ELECT REM PT RETURN 9FT PED (ELECTROSURGICAL)
ELECTRODE REM PT RETRN 9FT PED (ELECTROSURGICAL) IMPLANT
ELECTRODE REM PT RTRN 9FT ADLT (ELECTROSURGICAL) IMPLANT
GLOVE BIO SURGEON STRL SZ7.5 (GLOVE) ×3 IMPLANT
GLOVE SURG SS PI 7.0 STRL IVOR (GLOVE) ×2 IMPLANT
NEEDLE 27GAX1X1/2 (NEEDLE) ×2 IMPLANT
PACK BASIN DAY SURGERY FS (CUSTOM PROCEDURE TRAY) IMPLANT
PENCIL BUTTON HOLSTER BLD 10FT (ELECTRODE) ×5 IMPLANT
PENCIL FOOT CONTROL (ELECTRODE) IMPLANT
SHEET MEDIUM DRAPE 40X70 STRL (DRAPES) ×2 IMPLANT
SPONGE NEURO XRAY DETECT 1X3 (DISPOSABLE) ×1 IMPLANT
SPONGE TONSIL 1.25 RF SGL STRG (GAUZE/BANDAGES/DRESSINGS) ×2 IMPLANT
SUCTION FRAZIER TIP 10 FR DISP (SUCTIONS) IMPLANT
SUT CHROMIC 5 0 P 3 (SUTURE) IMPLANT
SUT VIC AB 4-0 RB1 27 (SUTURE) ×3
SUT VIC AB 4-0 RB1 27X BRD (SUTURE) IMPLANT
SYR CONTROL 10ML LL (SYRINGE) ×2 IMPLANT
TOWEL OR 17X24 6PK STRL BLUE (TOWEL DISPOSABLE) ×3 IMPLANT
TUBE CONNECTING 20'X1/4 (TUBING)
TUBE CONNECTING 20X1/4 (TUBING) IMPLANT
YANKAUER SUCT BULB TIP NO VENT (SUCTIONS) IMPLANT

## 2014-01-14 NOTE — Anesthesia Preprocedure Evaluation (Addendum)
Anesthesia Evaluation  Patient identified by MRN, date of birth, ID band Patient awake    Reviewed: Allergy & Precautions, H&P , NPO status , Patient's Chart, lab work & pertinent test results, reviewed documented beta blocker date and time   History of Anesthesia Complications Negative for: history of anesthetic complications  Airway Mallampati: II TM Distance: >3 FB Neck ROM: Full    Dental  (+) Partial Upper, Partial Lower, Dental Advisory Given   Pulmonary neg pulmonary ROS,  breath sounds clear to auscultation        Cardiovascular hypertension, Pt. on medications and Pt. on home beta blockers - angina+ CAD and + Cardiac Stents Rhythm:Regular Rate:Normal  '13 Stress test: normal perfusion without ischemia, EF 55%   Neuro/Psych negative neurological ROS     GI/Hepatic Neg liver ROS, GERD-  Medicated and Controlled,  Endo/Other  negative endocrine ROS  Renal/GU Renal InsufficiencyRenal disease (creat 1.81)     Musculoskeletal   Abdominal   Peds  Hematology   Anesthesia Other Findings   Reproductive/Obstetrics                         Anesthesia Physical Anesthesia Plan  ASA: III  Anesthesia Plan: General   Post-op Pain Management:    Induction: Intravenous  Airway Management Planned: Oral ETT  Additional Equipment:   Intra-op Plan:   Post-operative Plan: Extubation in OR  Informed Consent: I have reviewed the patients History and Physical, chart, labs and discussed the procedure including the risks, benefits and alternatives for the proposed anesthesia with the patient or authorized representative who has indicated his/her understanding and acceptance.   Dental advisory given  Plan Discussed with: CRNA and Surgeon  Anesthesia Plan Comments: (Plan routine monitors, GETA)        Anesthesia Quick Evaluation

## 2014-01-14 NOTE — Transfer of Care (Signed)
Immediate Anesthesia Transfer of Care Note  Patient: [name]  Procedure(s) Performed: Procedure(s): EXCISION OROPHARYNGEAL MASS (Right)  Patient Location: PACU  Anesthesia Type:General  Level of Consciousness: awake and alert   Airway & Oxygen Therapy: Patient Spontanous Breathing and aerosol face mask  Post-op Assessment: Report given to PACU RN and Post -op Vital signs reviewed and stable  Post vital signs: Reviewed and stable  Complications: No apparent anesthesia complications

## 2014-01-14 NOTE — Anesthesia Procedure Notes (Signed)
Procedure Name: Intubation Date/Time: 01/14/2014 10:29 AM Performed by: Lieutenant Diego Pre-anesthesia Checklist: Patient identified, Emergency Drugs available, Suction available and Patient being monitored Patient Re-evaluated:Patient Re-evaluated prior to inductionOxygen Delivery Method: Circle System Utilized Preoxygenation: Pre-oxygenation with 100% oxygen Intubation Type: IV induction Ventilation: Mask ventilation without difficulty Laryngoscope Size: Miller and 2 Grade View: Grade II Tube type: Oral Tube size: 6.0 mm Number of attempts: 2 Airway Equipment and Method: stylet,  oral airway and Bougie stylet Placement Confirmation: ETT inserted through vocal cords under direct vision,  positive ETCO2 and breath sounds checked- equal and bilateral Secured at: 22 cm Tube secured with: Tape Dental Injury: Teeth and Oropharynx as per pre-operative assessment and Injury to lip  Difficulty Due To: Difficulty was unanticipated and Difficult Airway- due to anterior larynx Future Recommendations: Recommend- induction with short-acting agent, and alternative techniques readily available Comments: DL with Miller 2, grade 2 view, attempt to pass ett, wouldn't go easily. Used blue stylet and passed with ease. Sats 98-100%. CJ at bedside Lower lip lacaration

## 2014-01-14 NOTE — Anesthesia Postprocedure Evaluation (Signed)
  Anesthesia Post-op Note  Patient: [name]  Procedure(s) Performed: Procedure(s): EXCISION OROPHARYNGEAL MASS (Right)  Patient Location: PACU  Anesthesia Type:General  Level of Consciousness: awake, alert , oriented and patient cooperative  Airway and Oxygen Therapy: Patient Spontanous Breathing  Post-op Pain: mild sore throat  Post-op Assessment: Post-op Vital signs reviewed, Patient's Cardiovascular Status Stable, Respiratory Function Stable, Patent Airway, No signs of Nausea or vomiting and Pain level controlled  Post-op Vital Signs: Reviewed and stable  Last Vitals:  Filed Vitals:   01/14/14 1130  BP:   Pulse: 66  Temp:   Resp: 14    Complications: No apparent anesthesia complications

## 2014-01-14 NOTE — H&P (Signed)
H&P Update  Pt's original H&P dated 01/02/14 reviewed and placed in chart (to be scanned).  I personally examined the patient today.  No change in health. Proceed with excision of right oropharyngeal mass.

## 2014-01-14 NOTE — Discharge Instructions (Addendum)
Patient may resume regular diet. No activity restriction. Patient will follow up in 1 week.   Post Anesthesia Home Care Instructions  Activity: Get plenty of rest for the remainder of the day. A responsible adult should stay with you for 24 hours following the procedure.  For the next 24 hours, DO NOT: -Drive a car -Paediatric nurse -Drink alcoholic beverages -Take any medication unless instructed by your physician -Make any legal decisions or sign important papers.  Meals: Start with liquid foods such as gelatin or soup. Progress to regular foods as tolerated. Avoid greasy, spicy, heavy foods. If nausea and/or vomiting occur, drink only clear liquids until the nausea and/or vomiting subsides. Call your physician if vomiting continues.  Special Instructions/Symptoms: Your throat may feel dry or sore from the anesthesia or the breathing tube placed in your throat during surgery. If this causes discomfort, gargle with warm salt water. The discomfort should disappear within 24 hours.

## 2014-01-14 NOTE — Brief Op Note (Signed)
01/14/2014  10:56 AM  PATIENT:  Aleksa C Browning  80 y.o. female  PRE-OPERATIVE DIAGNOSIS:  OROPHARYNGEAL MASS  POST-OPERATIVE DIAGNOSIS:  OROPHARYNGEAL MASS  PROCEDURE:  Procedure(s): EXCISION OROPHARYNGEAL MASS (N/A)  SURGEON:  Surgeon(s) and Role:    * Ascencion Dike, MD - Primary  PHYSICIAN ASSISTANT:   ASSISTANTS: none   ANESTHESIA:   general  EBL:  Total I/O In: 500 [I.V.:500] Out: -   BLOOD ADMINISTERED:none  DRAINS: none   LOCAL MEDICATIONS USED:  LIDOCAINE   SPECIMEN:  Source of Specimen:  Right oropharyngeal mass  DISPOSITION OF SPECIMEN:  PATHOLOGY  COUNTS:  YES  TOURNIQUET:  * No tourniquets in log *  DICTATION: .Other Dictation: Dictation Number [id number]  PLAN OF CARE: Discharge to home after PACU  PATIENT DISPOSITION:  PACU - hemodynamically stable.   Delay start of Pharmacological VTE agent (>24hrs) due to surgical blood loss or risk of bleeding: not applicable

## 2014-01-15 ENCOUNTER — Encounter (HOSPITAL_BASED_OUTPATIENT_CLINIC_OR_DEPARTMENT_OTHER): Payer: Self-pay | Admitting: Otolaryngology

## 2014-01-15 NOTE — Op Note (Signed)
[name], [name]                 ACCOUNT NO.:  [id number]  MEDICAL RECORD NO.:  [id number]  LOCATION:                                 FACILITY:  PHYSICIAN:  Leta Baptist, MD            DATE OF BIRTH:  [birth year]  DATE OF PROCEDURE:  01/14/2014 DATE OF DISCHARGE:  01/14/2014                              OPERATIVE REPORT   SURGEON:  Leta Baptist, MD  PREOPERATIVE DIAGNOSIS:  Right oropharyngeal mass.  POSTOPERATIVE DIAGNOSIS:  Right oropharyngeal mass.  PROCEDURE PERFORMED:  Excision of right oropharyngeal mass.  ANESTHESIA:  General endotracheal tube anesthesia.  COMPLICATIONS:  None.  ESTIMATED BLOOD LOSS:  Minimal.  INDICATION FOR PROCEDURE:  The patient is a 80 year old female, who presented to my office last week complaining of right oropharyngeal mass.  She was previously seen by her dentist, and was noted to have a mass.  The patient would like to have the mass removed.  On examination, the patient was noted to have 1 cm fungating mass over the right retromolar trigone.  The appearance of the mass was suggestive of papilloma.  No surrounding erythema or edema was noted.  The patient was asymptomatic.  She denied any dysphagia, odynophagia, or dysphonia. Based on the physical exam findings, the decision was made for the patient to undergo the above-stated procedure.  The risks, benefits, alternatives, and details of the procedure were discussed.  Questions were invited and answered.  Informed consent was obtained.  DESCRIPTION OF PROCEDURE:  The patient was taken to the operating room and placed supine on the operating table.  General endotracheal tube anesthesia was administered by the anesthesiologist.  The patient was positioned and prepped and draped in standard fashion for her oropharyngeal surgery.  A mouth gag was placed.  Her tongue was retracted to the left side.  A 1-cm fungating mass was noted on the right retromolar trigone.  Lidocaine 1% with 1:100,000  epinephrine was infiltrated locally.  An elliptical incision was made around the mass. The entire mass, together with the underlying mucosal soft tissue were removed.  The entire specimen was sent to the Pathology Department for permanent histologic identification.  The surgical site was copiously irrigated.  It was closed with interrupted 4-0 Vicryl sutures.  The care of the patient was turned over to the anesthesiologist.  The patient was awakened from anesthesia without difficulty.  She was extubated and transferred to the recovery room in good condition.  FINDINGS:  A 1-cm fungating mass was noted on the right retromolar trigone.  SPECIMEN:  Right oropharyngeal mass.  FOLLOWUP CARE:  The patient will be discharged home once she is awake and alert.  She will be placed on Vicodin p.r.n. pain.  The patient will follow up in my office in 1 week.     Leta Baptist, MD     ST/MEDQ  D:  01/14/2014  T:  01/14/2014  Job:  [id number]

## 2014-01-23 ENCOUNTER — Ambulatory Visit (INDEPENDENT_AMBULATORY_CARE_PROVIDER_SITE_OTHER): Payer: Medicare Other | Admitting: Otolaryngology

## 2014-02-19 ENCOUNTER — Other Ambulatory Visit (HOSPITAL_COMMUNITY): Payer: Self-pay | Admitting: Family Medicine

## 2014-02-19 DIAGNOSIS — Z0000 Encounter for general adult medical examination without abnormal findings: Secondary | ICD-10-CM

## 2014-02-24 ENCOUNTER — Ambulatory Visit (HOSPITAL_COMMUNITY)
Admission: RE | Admit: 2014-02-24 | Discharge: 2014-02-24 | Disposition: A | Discharge status: Home / Self Care | Payer: Medicare Other | Source: Ambulatory Visit | Attending: Family Medicine | Admitting: Family Medicine

## 2014-02-24 DIAGNOSIS — Z1231 Encounter for screening mammogram for malignant neoplasm of breast: Secondary | ICD-10-CM | POA: Insufficient documentation

## 2014-02-24 DIAGNOSIS — Z0000 Encounter for general adult medical examination without abnormal findings: Secondary | ICD-10-CM

## 2014-04-11 ENCOUNTER — Other Ambulatory Visit: Payer: Self-pay | Admitting: Cardiovascular Disease

## 2014-04-11 NOTE — Telephone Encounter (Signed)
Rx has been sent to the pharmacy electronically. ° °

## 2014-05-12 ENCOUNTER — Ambulatory Visit (INDEPENDENT_AMBULATORY_CARE_PROVIDER_SITE_OTHER): Payer: Medicare Other | Admitting: Cardiovascular Disease

## 2014-05-12 ENCOUNTER — Encounter: Payer: Self-pay | Admitting: Cardiovascular Disease

## 2014-05-12 VITALS — Ht 62.0 in | Wt 138.3 lb

## 2014-05-12 DIAGNOSIS — I2510 Atherosclerotic heart disease of native coronary artery without angina pectoris: Secondary | ICD-10-CM

## 2014-05-12 DIAGNOSIS — Z87448 Personal history of other diseases of urinary system: Secondary | ICD-10-CM

## 2014-05-12 DIAGNOSIS — I10 Essential (primary) hypertension: Secondary | ICD-10-CM

## 2014-05-12 DIAGNOSIS — I447 Left bundle-branch block, unspecified: Secondary | ICD-10-CM | POA: Diagnosis not present

## 2014-05-12 MED ORDER — AMLODIPINE BESYLATE 5 MG PO TABS: ORAL_TABLET | ORAL | Status: DC

## 2014-05-12 NOTE — Patient Instructions (Addendum)
Your physician has recommended you make the following change in your medication: increase the amlodipine 5 mg to 1 tablet in the morning and 1/2 tablet evening  Increase the isosorbide to 1/2 tablet daily.  Your physician has requested that you have an echocardiogram. Echocardiography is a painless test that uses sound waves to create images of your heart. It provides your doctor with information about the size and shape of your heart and how well your heart's chambers and valves are working. This procedure takes approximately one hour. There are no restrictions for this procedure.  Your physician has requested that you have a lexiscan myoview. For further information please visit [email or web address]. Please follow instruction sheet, as given.  Your physician recommends that you return for lab work fasting. Use the lab slips that has been provided for you today.  Your physician recommends that you schedule a follow-up appointment in: 3-4 months.

## 2014-05-12 NOTE — Progress Notes (Signed)
Patient ID: [name], female   DOB: 1934-02-14, 81 y.o.   MRN: [id number]     HPI: [name] is a 81 y.o. female who presents to a one year cardiology evaluation.  [name] has a history of left bundle branch block and established coronary artery disease. In July 2012 she underwent very difficult complex but successful high-speed rotational atherectomy of a very calcified LAD which was severely diffusely diseased. Subsequently, she has noticed marked improvement in symptomatology since that intervention. She has a history of hypertension, intermittent ankle swelling, anxiety as well as stress.  She also has renal insufficiency and laboratory in 2012 had revealed a creatinine of 1.86.   She also has a history of GERD.  Over the past year, she states that she has done well with reference to her prior angina.  Prior to her complex intervention.  She was unable to walk a minimal distance without significant shortness of breath, any chest fullness.  Proximate 46 months ago.  Repeat blood work was done by Dr. Hilma Favors.  She has been on medical regimen consisting of amlodipine 5 mg but has been taking this at one half pill twice a day.  She also was supposed to be on isosorbide mononitrate at 120, but she is taking only one third of a tablet daily.  She does have history of anxiety for which he takes Xanax as needed.  She denies recent GERD symptoms.  She enjoys dancing and typically goes dancing several days per week.  Past Medical History  Diagnosis Date  . Hyperlipidemia   . Hypertension     x 5-6 yrs.  . Colon polyps   . Anemia   . Chronic kidney disease   . Bleeding from the nose Oct 2014  . CAD (coronary artery disease)   . LBBB (left bundle branch block)   . Anxiety   . Cataract of both eyes   . Arthritis     Past Surgical History  Procedure Laterality Date  . Partial hysterectomy    . Coronary angioplasty with stent placement  11/08/2010    complex high-speed rotational  atherectomy of calcified LAD, diffusely disease LAD system (Dr. Corky Downs)  . Colonoscopy N/A 01/16/2013    Procedure: COLONOSCOPY;  Surgeon: Rogene Houston, MD;  [facility];  Service: Endoscopy;  Laterality: N/A;  1200  . Transthoracic echocardiogram  10/2010    EF 45-50%, mod conc LVH, grade 1 diastolic dysfunction, mildly calcified left AV cusp; calcified MV annulus  . Nm myocar perf wall motion  05/2011    lexiscan myoview; normal pattern of perfusion in all regions; post-stress EF 55%; low risk scan   . Cardiac catheterization  06/20/2008    severe single vessel high-grade stenosis of mid LAD at bifurcation of diagonal 1 and setpal perforator 1; diffuse coronary calcification of L coronary system; 50% stenosis of mid RCA (Dr. Jackie Plum)  . Breast surgery      masses removed while she was pregnant  . Tonsillectomy    . Cataract extraction w/phaco Right 10/10/2013    Procedure: CATARACT EXTRACTION PHACO AND INTRAOCULAR LENS PLACEMENT (IOC);  Surgeon: Tonny Branch, MD;  Location: [facility];  Service: Ophthalmology;  Laterality: Right;  CDE:11.96  . Cataract extraction w/phaco Left 11/18/2013    Procedure: CATARACT EXTRACTION PHACO AND INTRAOCULAR LENS PLACEMENT (IOC);  Surgeon: Tonny Branch, MD;  Location: [facility];  Service: Ophthalmology;  Laterality: Left;  CDE 15.96  . Excision oral tumor Right  01/14/2014    Procedure: EXCISION OROPHARYNGEAL MASS;  Surgeon: Ascencion Dike, MD;  Location: [place];  Service: ENT;  Laterality: Right;    No Known Allergies  Current Outpatient Prescriptions  Medication Sig Dispense Refill  . ALPRAZolam (XANAX) 0.5 MG tablet Take 0.25 mg by mouth at bedtime as needed for sleep.     Marland Kitchen amLODipine (NORVASC) 5 MG tablet Take 1 tablet (5 mg total) by mouth daily. Needs appointment before anymore refills 30 tablet 1  . aspirin 81 MG tablet Take 81 mg by mouth daily.     . carvedilol (COREG) 25 MG tablet 25 mg daily. 1/2 tablet  daily.    .  cholecalciferol (VITAMIN D) 1000 UNITS tablet Take 1,000 Units by mouth daily.    Marland Kitchen FLAXSEED, LINSEED, PO Take 1 capsule by mouth daily.    . hydrocortisone (ANUSOL-HC) 25 MG suppository Place 1 suppository (25 mg total) rectally at bedtime. 14 suppository 1  . isosorbide mononitrate (IMDUR) 120 MG 24 hr tablet Take 120 mg by mouth. 1/3 tablet daily    . Multiple Vitamin (MULTIVITAMIN) tablet Take 1 tablet by mouth daily.    . vitamin B-12 (CYANOCOBALAMIN) 250 MCG tablet Take 2,500 mcg by mouth daily.     No current facility-administered medications for this visit.    History   Social History  . Marital Status: Divorced    Spouse Name: N/A    Number of Children: 1  . Years of Education: 8   Occupational History  . Not on file.   Social History Main Topics  . Smoking status: Never Smoker   . Smokeless tobacco: Never Used  . Alcohol Use: Yes     Comment: occ wine  . Drug Use: No  . Sexual Activity: Yes    Birth Control/ Protection: Surgical   Other Topics Concern  . Not on file   Social History Narrative   Additional social history is notable in that she is single and has 4 children. In the past she had significant stress and anxiety with her separation. There is no tobacco use or alcohol use.  Family History  Problem Relation Age of Onset  . Colon cancer Neg Hx   . Liver cancer Mother   . Cancer Father   . Heart Problems Brother   . Coronary artery disease Sister     stent  . Heart Problems Daughter     also stomach problems  . COPD Daughter    ROS General: Negative; No fevers, chills, or night sweats;  HEENT: Negative; No changes in vision or hearing, sinus congestion, difficulty swallowing Pulmonary: Negative; No cough, wheezing, shortness of breath, hemoptysis Cardiovascular: See history of present illness: No awareness of palpitations.  No presyncope or syncope.  No PND orthopnea GI: Negative; No nausea, vomiting, diarrhea, or abdominal pain GU: Negative; No  dysuria, hematuria, or difficulty voiding Musculoskeletal: Negative; no myalgias, joint pain, or weakness Hematologic/Oncology: Negative; no easy bruising, bleeding Endocrine: Negative; no heat/cold intolerance; no diabetes Neuro: Negative; no changes in balance, headaches Skin: Negative; No rashes or skin lesions Psychiatric: Negative; No behavioral problems, depression Sleep: Negative; No snoring, daytime sleepiness, hypersomnolence, bruxism, restless legs, hypnogognic hallucinations, no cataplexy Other comprehensive 14 point system review is negative.   PE Ht 5\' 2"  (1.575 m)  Wt 138 lb 4.8 oz (62.732 kg)  BMI 25.29 kg/m2  Repeat blood pressure was 180/92. General: Alert, oriented, no distress.  Skin: normal turgor, no rashes HEENT: Normocephalic, atraumatic. Pupils  round and reactive; sclera anicteric;no lid lag.  Nose without nasal septal hypertrophy Mouth/Parynx benign; Mallinpatti scale 2 Neck: No JVD, no carotid bruits; normal carotid upstroke Lungs: clear to ausculatation and percussion; no wheezing or rales Chest wall: no tenderness to palpitation Heart: RRR, s1 s2 normal 1/6 systolic murmur; no diastolic murmur.  No S3, S4 gallop.  No rubs thrills or heaves. Abdomen: soft, nontender; no hepatosplenomehaly, BS+; abdominal aorta nontender and not dilated by palpation. Back: no CVA tenderness Pulses 2+ Extremities: no clubbing cyanosis or edema, Homan's sign negative  Neurologic: grossly nonfocal Psychologic: normal affect and mood.  ECG (independently read by me and (: Normal sinus rhythm at 63 bpm.  Left bundle branch block with repolarization changes.  QTc interval 454 ms.  Prior December 2014 ECG: Sinus rhythm with left bundle branch block and associated repolarization changes.  LABS:  BMET    Component Value Date/Time   NA 142 01/08/2014 1245   K 5.4* 01/08/2014 1245   CL 104 01/08/2014 1245   CO2 28 01/08/2014 1245   GLUCOSE 118* 01/08/2014 1245   BUN 29*  01/08/2014 1245   CREATININE 1.81* 01/08/2014 1245   CALCIUM 9.2 01/08/2014 1245   GFRNONAA 26* 01/08/2014 1245   GFRAA 30* 01/08/2014 1245     Hepatic Function Panel     Component Value Date/Time   PROT 6.4 11/08/2010 0625   ALBUMIN 3.4* 11/08/2010 0625   AST 20 11/08/2010 0625   ALT 16 11/08/2010 0625   ALKPHOS 73 11/08/2010 0625   BILITOT 0.4 11/08/2010 0625     CBC    Component Value Date/Time   WBC 4.3 02/21/2013 1100   RBC 3.36* 02/21/2013 1100   RBC 3.36* 02/21/2013 1100   HGB 11.0* 01/14/2014 0919   HCT 32.5* 10/04/2013 1000   PLT 182 02/21/2013 1100   MCV 103.9* 02/21/2013 1100   MCH 34.2* 02/21/2013 1100   MCHC 33.0 02/21/2013 1100   RDW 12.5 02/21/2013 1100   LYMPHSABS 1.0 02/21/2013 1100   MONOABS 0.4 02/21/2013 1100   EOSABS 0.2 02/21/2013 1100   BASOSABS 0.0 02/21/2013 1100     BNP No results found for: PROBNP  Lipid Panel  No results found for: CHOL   RADIOLOGY: No results found.    ASSESSMENT AND PLAN: Ms. Preast is a 81 year old female who is 3-1/2 years status post complex but successful high-speed rotational atherectomy of a severely diffusely diseased proximal to mid LAD.  She continues to note significant benefit since that procedure.  With reference to her anginal symptomatology.  Her blood pressure today is elevated and I will repeat by me was 178/88.  I'm electing to further titrate her amlodipine to 7.5 mg daily.  We had reduced her isosorbide to just 45 mg daily and I recommended she at least increase this to 60 mg.  Her resting pulse is in the low 60s on her current dose of carvedilol at 12.50 g twice a day.  She does admit to some anxiety for which he takes Xanax.  In the past.  She did have renal insufficiency.  I am recommending complete set of blood work be drawn in the fasting state.  It is been 3-1/2 years since her last echo Doppler study in 3 years since her last nuclear perfusion scan.  With her complex coronary anatomy.  I am  recommending subsequent nuclear imaging and an echo Doppler assessment for reassessment of LV size and function, valvular architecture, and to make certain she's not developed  any ischemia/scar.  These will be set up at her convenience over the next 1-2 months with plans for office follow-up evaluation in 3 months.   Troy Sine, MD, [facility]  05/12/2014 12:20 PM

## 2014-05-13 DIAGNOSIS — I2510 Atherosclerotic heart disease of native coronary artery without angina pectoris: Secondary | ICD-10-CM | POA: Diagnosis not present

## 2014-05-13 DIAGNOSIS — I10 Essential (primary) hypertension: Secondary | ICD-10-CM | POA: Diagnosis not present

## 2014-05-13 LAB — LIPID PANEL
Cholesterol: 164 mg/dL (ref 0–200)
HDL: 46 mg/dL (ref >39)
LDL CALC: 94 mg/dL (ref 0–99)
TRIGLYCERIDES: 118 mg/dL (ref <150)
Total CHOL/HDL Ratio: 3.6 Ratio
VLDL: 24 mg/dL (ref 0–40)

## 2014-05-13 LAB — COMPREHENSIVE METABOLIC PANEL
ALK PHOS: 69 U/L (ref 39–117)
ALT: 20 U/L (ref 0–35)
AST: 23 U/L (ref 0–37)
Albumin: 3.8 g/dL (ref 3.5–5.2)
BILIRUBIN TOTAL: 0.4 mg/dL (ref 0.2–1.2)
BUN: 25 mg/dL — AB (ref 6–23)
CO2: 26 mEq/L (ref 19–32)
Calcium: 8.8 mg/dL (ref 8.4–10.5)
Chloride: 108 mEq/L (ref 96–112)
Creat: 1.49 mg/dL — ABNORMAL HIGH (ref 0.50–1.10)
GLUCOSE: 93 mg/dL (ref 70–99)
Potassium: 4.3 mEq/L (ref 3.5–5.3)
Sodium: 144 mEq/L (ref 135–145)
Total Protein: 6.3 g/dL (ref 6.0–8.3)

## 2014-05-13 LAB — CBC
HEMATOCRIT: 35.5 % — AB (ref 36.0–46.0)
HEMOGLOBIN: 11.6 g/dL — AB (ref 12.0–15.0)
MCH: 33.9 pg (ref 26.0–34.0)
MCHC: 32.7 g/dL (ref 30.0–36.0)
MCV: 103.8 fL — ABNORMAL HIGH (ref 78.0–100.0)
MPV: 9 fL (ref 8.6–12.4)
Platelets: 174 10*3/uL (ref 150–400)
RBC: 3.42 MIL/uL — AB (ref 3.87–5.11)
RDW: 12.9 % (ref 11.5–15.5)
WBC: 4.2 10*3/uL (ref 4.0–10.5)

## 2014-05-13 LAB — TSH: TSH: 2.162 u[IU]/mL (ref 0.350–4.500)

## 2014-06-09 DIAGNOSIS — H26493 Other secondary cataract, bilateral: Secondary | ICD-10-CM | POA: Diagnosis not present

## 2014-06-09 DIAGNOSIS — H26492 Other secondary cataract, left eye: Secondary | ICD-10-CM | POA: Diagnosis not present

## 2014-06-09 DIAGNOSIS — H26491 Other secondary cataract, right eye: Secondary | ICD-10-CM | POA: Diagnosis not present

## 2014-06-11 ENCOUNTER — Telehealth: Payer: Self-pay | Admitting: *Deleted

## 2014-06-12 ENCOUNTER — Ambulatory Visit (HOSPITAL_COMMUNITY)
Admission: RE | Admit: 2014-06-12 | Discharge: 2014-06-12 | Disposition: A | Discharge status: Home / Self Care | Payer: Medicare Other | Source: Ambulatory Visit | Attending: Family Medicine | Admitting: Family Medicine

## 2014-06-12 ENCOUNTER — Other Ambulatory Visit (HOSPITAL_COMMUNITY): Payer: Self-pay | Admitting: Family Medicine

## 2014-06-12 DIAGNOSIS — Z6823 Body mass index (BMI) 23.0-23.9, adult: Secondary | ICD-10-CM | POA: Diagnosis not present

## 2014-06-12 DIAGNOSIS — M25562 Pain in left knee: Secondary | ICD-10-CM

## 2014-06-12 DIAGNOSIS — M25462 Effusion, left knee: Secondary | ICD-10-CM | POA: Diagnosis not present

## 2014-06-17 NOTE — Telephone Encounter (Signed)
Refilled patients atenolol. She was informed by Dalene Seltzer this would only be refilled until she can come to her April appointment.

## 2014-06-25 ENCOUNTER — Other Ambulatory Visit: Payer: Self-pay | Admitting: Cardiovascular Disease

## 2014-06-25 NOTE — Telephone Encounter (Signed)
Rx has been sent to the pharmacy electronically. ° °

## 2014-07-04 ENCOUNTER — Other Ambulatory Visit: Payer: Self-pay | Admitting: *Deleted

## 2014-07-04 DIAGNOSIS — R899 Unspecified abnormal finding in specimens from other organs, systems and tissues: Secondary | ICD-10-CM

## 2014-07-08 DIAGNOSIS — R899 Unspecified abnormal finding in specimens from other organs, systems and tissues: Secondary | ICD-10-CM | POA: Diagnosis not present

## 2014-07-09 LAB — VITAMIN B12: Vitamin B-12: >2000 pg/mL — ABNORMAL HIGH (ref 211–911)

## 2014-07-09 LAB — FOLATE: Folate: >20 ng/mL

## 2014-07-10 ENCOUNTER — Encounter: Payer: Self-pay | Admitting: *Deleted

## 2014-07-31 ENCOUNTER — Ambulatory Visit (INDEPENDENT_AMBULATORY_CARE_PROVIDER_SITE_OTHER): Payer: Medicare Other | Admitting: Otolaryngology

## 2014-07-31 DIAGNOSIS — R040 Epistaxis: Secondary | ICD-10-CM | POA: Diagnosis not present

## 2014-09-04 ENCOUNTER — Ambulatory Visit (INDEPENDENT_AMBULATORY_CARE_PROVIDER_SITE_OTHER): Payer: Medicare Other | Admitting: Otolaryngology

## 2014-09-04 DIAGNOSIS — R040 Epistaxis: Secondary | ICD-10-CM | POA: Diagnosis not present

## 2014-09-04 DIAGNOSIS — J310 Chronic rhinitis: Secondary | ICD-10-CM | POA: Diagnosis not present

## 2014-10-09 DIAGNOSIS — H524 Presbyopia: Secondary | ICD-10-CM | POA: Diagnosis not present

## 2014-10-09 DIAGNOSIS — H1132 Conjunctival hemorrhage, left eye: Secondary | ICD-10-CM | POA: Diagnosis not present

## 2014-11-25 ENCOUNTER — Other Ambulatory Visit (HOSPITAL_COMMUNITY): Payer: Self-pay | Admitting: Family Medicine

## 2014-11-25 DIAGNOSIS — E785 Hyperlipidemia, unspecified: Secondary | ICD-10-CM | POA: Diagnosis not present

## 2014-11-25 DIAGNOSIS — M810 Age-related osteoporosis without current pathological fracture: Secondary | ICD-10-CM | POA: Diagnosis not present

## 2014-11-25 DIAGNOSIS — N183 Chronic kidney disease, stage 3 (moderate): Secondary | ICD-10-CM | POA: Diagnosis not present

## 2014-11-25 DIAGNOSIS — Z79899 Other long term (current) drug therapy: Secondary | ICD-10-CM

## 2014-11-25 DIAGNOSIS — E782 Mixed hyperlipidemia: Secondary | ICD-10-CM | POA: Diagnosis not present

## 2014-11-25 DIAGNOSIS — D649 Anemia, unspecified: Secondary | ICD-10-CM | POA: Diagnosis not present

## 2014-11-25 DIAGNOSIS — Z6823 Body mass index (BMI) 23.0-23.9, adult: Secondary | ICD-10-CM | POA: Diagnosis not present

## 2014-11-25 DIAGNOSIS — I10 Essential (primary) hypertension: Secondary | ICD-10-CM | POA: Diagnosis not present

## 2014-11-25 DIAGNOSIS — Z0000 Encounter for general adult medical examination without abnormal findings: Secondary | ICD-10-CM | POA: Diagnosis not present

## 2014-11-25 DIAGNOSIS — Z1389 Encounter for screening for other disorder: Secondary | ICD-10-CM | POA: Diagnosis not present

## 2014-12-02 ENCOUNTER — Ambulatory Visit (HOSPITAL_COMMUNITY)
Admission: RE | Admit: 2014-12-02 | Discharge: 2014-12-02 | Disposition: A | Discharge status: Home / Self Care | Payer: Medicare Other | Source: Ambulatory Visit | Attending: Family Medicine | Admitting: Family Medicine

## 2014-12-02 DIAGNOSIS — Z780 Asymptomatic menopausal state: Secondary | ICD-10-CM | POA: Insufficient documentation

## 2014-12-02 DIAGNOSIS — Z79899 Other long term (current) drug therapy: Secondary | ICD-10-CM

## 2014-12-02 DIAGNOSIS — M810 Age-related osteoporosis without current pathological fracture: Secondary | ICD-10-CM | POA: Diagnosis not present

## 2014-12-02 DIAGNOSIS — M8580 Other specified disorders of bone density and structure, unspecified site: Secondary | ICD-10-CM | POA: Insufficient documentation

## 2014-12-25 ENCOUNTER — Other Ambulatory Visit: Payer: Self-pay | Admitting: Cardiovascular Disease

## 2014-12-26 ENCOUNTER — Other Ambulatory Visit: Payer: Self-pay | Admitting: Cardiovascular Disease

## 2014-12-26 MED ORDER — ISOSORBIDE MONONITRATE ER 120 MG PO TB24: ORAL_TABLET | ORAL | Status: DC

## 2014-12-26 NOTE — Telephone Encounter (Signed)
°  1. Which medications need to be refilled? Isosorbide-new prescriptiom  2. Which pharmacy is medication to be sent to?[phone]  3. Do they need a 30 day or 90 day supply?90 and refills  4. Would they like a call back once the medication has been sent to the pharmacy? yes

## 2014-12-26 NOTE — Telephone Encounter (Signed)
Rx(s) sent to pharmacy electronically.  

## 2015-01-16 ENCOUNTER — Ambulatory Visit (INDEPENDENT_AMBULATORY_CARE_PROVIDER_SITE_OTHER): Payer: Medicare Other | Admitting: Cardiovascular Disease

## 2015-01-16 ENCOUNTER — Encounter: Payer: Self-pay | Admitting: Cardiovascular Disease

## 2015-01-16 VITALS — BP 158/80 | HR 75 | Ht 61.0 in | Wt 143.4 lb

## 2015-01-16 DIAGNOSIS — I119 Hypertensive heart disease without heart failure: Secondary | ICD-10-CM

## 2015-01-16 DIAGNOSIS — I447 Left bundle-branch block, unspecified: Secondary | ICD-10-CM | POA: Diagnosis not present

## 2015-01-16 DIAGNOSIS — K219 Gastro-esophageal reflux disease without esophagitis: Secondary | ICD-10-CM

## 2015-01-16 DIAGNOSIS — I25810 Atherosclerosis of coronary artery bypass graft(s) without angina pectoris: Secondary | ICD-10-CM | POA: Diagnosis not present

## 2015-01-16 DIAGNOSIS — I10 Essential (primary) hypertension: Secondary | ICD-10-CM | POA: Diagnosis not present

## 2015-01-16 MED ORDER — HYDROCHLOROTHIAZIDE 12.5 MG PO CAPS: 12.5000 mg | ORAL_CAPSULE | ORAL | Status: DC

## 2015-01-16 NOTE — Patient Instructions (Addendum)
Your physician has requested that you have a lexiscan myoview. For further information please visit [email or web address]. Please follow instruction sheet, as given.  Your physician has recommended you make the following change in your medication: start new prescription for hydrochlorothiazide. This has been sent to your  Pharmacy.  Your physician has requested that you have an echocardiogram. Echocardiography is a painless test that uses sound waves to create images of your heart. It provides your doctor with information about the size and shape of your heart and how well your heart's chambers and valves are working. This procedure takes approximately one hour. There are no restrictions for this procedure.    Your physician wants you to follow-up in: 6 months or sooner if needed with [name]. You will receive a reminder letter in the mail two months in advance. If you don't receive a letter, please call our office to schedule the follow-up appointment.

## 2015-01-17 ENCOUNTER — Encounter: Payer: Self-pay | Admitting: Cardiovascular Disease

## 2015-01-17 DIAGNOSIS — I119 Hypertensive heart disease without heart failure: Secondary | ICD-10-CM | POA: Insufficient documentation

## 2015-01-17 NOTE — Progress Notes (Signed)
Patient ID: [name], female   DOB: May 13, 1933, 81 y.o.   MRN: [id number]     HPI: [name] is a 81 y.o. female who presents to a 8 month follow-up cardiology evaluation.  [name] has a history of left bundle branch block and established coronary artery disease. In July 2012 she underwent a very difficult complex but successful high-speed rotational atherectomy of a very calcified LAD which was severely diffusely diseased. Subsequently, she has noticed marked improvement in symptomatology since that intervention. She has a history of hypertension, intermittent ankle swelling, anxiety as well as stress.  She also has renal insufficiency and laboratory in 2012 had revealed a creatinine of 1.86.   She also has a history of GERD.   she denies recent development of angina symptomatology on her medical regimen consisting of isosorbide 120 mg, carvedilol, which she is only been taking 12.5 mg once a day, and amlodipine 5 mg daily. She does note occasional ankle swelling, particularly left greater than right. She does have a history of anxiety. She denies palpitations.  She denies presyncope or syncope. A renal duplex scan in 2013, was fairly normal. She has GERD. She presents for evaluation.  Past Medical History  Diagnosis Date  . Hyperlipidemia   . Hypertension     x 5-6 yrs.  . Colon polyps   . Anemia   . Chronic kidney disease   . Bleeding from the nose Oct 2014  . CAD (coronary artery disease)   . LBBB (left bundle branch block)   . Anxiety   . Cataract of both eyes   . Arthritis     Past Surgical History  Procedure Laterality Date  . Partial hysterectomy    . Coronary angioplasty with stent placement  11/08/2010    complex high-speed rotational atherectomy of calcified LAD, diffusely disease LAD system (Dr. Corky Downs)  . Colonoscopy N/A 01/16/2013    Procedure: COLONOSCOPY;  Surgeon: Rogene Houston, MD;  [facility];  Service: Endoscopy;  Laterality: N/A;  1200  .  Transthoracic echocardiogram  10/2010    EF 45-50%, mod conc LVH, grade 1 diastolic dysfunction, mildly calcified left AV cusp; calcified MV annulus  . Nm myocar perf wall motion  05/2011    lexiscan myoview; normal pattern of perfusion in all regions; post-stress EF 55%; low risk scan   . Cardiac catheterization  06/20/2008    severe single vessel high-grade stenosis of mid LAD at bifurcation of diagonal 1 and setpal perforator 1; diffuse coronary calcification of L coronary system; 50% stenosis of mid RCA (Dr. Jackie Plum)  . Breast surgery      masses removed while she was pregnant  . Tonsillectomy    . Cataract extraction w/phaco Right 10/10/2013    Procedure: CATARACT EXTRACTION PHACO AND INTRAOCULAR LENS PLACEMENT (IOC);  Surgeon: Tonny Branch, MD;  Location: [facility];  Service: Ophthalmology;  Laterality: Right;  CDE:11.96  . Cataract extraction w/phaco Left 11/18/2013    Procedure: CATARACT EXTRACTION PHACO AND INTRAOCULAR LENS PLACEMENT (IOC);  Surgeon: Tonny Branch, MD;  Location: [facility];  Service: Ophthalmology;  Laterality: Left;  CDE 15.96  . Excision oral tumor Right 01/14/2014    Procedure: EXCISION OROPHARYNGEAL MASS;  Surgeon: Ascencion Dike, MD;  Location: [facility];  Service: ENT;  Laterality: Right;    No Known Allergies  Current Outpatient Prescriptions  Medication Sig Dispense Refill  . ALPRAZolam (XANAX) 0.5 MG tablet Take 0.25 mg by mouth at  bedtime as needed for sleep.     Marland Kitchen amLODipine (NORVASC) 5 MG tablet Take 5 mg by mouth daily.    Marland Kitchen aspirin 81 MG tablet Take 81 mg by mouth daily.     . carvedilol (COREG) 25 MG tablet 25 mg daily. 1/2 tablet  daily.    . cholecalciferol (VITAMIN D) 1000 UNITS tablet Take 1,000 Units by mouth daily.    Marland Kitchen FLAXSEED, LINSEED, PO Take 1 capsule by mouth daily.    . hydrocortisone (ANUSOL-HC) 25 MG suppository Place 1 suppository (25 mg total) rectally at bedtime. 14 suppository 1  . isosorbide mononitrate (IMDUR) 120 MG 24 hr tablet  Take 1 tablet by mouth daily as directed. 30 tablet 3  . ketoconazole (NIZORAL) 2 % cream Apply 1 application topically daily.    . Multiple Vitamin (MULTIVITAMIN) tablet Take 1 tablet by mouth daily.    . vitamin B-12 (CYANOCOBALAMIN) 250 MCG tablet Take 2,500 mcg by mouth daily.    . hydrochlorothiazide (MICROZIDE) 12.5 MG capsule Take 1 capsule (12.5 mg total) by mouth every other day. 30 capsule 3   No current facility-administered medications for this visit.    Social History   Social History  . Marital Status: Divorced    Spouse Name: N/A  . Number of Children: 4  . Years of Education: 8   Occupational History  . Not on file.   Social History Main Topics  . Smoking status: Never Smoker   . Smokeless tobacco: Never Used  . Alcohol Use: Yes     Comment: occ wine  . Drug Use: No  . Sexual Activity: Yes    Birth Control/ Protection: Surgical   Other Topics Concern  . Not on file   Social History Narrative   Additional social history is notable in that she is single and has 4 children. In the past she had significant stress and anxiety with her separation. There is no tobacco use or alcohol use.  Family History  Problem Relation Age of Onset  . Colon cancer Neg Hx   . Liver cancer Mother   . Cancer Father   . Heart Problems Brother   . Coronary artery disease Sister     stent  . Heart Problems Daughter     also stomach problems  . COPD Daughter    ROS General: Negative; No fevers, chills, or night sweats;  HEENT: Negative; No changes in vision or hearing, sinus congestion, difficulty swallowing Pulmonary: Negative; No cough, wheezing, shortness of breath, hemoptysis Cardiovascular: See history of present illness: No awareness of palpitations.  No presyncope or syncope.  No PND orthopnea GI: Negative; No nausea, vomiting, diarrhea, or abdominal pain GU: Negative; No dysuria, hematuria, or difficulty voiding Musculoskeletal: Negative; no myalgias, joint pain, or  weakness Hematologic/Oncology: Negative; no easy bruising, bleeding Endocrine: Negative; no heat/cold intolerance; no diabetes Neuro: Negative; no changes in balance, headaches Skin: Negative; No rashes or skin lesions Psychiatric: Negative; No behavioral problems, depression Sleep: Negative; No snoring, daytime sleepiness, hypersomnolence, bruxism, restless legs, hypnogognic hallucinations, no cataplexy Other comprehensive 14 point system review is negative.   PE BP 158/80 mmHg  Pulse 75  Ht '5\' 1"'  (1.549 m)  Wt 143 lb 6.4 oz (65.046 kg)  BMI 27.11 kg/m2  Wt Readings from Last 3 Encounters:  01/16/15 143 lb 6.4 oz (65.046 kg)  05/12/14 138 lb 4.8 oz (62.732 kg)  01/14/14 135 lb (61.236 kg)   General: Alert, oriented, no distress.  Skin: normal turgor, no  rashes HEENT: Normocephalic, atraumatic. Pupils round and reactive; sclera anicteric;no lid lag.  Nose without nasal septal hypertrophy Mouth/Parynx benign; Mallinpatti scale 2 Neck: No JVD, no carotid bruits; normal carotid upstroke Lungs: clear to ausculatation and percussion; no wheezing or rales Chest wall: no tenderness to palpitation Heart: RRR, s1 s2 normal 1/6 systolic murmur; no diastolic murmur.  No S3, S4 gallop.  No rubs thrills or heaves. Abdomen: soft, nontender; no hepatosplenomehaly, BS+; abdominal aorta nontender and not dilated by palpation. Back: no CVA tenderness Pulses 2+ Extremities:  Ankle edema, left greater than right raced 1+Homan's sign negative  Neurologic: grossly nonfocal Psychologic: normal affect and mood.  ECG (independently read by me):  Normal sinus rhythm at 75 bpm.  Left bundle branch block with repolarization changes.  First-degree AV block.  January 2016ECG (independently read by me):  Normal sinus rhythm at 63 bpm.  Left bundle branch block with repolarization changes.  QTc interval 454 ms.  Prior December 2014 ECG: Sinus rhythm with left bundle branch block and associated  repolarization changes.  LABS: BMP Latest Ref Rng 05/13/2014 01/08/2014 10/04/2013  Glucose 70 - 99 mg/dL 93 118(H) 125(H)  BUN 6 - 23 mg/dL 25(H) 29(H) 38(H)  Creatinine 0.50 - 1.10 mg/dL 1.49(H) 1.81(H) 1.72(H)  Sodium 135 - 145 mEq/L 144 142 144  Potassium 3.5 - 5.3 mEq/L 4.3 5.4(H) 4.3  Chloride 96 - 112 mEq/L 108 104 108  CO2 19 - 32 mEq/L '26 28 26  ' Calcium 8.4 - 10.5 mg/dL 8.8 9.2 9.0   Hepatic Function Latest Ref Rng 05/13/2014 11/08/2010  Total Protein 6.0 - 8.3 g/dL 6.3 6.4  Albumin 3.5 - 5.2 g/dL 3.8 3.4(L)  AST 0 - 37 U/L 23 20  ALT 0 - 35 U/L 20 16  Alk Phosphatase 39 - 117 U/L 69 73  Total Bilirubin 0.2 - 1.2 mg/dL 0.4 0.4   CBC Latest Ref Rng 05/13/2014 01/14/2014 10/04/2013  WBC 4.0 - 10.5 K/uL 4.2 - -  Hemoglobin 12.0 - 15.0 g/dL 11.6(L) 11.0(L) 10.6(L)  Hematocrit 36.0 - 46.0 % 35.5(L) - 32.5(L)  Platelets 150 - 400 K/uL 174 - -   Lab Results  Component Value Date   MCV 103.8* 05/13/2014   MCV 103.9* 02/21/2013   MCV 102.0* 12/20/2012   Lab Results  Component Value Date   TSH 2.162 05/13/2014  No results found for: HGBA1C   Lipid Panel     Component Value Date/Time   CHOL 164 05/13/2014 0846   TRIG 118 05/13/2014 0846   HDL 46 05/13/2014 0846   CHOLHDL 3.6 05/13/2014 0846   VLDL 24 05/13/2014 0846   LDLCALC 94 05/13/2014 0846   RADIOLOGY: No results found.    ASSESSMENT AND PLAN: Ms. Germani is a 81 year old female who underwent complex but successful high-speed rotational atherectomy of a severely diffusely diseased proximal to mid LAD in July 2012.  She continues to note significant benefit since that procedure.  She is now 4 years later.  She continues to do fairly well without significant recurrent anginal symptomatology.  She continues to have difficulty with blood pressure control.  She apparently has not been taking her carvedilol twice a day and taking 12.5 mg daily. She has left bundle branch block and first-degree AV block. I am adding HCTZ 12.5 mg  every other day to her medical regimen, which will be helpful for both blood pressure control as well as her intermittent ankle swelling. She continues to be on isosorbide and amlodipine. In the past  she had been taking 7.5 mg of amlodipine, but is only been taking 5 mg presently. She tells me she had complete blood work done last month.  Prior primary physician.  I will try to obtain these results. Her last nuclear perfusion study was in January 2013.  I am scheduling her for an echo Doppler study to follow-up per study from 5 years ago.  She will also undergo a [place] study to further evaluate her skin.  The cart disease tickly with her complex anatomy.  I will see her in 6 months for reevaluation or sooner as needed.   Troy Sine, MD, [facility]  01/17/2015 2:39 PM

## 2015-01-19 ENCOUNTER — Other Ambulatory Visit: Payer: Self-pay | Admitting: Cardiovascular Disease

## 2015-01-19 DIAGNOSIS — I2510 Atherosclerotic heart disease of native coronary artery without angina pectoris: Secondary | ICD-10-CM

## 2015-01-20 ENCOUNTER — Other Ambulatory Visit: Payer: Self-pay | Admitting: Physician Assistant

## 2015-01-20 DIAGNOSIS — I25810 Atherosclerosis of coronary artery bypass graft(s) without angina pectoris: Secondary | ICD-10-CM

## 2015-01-20 DIAGNOSIS — I10 Essential (primary) hypertension: Secondary | ICD-10-CM

## 2015-01-20 DIAGNOSIS — I2510 Atherosclerotic heart disease of native coronary artery without angina pectoris: Secondary | ICD-10-CM

## 2015-01-21 ENCOUNTER — Ambulatory Visit (HOSPITAL_COMMUNITY): Admission: RE | Admit: 2015-01-21 | Payer: Medicare Other | Source: Ambulatory Visit

## 2015-01-21 ENCOUNTER — Encounter (HOSPITAL_COMMUNITY)
Admission: RE | Admit: 2015-01-21 | Discharge: 2015-01-21 | Disposition: A | Discharge status: Home / Self Care | Payer: Medicare Other | Source: Ambulatory Visit | Attending: Cardiovascular Disease | Admitting: Cardiovascular Disease

## 2015-01-21 ENCOUNTER — Encounter (HOSPITAL_COMMUNITY): Payer: Self-pay

## 2015-01-21 ENCOUNTER — Ambulatory Visit (HOSPITAL_COMMUNITY)
Admission: RE | Admit: 2015-01-21 | Discharge: 2015-01-21 | Disposition: A | Discharge status: Home / Self Care | Payer: Medicare Other | Source: Ambulatory Visit | Attending: Cardiovascular Disease | Admitting: Cardiovascular Disease

## 2015-01-21 DIAGNOSIS — I2510 Atherosclerotic heart disease of native coronary artery without angina pectoris: Secondary | ICD-10-CM | POA: Insufficient documentation

## 2015-01-21 DIAGNOSIS — I25810 Atherosclerosis of coronary artery bypass graft(s) without angina pectoris: Secondary | ICD-10-CM

## 2015-01-21 DIAGNOSIS — I10 Essential (primary) hypertension: Secondary | ICD-10-CM | POA: Insufficient documentation

## 2015-01-21 LAB — NM MYOCAR MULTI W/SPECT W/WALL MOTION / EF
CSEPPHR: 104 {beats}/min
LV dias vol: 74 mL
LVSYSVOL: 34 mL
RATE: 0.04
Rest HR: 61 {beats}/min
SDS: 1
SRS: 5
SSS: 6
TID: 1.02

## 2015-01-21 MED ORDER — REGADENOSON 0.4 MG/5ML IV SOLN
INTRAVENOUS | Status: AC
  Administered 2015-01-21: 0.4 mg via INTRAVENOUS
  Filled 2015-01-21: qty 5

## 2015-01-21 MED ORDER — TECHNETIUM TC 99M SESTAMIBI - CARDIOLITE
10.0000 | Freq: Once | INTRAVENOUS | Status: AC | PRN
  Administered 2015-01-21: 08:00:00 9 via INTRAVENOUS

## 2015-01-21 MED ORDER — SODIUM CHLORIDE 0.9 % IJ SOLN
INTRAMUSCULAR | Status: AC
  Administered 2015-01-21: 10 mL via INTRAVENOUS
  Filled 2015-01-21: qty 3

## 2015-01-21 MED ORDER — TECHNETIUM TC 99M SESTAMIBI GENERIC - CARDIOLITE
30.0000 | Freq: Once | INTRAVENOUS | Status: AC | PRN
  Administered 2015-01-21: 30 via INTRAVENOUS

## 2015-01-26 ENCOUNTER — Telehealth (HOSPITAL_COMMUNITY): Payer: Self-pay | Admitting: *Deleted

## 2015-01-26 ENCOUNTER — Encounter: Payer: Self-pay | Admitting: *Deleted

## 2015-02-02 DIAGNOSIS — N390 Urinary tract infection, site not specified: Secondary | ICD-10-CM | POA: Diagnosis not present

## 2015-02-02 DIAGNOSIS — N3000 Acute cystitis without hematuria: Secondary | ICD-10-CM | POA: Diagnosis not present

## 2015-02-17 DIAGNOSIS — L570 Actinic keratosis: Secondary | ICD-10-CM | POA: Diagnosis not present

## 2015-02-17 DIAGNOSIS — D225 Melanocytic nevi of trunk: Secondary | ICD-10-CM | POA: Diagnosis not present

## 2015-02-17 DIAGNOSIS — B078 Other viral warts: Secondary | ICD-10-CM | POA: Diagnosis not present

## 2015-02-17 DIAGNOSIS — X32XXXD Exposure to sunlight, subsequent encounter: Secondary | ICD-10-CM | POA: Diagnosis not present

## 2015-03-31 ENCOUNTER — Other Ambulatory Visit: Payer: Self-pay | Admitting: Cardiovascular Disease

## 2015-03-31 NOTE — Telephone Encounter (Signed)
°*  STAT* If patient is at the pharmacy, call can be transferred to refill team.   1. Which medications need to be refilled? (please list name of each medication and dose if known) Amlodipine 5mg    2. Which pharmacy/location (including street and city if local pharmacy) is medication to be sent to?[facility] in [place]   3. Do they need a 30 day or 90 day supply? [place]

## 2015-04-01 NOTE — Telephone Encounter (Signed)
[name] is calling to see if her medication has been refilled . Stating that she will be out of the medication , she has two pills left . Thanks

## 2015-09-18 DIAGNOSIS — Z1389 Encounter for screening for other disorder: Secondary | ICD-10-CM | POA: Diagnosis not present

## 2015-09-18 DIAGNOSIS — M25551 Pain in right hip: Secondary | ICD-10-CM | POA: Diagnosis not present

## 2015-10-12 ENCOUNTER — Other Ambulatory Visit (HOSPITAL_COMMUNITY): Payer: Self-pay | Admitting: Family Medicine

## 2015-10-12 ENCOUNTER — Other Ambulatory Visit: Payer: Self-pay | Admitting: Cardiovascular Disease

## 2015-10-12 DIAGNOSIS — Z1231 Encounter for screening mammogram for malignant neoplasm of breast: Secondary | ICD-10-CM

## 2015-10-12 NOTE — Telephone Encounter (Signed)
Rx(s) sent to pharmacy electronically.  

## 2015-10-28 ENCOUNTER — Ambulatory Visit (HOSPITAL_COMMUNITY)
Admission: RE | Admit: 2015-10-28 | Discharge: 2015-10-28 | Disposition: A | Discharge status: Home / Self Care | Payer: Medicare Other | Source: Ambulatory Visit | Attending: Family Medicine | Admitting: Family Medicine

## 2015-10-28 DIAGNOSIS — Z1231 Encounter for screening mammogram for malignant neoplasm of breast: Secondary | ICD-10-CM | POA: Insufficient documentation

## 2015-11-05 DIAGNOSIS — W57XXXA Bitten or stung by nonvenomous insect and other nonvenomous arthropods, initial encounter: Secondary | ICD-10-CM | POA: Diagnosis not present

## 2015-11-05 DIAGNOSIS — S1095XA Superficial foreign body of unspecified part of neck, initial encounter: Secondary | ICD-10-CM | POA: Diagnosis not present

## 2015-12-21 ENCOUNTER — Telehealth: Payer: Self-pay | Admitting: Cardiovascular Disease

## 2015-12-21 NOTE — Telephone Encounter (Signed)
New message       Calling to see if the life-line screening is the same as the screen pt has at the hosp.  She said the cost is doubled at the hosp.  Will Dr Claiborne Billings accept the results?

## 2015-12-21 NOTE — Telephone Encounter (Signed)
Patient called because she is aware she may be recommended to undergo repeat stress test at some point in the future. She is asking me if it would be advised/beneficial for her to use a service called [facility]. This is a mobile service that does a variety of primary prevention and cardiovascular screenings. Per our discussion and review of website information, this is for early detection of cardiovascular disease and not for established disease. I stated to the patient that I am not sure if this would be beneficial or not, and would have to inquire to physician on appropriateness. She voiced understanding.

## 2015-12-29 DIAGNOSIS — Z1389 Encounter for screening for other disorder: Secondary | ICD-10-CM | POA: Diagnosis not present

## 2015-12-29 DIAGNOSIS — R350 Frequency of micturition: Secondary | ICD-10-CM | POA: Diagnosis not present

## 2015-12-29 DIAGNOSIS — M5431 Sciatica, right side: Secondary | ICD-10-CM | POA: Diagnosis not present

## 2015-12-29 DIAGNOSIS — N184 Chronic kidney disease, stage 4 (severe): Secondary | ICD-10-CM | POA: Diagnosis not present

## 2016-02-11 ENCOUNTER — Other Ambulatory Visit: Payer: Self-pay | Admitting: Cardiovascular Disease

## 2016-02-26 DIAGNOSIS — I10 Essential (primary) hypertension: Secondary | ICD-10-CM | POA: Diagnosis not present

## 2016-02-26 DIAGNOSIS — K219 Gastro-esophageal reflux disease without esophagitis: Secondary | ICD-10-CM | POA: Diagnosis not present

## 2016-02-26 DIAGNOSIS — M1991 Primary osteoarthritis, unspecified site: Secondary | ICD-10-CM | POA: Diagnosis not present

## 2016-02-26 DIAGNOSIS — I158 Other secondary hypertension: Secondary | ICD-10-CM | POA: Diagnosis not present

## 2016-02-26 DIAGNOSIS — Z1389 Encounter for screening for other disorder: Secondary | ICD-10-CM | POA: Diagnosis not present

## 2016-02-28 NOTE — Telephone Encounter (Signed)
[facility] does not do stress tests

## 2016-03-02 ENCOUNTER — Telehealth: Payer: Self-pay | Admitting: Cardiovascular Disease

## 2016-03-02 NOTE — Telephone Encounter (Signed)
Pt informs me her BP readings had been trending higher, Dr. Alesia Morin saw her recently and upped her prescription of Amlodipine to 10mg  -- called this to pharmacy. She mistakenly picked up an amlodipine prescription for 5mg  daily that she had previously called a refill for. Didn't think she needed both, so only picked the 5mg  strength up. I advised that if Dr. Alesia Morin recommended increase based on her recent BP readings, to take that dose.   I offered appt for patient as she is over a year out for f/u appt w [name]. She cites various barriers at this time including insurance and ltc of spouse.  Advised to call when she is ready to set up return appt w [name]. Pt aware she should defer to Dr. Alesia Morin for refills on the amlodipine for now.

## 2016-03-02 NOTE — Telephone Encounter (Signed)
New message   Pt verbalized that Dr. Alesia Morin wants Dr.Kelly to change the prescription amount Amlodipine from 5mg  to 10mg 

## 2016-03-28 ENCOUNTER — Ambulatory Visit: Payer: Medicare Other | Admitting: Orthopedic Surgery

## 2016-04-11 ENCOUNTER — Telehealth: Payer: Self-pay | Admitting: Cardiovascular Disease

## 2016-04-11 NOTE — Telephone Encounter (Signed)
Called back and confirmed w [employer] rep Selinda Flavin that he is asking for a coding authorization for [name]. This pertains to information requiring an MD signature. I gave him our [phone] fax as initial packets had been sent to [phone]. He voiced acknowledgment, stated he would need a faxed sheet sent w fax # on our letterhead to confirm information would be sent correctly. I have sent this to fax number [phone] provided by [facility] rep.

## 2016-04-11 NOTE — Telephone Encounter (Signed)
Jun is calling from [facility] is calling to check the status of a coding [place] [id number]

## 2016-04-14 NOTE — Telephone Encounter (Signed)
[place]-[employer] calling to see if we received the fax that they sent on Dec 4 @1 :38pm. [phone]

## 2016-04-26 ENCOUNTER — Telehealth: Payer: Self-pay | Admitting: Cardiovascular Disease

## 2016-04-26 NOTE — Telephone Encounter (Signed)
Requested these forms no longer be sent to Dr Claiborne Billings

## 2016-04-26 NOTE — Telephone Encounter (Signed)
[name] verbalized understanding

## 2016-04-26 NOTE — Telephone Encounter (Signed)
[name] from [facility] is calling to check the status of a provider Query fax on Dec 6 ref:[phone]

## 2016-04-27 ENCOUNTER — Other Ambulatory Visit: Payer: Self-pay | Admitting: Cardiovascular Disease

## 2016-04-28 DIAGNOSIS — D225 Melanocytic nevi of trunk: Secondary | ICD-10-CM | POA: Diagnosis not present

## 2016-04-28 DIAGNOSIS — L820 Inflamed seborrheic keratosis: Secondary | ICD-10-CM | POA: Diagnosis not present

## 2016-05-16 ENCOUNTER — Telehealth: Payer: Self-pay | Admitting: Cardiovascular Disease

## 2016-05-16 NOTE — Telephone Encounter (Signed)
New Message   Margreta Journey from [facility] call requesting to speak with RN f/u on faxed  Information. Please call back to confirm if information has been received.

## 2016-05-17 NOTE — Telephone Encounter (Signed)
I have no clue what she is referring to. I have gone through all of Dr Evette Georges correspondence and have not seen anything for this patient. Will await a fax from them.

## 2016-05-17 NOTE — Telephone Encounter (Signed)
Called [employer] and spoke with "Jonah" to see if they can refax whatever they are looking for. She informed me to disregard the message.

## 2016-05-23 DIAGNOSIS — Z1389 Encounter for screening for other disorder: Secondary | ICD-10-CM | POA: Diagnosis not present

## 2016-05-23 DIAGNOSIS — I2510 Atherosclerotic heart disease of native coronary artery without angina pectoris: Secondary | ICD-10-CM | POA: Diagnosis not present

## 2016-05-23 DIAGNOSIS — I10 Essential (primary) hypertension: Secondary | ICD-10-CM | POA: Diagnosis not present

## 2016-05-23 DIAGNOSIS — R600 Localized edema: Secondary | ICD-10-CM | POA: Diagnosis not present

## 2016-06-13 DIAGNOSIS — N183 Chronic kidney disease, stage 3 (moderate): Secondary | ICD-10-CM | POA: Diagnosis not present

## 2016-06-13 DIAGNOSIS — I2510 Atherosclerotic heart disease of native coronary artery without angina pectoris: Secondary | ICD-10-CM | POA: Diagnosis not present

## 2016-06-13 DIAGNOSIS — I10 Essential (primary) hypertension: Secondary | ICD-10-CM | POA: Diagnosis not present

## 2016-06-20 ENCOUNTER — Other Ambulatory Visit: Payer: Self-pay | Admitting: Cardiovascular Disease

## 2016-07-04 DIAGNOSIS — L570 Actinic keratosis: Secondary | ICD-10-CM | POA: Diagnosis not present

## 2016-07-04 DIAGNOSIS — X32XXXD Exposure to sunlight, subsequent encounter: Secondary | ICD-10-CM | POA: Diagnosis not present

## 2016-07-04 DIAGNOSIS — I872 Venous insufficiency (chronic) (peripheral): Secondary | ICD-10-CM | POA: Diagnosis not present

## 2016-07-16 IMAGING — MG MM DIGITAL SCREENING
4 series · 4 of 4 positions shown · non-contrast
Comparison: Previous exam(s).

CLINICAL DATA: Screening.

EXAM:
DIGITAL SCREENING BILATERAL MAMMOGRAM WITH CAD

[L CC]
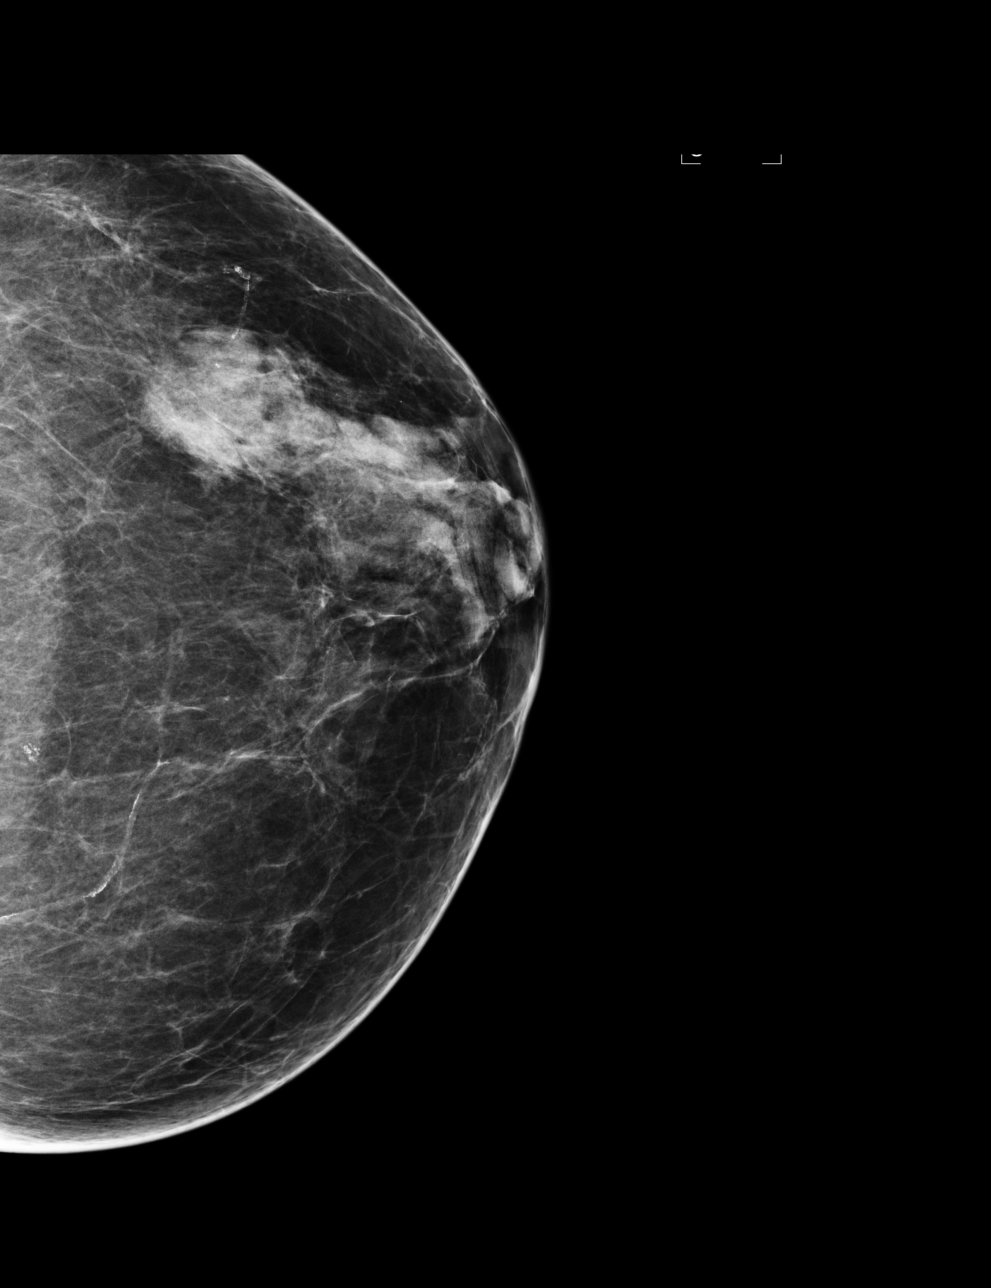

[L MLO]
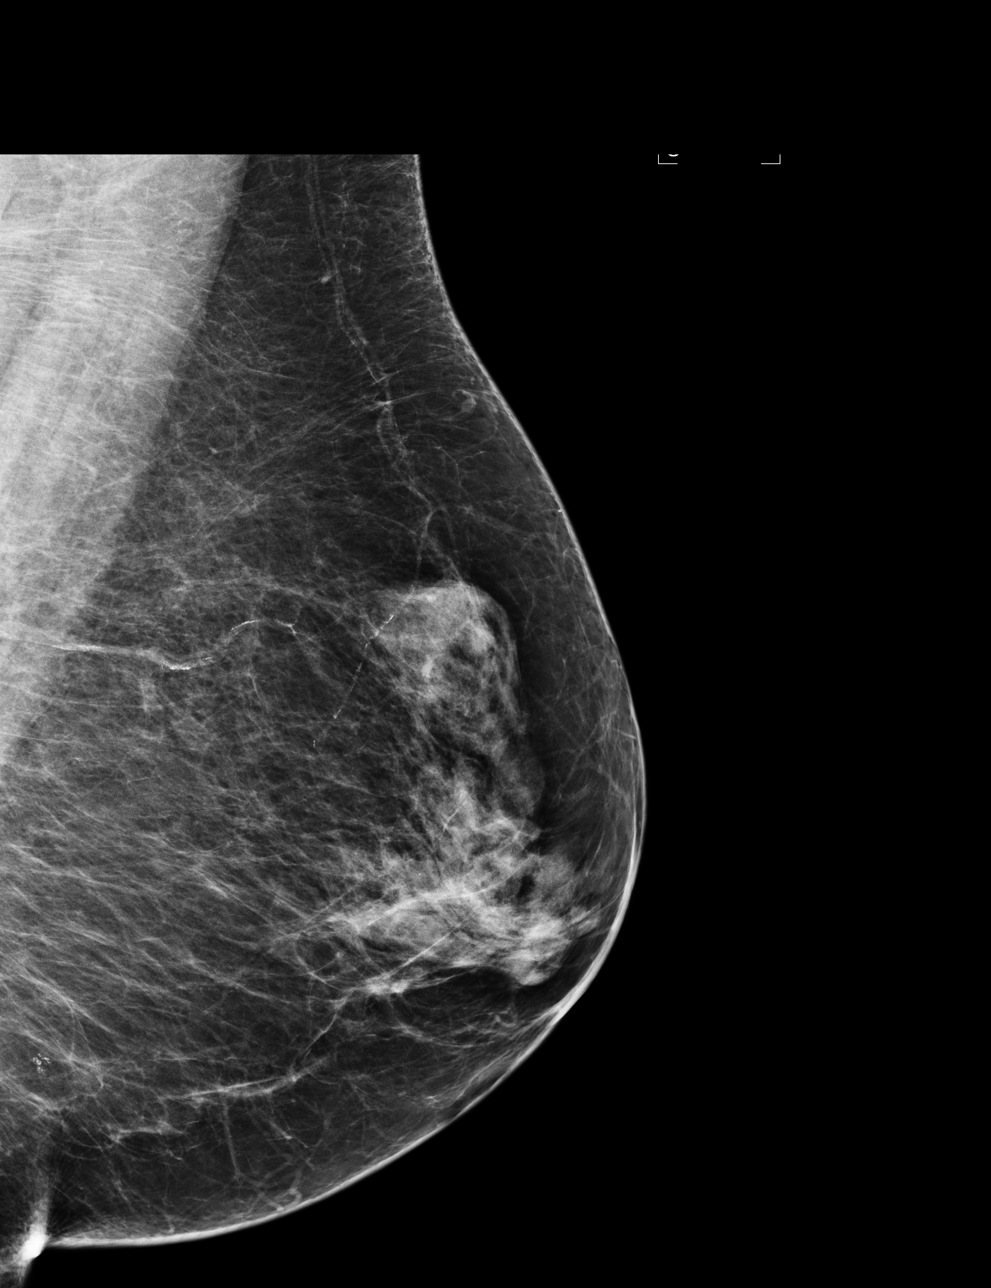

[R CC]
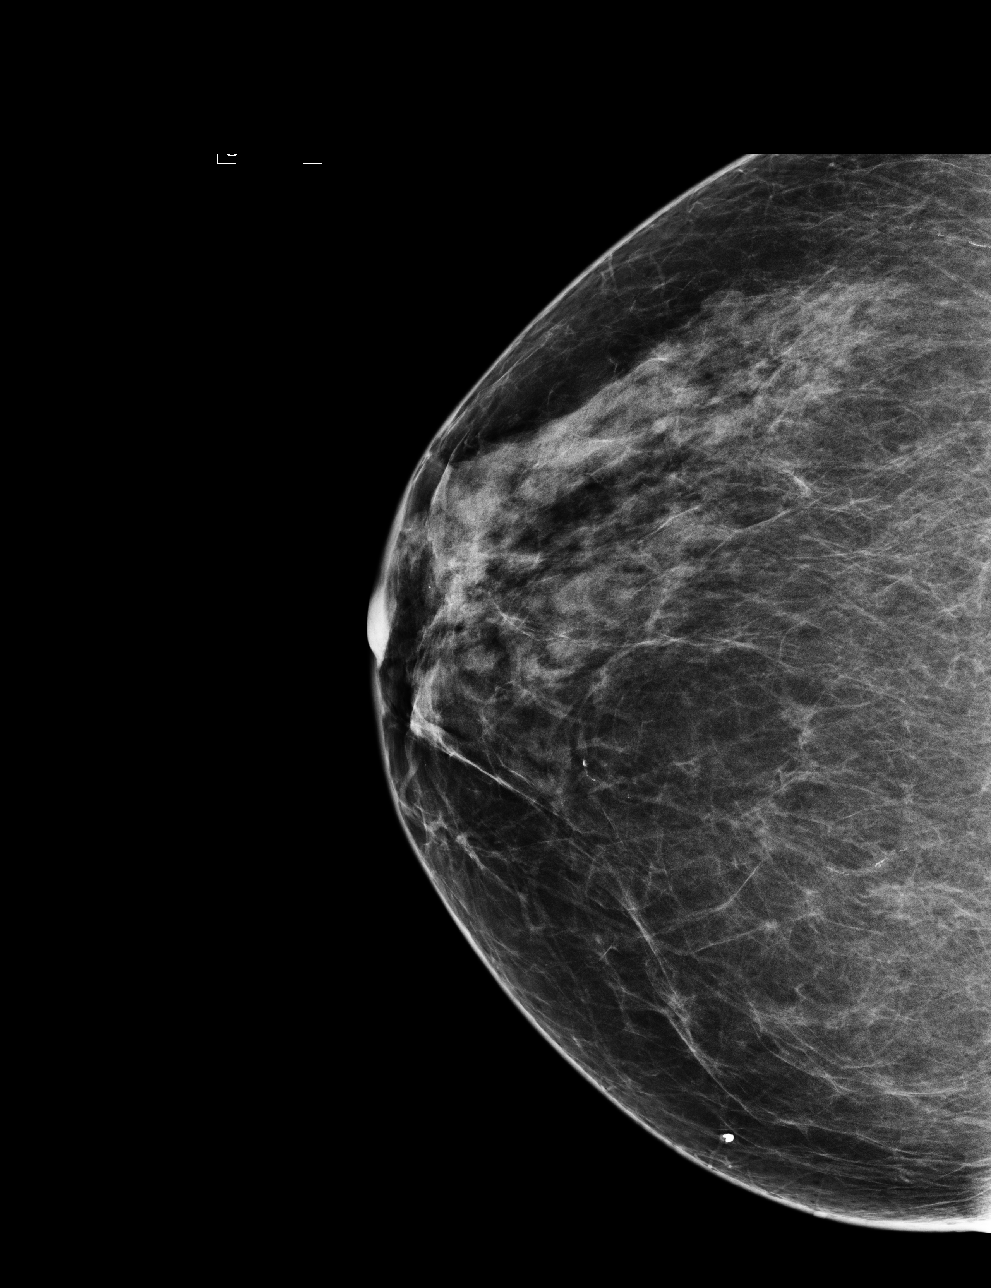

[R MLO]
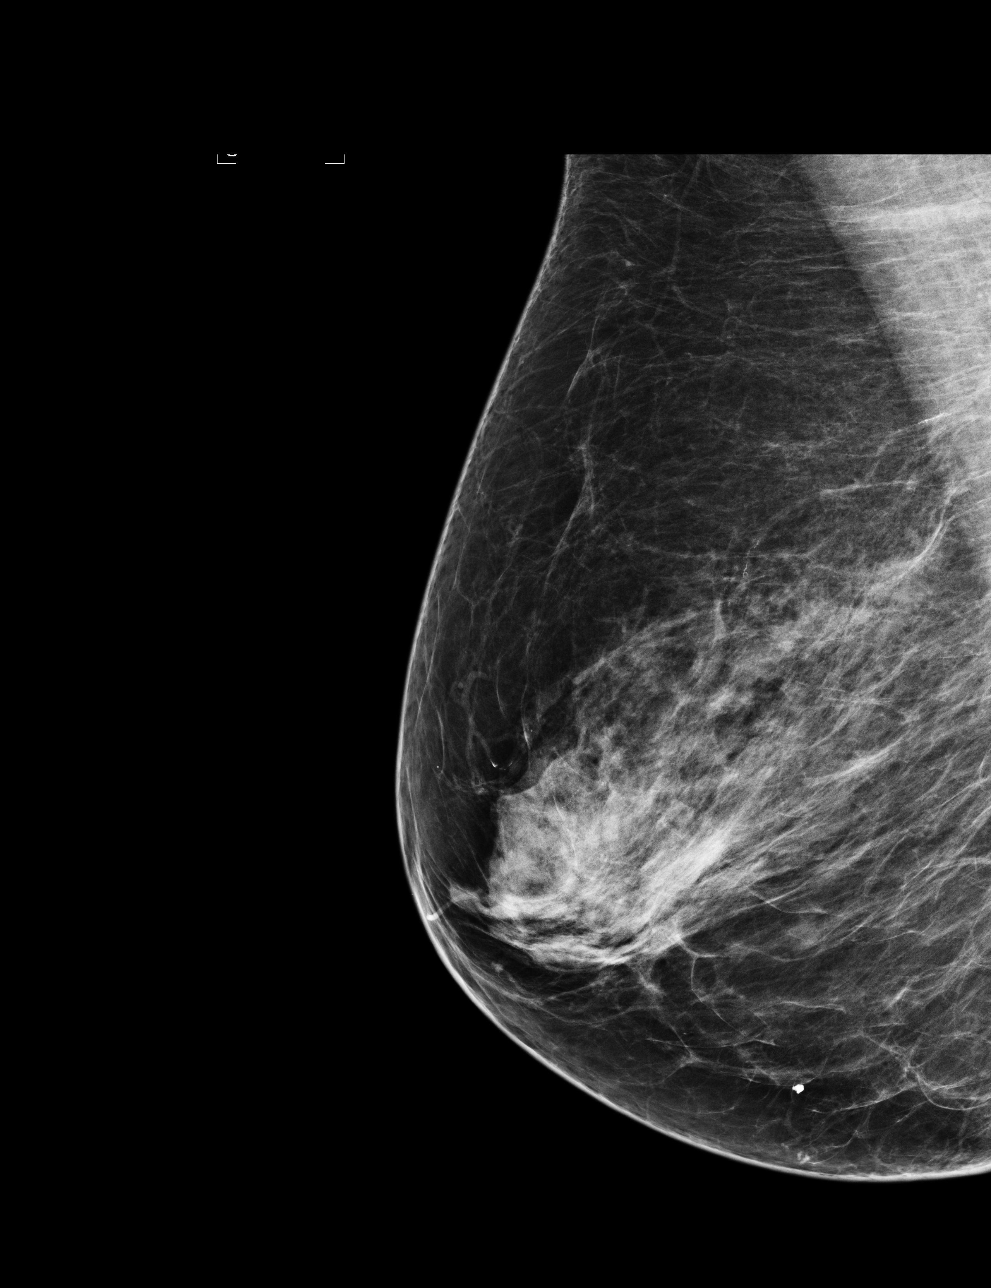

[4 of 4 positions shown; findings below may reference images not displayed]

ACR Breast Density Category c: The breast tissue is heterogeneously
dense, which may obscure small masses.
FINDINGS: There are no findings suspicious for malignancy. Images were
processed with CAD.
IMPRESSION: No mammographic evidence of malignancy. A result letter of this
screening mammogram will be mailed directly to the patient.

RECOMMENDATION:
Screening mammogram in one year. (Code:YJ-2-FEZ)

BI-RADS CATEGORY  1: Negative.

## 2016-08-24 ENCOUNTER — Ambulatory Visit: Payer: Medicare Other | Admitting: Urology

## 2016-10-17 ENCOUNTER — Other Ambulatory Visit: Payer: Self-pay | Admitting: Cardiovascular Disease

## 2016-10-17 DIAGNOSIS — N183 Chronic kidney disease, stage 3 (moderate): Secondary | ICD-10-CM | POA: Diagnosis not present

## 2016-10-17 NOTE — Telephone Encounter (Signed)
Rx(s) sent to pharmacy electronically.  

## 2016-10-28 DIAGNOSIS — I2510 Atherosclerotic heart disease of native coronary artery without angina pectoris: Secondary | ICD-10-CM | POA: Diagnosis not present

## 2016-10-28 DIAGNOSIS — I10 Essential (primary) hypertension: Secondary | ICD-10-CM | POA: Diagnosis not present

## 2016-10-28 DIAGNOSIS — N183 Chronic kidney disease, stage 3 (moderate): Secondary | ICD-10-CM | POA: Diagnosis not present

## 2016-10-31 DIAGNOSIS — N184 Chronic kidney disease, stage 4 (severe): Secondary | ICD-10-CM | POA: Diagnosis not present

## 2016-10-31 DIAGNOSIS — N3000 Acute cystitis without hematuria: Secondary | ICD-10-CM | POA: Diagnosis not present

## 2016-10-31 DIAGNOSIS — Z1389 Encounter for screening for other disorder: Secondary | ICD-10-CM | POA: Diagnosis not present

## 2016-11-02 ENCOUNTER — Other Ambulatory Visit (HOSPITAL_COMMUNITY): Payer: Self-pay | Admitting: Internal Medicine

## 2016-11-02 DIAGNOSIS — Z1231 Encounter for screening mammogram for malignant neoplasm of breast: Secondary | ICD-10-CM

## 2016-11-02 DIAGNOSIS — M79675 Pain in left toe(s): Secondary | ICD-10-CM | POA: Diagnosis not present

## 2016-11-02 DIAGNOSIS — L03032 Cellulitis of left toe: Secondary | ICD-10-CM | POA: Diagnosis not present

## 2016-11-02 DIAGNOSIS — M79672 Pain in left foot: Secondary | ICD-10-CM | POA: Diagnosis not present

## 2016-11-02 DIAGNOSIS — L600 Ingrowing nail: Secondary | ICD-10-CM | POA: Diagnosis not present

## 2016-11-11 ENCOUNTER — Ambulatory Visit (INDEPENDENT_AMBULATORY_CARE_PROVIDER_SITE_OTHER): Payer: Medicare Other | Admitting: Urology

## 2016-11-11 ENCOUNTER — Other Ambulatory Visit (HOSPITAL_COMMUNITY)
Admission: RE | Admit: 2016-11-11 | Discharge: 2016-11-11 | Disposition: A | Discharge status: Home / Self Care | Payer: Medicare Other | Source: Ambulatory Visit | Attending: Urology | Admitting: Urology

## 2016-11-11 DIAGNOSIS — N952 Postmenopausal atrophic vaginitis: Secondary | ICD-10-CM | POA: Diagnosis not present

## 2016-11-11 DIAGNOSIS — N3946 Mixed incontinence: Secondary | ICD-10-CM | POA: Diagnosis not present

## 2016-11-11 DIAGNOSIS — N8111 Cystocele, midline: Secondary | ICD-10-CM | POA: Diagnosis not present

## 2016-11-11 DIAGNOSIS — N3642 Intrinsic sphincter deficiency (ISD): Secondary | ICD-10-CM

## 2016-11-11 LAB — URINALYSIS, ROUTINE W REFLEX MICROSCOPIC
Bilirubin Urine: NEGATIVE
GLUCOSE, UA: NEGATIVE mg/dL
Hgb urine dipstick: NEGATIVE
KETONES UR: NEGATIVE mg/dL
Nitrite: NEGATIVE
PROTEIN: NEGATIVE mg/dL
Specific Gravity, Urine: 1.02 (ref 1.005–1.030)
pH: 5.5 (ref 5.0–8.0)

## 2016-11-11 LAB — URINALYSIS, MICROSCOPIC (REFLEX): RBC / HPF: NONE SEEN RBC/hpf (ref 0–5)

## 2016-11-12 LAB — URINE CULTURE

## 2016-11-14 ENCOUNTER — Ambulatory Visit (HOSPITAL_COMMUNITY): Payer: Medicare Other

## 2016-11-14 ENCOUNTER — Encounter (HOSPITAL_COMMUNITY): Payer: Self-pay

## 2016-11-21 ENCOUNTER — Ambulatory Visit (HOSPITAL_COMMUNITY)
Admission: RE | Admit: 2016-11-21 | Discharge: 2016-11-21 | Disposition: A | Discharge status: Home / Self Care | Payer: Medicare Other | Source: Ambulatory Visit | Attending: Internal Medicine | Admitting: Internal Medicine

## 2016-11-21 DIAGNOSIS — Z1231 Encounter for screening mammogram for malignant neoplasm of breast: Secondary | ICD-10-CM | POA: Diagnosis not present

## 2016-11-28 ENCOUNTER — Encounter: Payer: Self-pay | Admitting: Obstetrics and Gynecology

## 2016-11-28 ENCOUNTER — Ambulatory Visit (INDEPENDENT_AMBULATORY_CARE_PROVIDER_SITE_OTHER): Payer: Medicare Other | Admitting: Obstetrics and Gynecology

## 2016-11-28 VITALS — BP 148/86 | HR 68 | Wt 160.2 lb

## 2016-11-28 DIAGNOSIS — R1900 Intra-abdominal and pelvic swelling, mass and lump, unspecified site: Secondary | ICD-10-CM | POA: Diagnosis not present

## 2016-11-28 DIAGNOSIS — R1909 Other intra-abdominal and pelvic swelling, mass and lump: Secondary | ICD-10-CM | POA: Diagnosis not present

## 2016-11-28 NOTE — Progress Notes (Signed)
[facility] Visit  11/28/2016            Patient name: [name] MRN [id number]  Date of birth: 11/20/1933  CC & HPI:  [name] is a 83 y.o. female presenting today for moderate pain across her lower pelvic area for the last 2 months. Pt describes pain as a raw spot, and pressure on her bowels and bladder. No alleviating factors noted. She has associated symptoms of dyspareunia, and pain in the area when having a BM. Pt  Is also concerned about her food moving very fast through her system, and occasionally has a BM soon after she eats. Pt has had gradual weight gain over the past few year of 30 lbs.   ROS:  ROS +dyspareunia +lower pelvic pain +pressure against bladder and bowels  Pertinent History Reviewed:   Reviewed: Significant for parital hysterectomy Medical         Past Medical History:  Diagnosis Date  . Anemia   . Anxiety   . Arthritis   . Bleeding from the nose Oct 2014  . CAD (coronary artery disease)   . Cataract of both eyes   . Chronic kidney disease   . Colon polyps   . Hyperlipidemia   . Hypertension    x 5-6 yrs.  Marland Kitchen LBBB (left bundle branch block)                               Surgical Hx:    Past Surgical History:  Procedure Laterality Date  . BREAST SURGERY     masses removed while she was pregnant  . CARDIAC CATHETERIZATION  06/20/2008   severe single vessel high-grade stenosis of mid LAD at bifurcation of diagonal 1 and setpal perforator 1; diffuse coronary calcification of L coronary system; 50% stenosis of mid RCA (Dr. Jackie Plum)  . CATARACT EXTRACTION W/PHACO Right 10/10/2013   Procedure: CATARACT EXTRACTION PHACO AND INTRAOCULAR LENS PLACEMENT (IOC);  Surgeon: Tonny Branch, MD;  [facility]: [facility];  Service: Ophthalmology;  Laterality: Right;  CDE:11.96  . CATARACT EXTRACTION W/PHACO Left 11/18/2013   Procedure: CATARACT EXTRACTION PHACO AND INTRAOCULAR LENS PLACEMENT (IOC);  Surgeon: Tonny Branch, MD;  Location: [facility];  Service:  Ophthalmology;  Laterality: Left;  CDE 15.96  . COLONOSCOPY N/A 01/16/2013   Procedure: COLONOSCOPY;  Surgeon: Rogene Houston, MD;  [facility];  Service: Endoscopy;  Laterality: N/A;  1200  . CORONARY ANGIOPLASTY WITH STENT PLACEMENT  11/08/2010   complex high-speed rotational atherectomy of calcified LAD, diffusely disease LAD system (Dr. Corky Downs)  . EXCISION ORAL TUMOR Right 01/14/2014   Procedure: EXCISION OROPHARYNGEAL MASS;  Surgeon: Ascencion Dike, MD;  Location: [place];  Service: ENT;  Laterality: Right;  . NM MYOCAR PERF WALL MOTION  05/2011   lexiscan myoview; normal pattern of perfusion in all regions; post-stress EF 55%; low risk scan   . PARTIAL HYSTERECTOMY    . TONSILLECTOMY    . TRANSTHORACIC ECHOCARDIOGRAM  10/2010   EF 45-50%, mod conc LVH, grade 1 diastolic dysfunction, mildly calcified left AV cusp; calcified MV annulus   Medications: Reviewed & Updated - see associated section                       Current Outpatient Prescriptions:  .  ALPRAZolam (XANAX) 0.5 MG tablet, Take 0.25 mg by mouth at bedtime as  needed for sleep. , Disp: , Rfl:  .  amLODipine (NORVASC) 5 MG tablet, Take 5 mg by mouth daily., Disp: , Rfl:  .  amLODipine (NORVASC) 5 MG tablet, Take 1 tablet (5 mg total) by mouth daily., Disp: 30 tablet, Rfl: 10 .  aspirin 81 MG tablet, Take 81 mg by mouth daily. , Disp: , Rfl:  .  carvedilol (COREG) 25 MG tablet, Take 1 tablet (25 mg total) by mouth 2 (two) times daily with a meal. <PLEASE MAKE APPOINTMENT FOR REFILLS>, Disp: 30 tablet, Rfl: 0 .  cholecalciferol (VITAMIN D) 1000 UNITS tablet, Take 1,000 Units by mouth daily., Disp: , Rfl:  .  FLAXSEED, LINSEED, PO, Take 1 capsule by mouth daily., Disp: , Rfl:  .  hydrochlorothiazide (MICROZIDE) 12.5 MG capsule, TAKE ONE CAPSULE BY MOUTH EVERY OTHER DAY, Disp: 15 capsule, Rfl: 0 .  hydrocortisone (ANUSOL-HC) 25 MG suppository, Place 1 suppository (25 mg total) rectally at bedtime., Disp: 14  suppository, Rfl: 1 .  isosorbide mononitrate (IMDUR) 120 MG 24 hr tablet, TAKE ONE TABLET BY MOUTH ONCE DAILY AS DIRECTED, Disp: 30 tablet, Rfl: 3 .  ketoconazole (NIZORAL) 2 % cream, Apply 1 application topically daily., Disp: , Rfl:  .  Multiple Vitamin (MULTIVITAMIN) tablet, Take 1 tablet by mouth daily., Disp: , Rfl:  .  vitamin B-12 (CYANOCOBALAMIN) 250 MCG tablet, Take 2,500 mcg by mouth daily., Disp: , Rfl:    Social History: Reviewed -  reports that she has never smoked. She has never used smokeless tobacco.  Objective Findings:  Vitals: There were no vitals taken for this visit.  Physical Examination: General appearance - alert, well appearing, and in no distress Mental status - alert, oriented to person, place, and time Pelvic - VULVA: normal appearing vulva with no masses, tenderness or lesions,  VAGINA: normal appearing vagina with normal color and discharge, no lesions, behind vaginal apex is a hardness, tender firmness, moderate cystocele UTERUS: not present ADNEXA: normal adnexa in size, nontender and no masses,  RECTAL: normal rectal, no masses, guaiac negative stool obtained   Assessment & Plan:   A:  1. Central pelvic mass causing left sided pain, moderate cystocele   P:  1.Ttransvaginal U/S on thursday, CA125    By signing my name below, I, [name], attest that this documentation has been prepared under the direction and in the presence of Jonnie Kind, MD. Electronically Signed: Jabier Gauss, ED Scribe. 11/28/16. 1:05 PM.  ..I personally performed the services described in this documentation, which was SCRIBED in my presence. The recorded information has been reviewed and considered accurate. It has been edited as necessary during review. Jonnie Kind, MD

## 2016-11-29 LAB — CA 125: CANCER ANTIGEN (CA) 125: 11.5 U/mL (ref 0.0–38.1)

## 2016-11-30 ENCOUNTER — Other Ambulatory Visit: Payer: Self-pay | Admitting: Obstetrics and Gynecology

## 2016-11-30 DIAGNOSIS — R1900 Intra-abdominal and pelvic swelling, mass and lump, unspecified site: Secondary | ICD-10-CM

## 2016-12-01 ENCOUNTER — Ambulatory Visit (INDEPENDENT_AMBULATORY_CARE_PROVIDER_SITE_OTHER): Payer: Medicare Other

## 2016-12-01 ENCOUNTER — Encounter: Payer: Self-pay | Admitting: Obstetrics and Gynecology

## 2016-12-01 ENCOUNTER — Ambulatory Visit (INDEPENDENT_AMBULATORY_CARE_PROVIDER_SITE_OTHER): Payer: Medicare Other | Admitting: Obstetrics and Gynecology

## 2016-12-01 VITALS — BP 142/74 | HR 70 | Wt 160.8 lb

## 2016-12-01 DIAGNOSIS — R1900 Intra-abdominal and pelvic swelling, mass and lump, unspecified site: Secondary | ICD-10-CM

## 2016-12-01 MED ORDER — POLYETHYLENE GLYCOL 3350 17 GM/SCOOP PO POWD: 1.0000 | Freq: Once | ORAL | Status: DC

## 2016-12-01 NOTE — Progress Notes (Signed)
[facility] Visit  12/01/2016            Patient name: [name] MRN [id number]  Date of birth: 1934-02-28  CC & HPI:  [name] is a 83 y.o. female presenting today for f/u for central pelvic mass causing left sided pain, moderate cystocele, with pelvic U/S done today. No alleviating factors noted  ROS:  ROS -fever -chills  Pertinent History Reviewed:   Reviewed: Significant for partial hysterectomy Medical         Past Medical History:  Diagnosis Date  . Anemia   . Anxiety   . Arthritis   . Bleeding from the nose Oct 2014  . CAD (coronary artery disease)   . Cataract of both eyes   . Chronic kidney disease   . Colon polyps   . Hyperlipidemia   . Hypertension    x 5-6 yrs.  Marland Kitchen LBBB (left bundle branch block)                               Surgical Hx:    Past Surgical History:  Procedure Laterality Date  . BREAST SURGERY     masses removed while she was pregnant  . CARDIAC CATHETERIZATION  06/20/2008   severe single vessel high-grade stenosis of mid LAD at bifurcation of diagonal 1 and setpal perforator 1; diffuse coronary calcification of L coronary system; 50% stenosis of mid RCA (Dr. Jackie Plum)  . CATARACT EXTRACTION W/PHACO Right 10/10/2013   Procedure: CATARACT EXTRACTION PHACO AND INTRAOCULAR LENS PLACEMENT (IOC);  Surgeon: Tonny Branch, MD;  [facility];  Service: Ophthalmology;  Laterality: Right;  CDE:11.96  . CATARACT EXTRACTION W/PHACO Left 11/18/2013   Procedure: CATARACT EXTRACTION PHACO AND INTRAOCULAR LENS PLACEMENT (IOC);  Surgeon: Tonny Branch, MD;  [facility]: [facility];  Service: Ophthalmology;  Laterality: Left;  CDE 15.96  . COLONOSCOPY N/A 01/16/2013   Procedure: COLONOSCOPY;  Surgeon: Rogene Houston, MD;  [facility];  Service: Endoscopy;  Laterality: N/A;  1200  . CORONARY ANGIOPLASTY WITH STENT PLACEMENT  11/08/2010   complex high-speed rotational atherectomy of calcified LAD, diffusely disease LAD system (Dr. Corky Downs)  .  EXCISION ORAL TUMOR Right 01/14/2014   Procedure: EXCISION OROPHARYNGEAL MASS;  Surgeon: Ascencion Dike, MD;  Location: [place];  Service: ENT;  Laterality: Right;  . NM MYOCAR PERF WALL MOTION  05/2011   lexiscan myoview; normal pattern of perfusion in all regions; post-stress EF 55%; low risk scan   . PARTIAL HYSTERECTOMY    . TONSILLECTOMY    . TRANSTHORACIC ECHOCARDIOGRAM  10/2010   EF 45-50%, mod conc LVH, grade 1 diastolic dysfunction, mildly calcified left AV cusp; calcified MV annulus   Medications: Reviewed & Updated - see associated section                       Current Outpatient Prescriptions:  .  ALPRAZolam (XANAX) 0.5 MG tablet, Take 0.25 mg by mouth at bedtime as needed for sleep. , Disp: , Rfl:  .  amLODipine (NORVASC) 5 MG tablet, Take 1 tablet (5 mg total) by mouth daily., Disp: 30 tablet, Rfl: 10 .  aspirin 81 MG tablet, Take 81 mg by mouth daily. , Disp: , Rfl:  .  carvedilol (COREG) 25 MG tablet, Take 1 tablet (25 mg total) by mouth 2 (two) times daily with a meal. <PLEASE MAKE  APPOINTMENT FOR REFILLS>, Disp: 30 tablet, Rfl: 0 .  FLAXSEED, LINSEED, PO, Take 1 capsule by mouth daily., Disp: , Rfl:  .  hydrochlorothiazide (MICROZIDE) 12.5 MG capsule, TAKE ONE CAPSULE BY MOUTH EVERY OTHER DAY, Disp: 15 capsule, Rfl: 0 .  isosorbide mononitrate (IMDUR) 120 MG 24 hr tablet, TAKE ONE TABLET BY MOUTH ONCE DAILY AS DIRECTED, Disp: 30 tablet, Rfl: 3 .  Multiple Vitamin (MULTIVITAMIN) tablet, Take 1 tablet by mouth daily., Disp: , Rfl:  .  vitamin B-12 (CYANOCOBALAMIN) 250 MCG tablet, Take 2,500 mcg by mouth daily., Disp: , Rfl:  .  cholecalciferol (VITAMIN D) 1000 UNITS tablet, Take 1,000 Units by mouth daily., Disp: , Rfl:  .  hydrocortisone (ANUSOL-HC) 25 MG suppository, Place 1 suppository (25 mg total) rectally at bedtime. (Patient not taking: Reported on 12/01/2016), Disp: 14 suppository, Rfl: 1 .  ketoconazole (NIZORAL) 2 % cream, Apply 1 application topically  daily., Disp: , Rfl:    Social History: Reviewed -  reports that she has never smoked. She has never used smokeless tobacco.  Objective Findings:  Vitals: Blood pressure (!) 142/74, pulse 70, weight 160 lb 12.8 oz (72.9 kg).  Physical Examination: General appearance - alert, well appearing, and in no distress Mental status - alert, oriented to person, place, and time Pelvic - not indicated  Discussion: Ultrasound and cancer marker was discussed with pt.  Assessment & Plan:   A:  1. no evidence of central pelvic mass   P:  1. Rx Miralax 1x/day for 1 week, repeat pelvic exam after bowels emptied    By signing my name below, I, [name], attest that this documentation has been prepared under the direction and in the presence of Jonnie Kind, MD. Electronically Signed: Jabier Gauss, ED Scribe. 12/01/16. 10:37 AM.  I personally performed the services described in this documentation, which was SCRIBED in my presence. The recorded information has been reviewed and considered accurate. It has been edited as necessary during review. Jonnie Kind, MD

## 2016-12-01 NOTE — Progress Notes (Signed)
PELVIC US TA/TV: normal vaginal cuff,normal right ovary (limited view),left ovary was not visualized,no adnexal masses seen,no free fluid

## 2016-12-08 ENCOUNTER — Encounter: Payer: Self-pay | Admitting: Obstetrics and Gynecology

## 2016-12-08 ENCOUNTER — Ambulatory Visit (INDEPENDENT_AMBULATORY_CARE_PROVIDER_SITE_OTHER): Payer: Medicare Other | Admitting: Obstetrics and Gynecology

## 2016-12-08 VITALS — BP 150/70 | HR 62 | Wt 160.4 lb

## 2016-12-08 DIAGNOSIS — R102 Pelvic and perineal pain: Secondary | ICD-10-CM | POA: Diagnosis not present

## 2016-12-08 DIAGNOSIS — R1900 Intra-abdominal and pelvic swelling, mass and lump, unspecified site: Secondary | ICD-10-CM

## 2016-12-08 NOTE — Progress Notes (Signed)
[facility] Visit  12/08/2016            Patient name: [name] MRN [id number]  Date of birth: 05-18-1933  CC & HPI:  [name] is a 83 y.o. female presenting today for f/u of central pelvic mass causing left-sided pain, moderate cystocele. Pt was given miralax to take for 1 week during last office visit, and is now returning for f/u.Marland Kitchen Has had loose BM's Pt went to Dr. Jeffie Pollock in [place] on 11/11/16 where "a tube" was inserted into her bladder, with pain resulting from the exam. Pt reports she has has gradual weight gain over the past year. Pt denies any blood in her stool.  ROS:  ROS  +pelvic pain -fever   Pertinent History Reviewed:   Reviewed: partial hysterectmy Medical         Past Medical History:  Diagnosis Date   Anemia    Anxiety    Arthritis    Bleeding from the nose Oct 2014   CAD (coronary artery disease)    Cataract of both eyes    Chronic kidney disease    Colon polyps    Hyperlipidemia    Hypertension    x 5-6 yrs.   LBBB (left bundle branch block)                               Surgical Hx:    Past Surgical History:  Procedure Laterality Date   BREAST SURGERY     masses removed while she was pregnant   CARDIAC CATHETERIZATION  06/20/2008   severe single vessel high-grade stenosis of mid LAD at bifurcation of diagonal 1 and setpal perforator 1; diffuse coronary calcification of L coronary system; 50% stenosis of mid RCA (Dr. Jackie Plum)   CATARACT EXTRACTION W/PHACO Right 10/10/2013   Procedure: CATARACT EXTRACTION PHACO AND INTRAOCULAR LENS PLACEMENT ([place]);  Surgeon: Tonny Branch, MD;  [facility];  Service: Ophthalmology;  Laterality: Right;  CDE:11.96   CATARACT EXTRACTION W/PHACO Left 11/18/2013   Procedure: CATARACT EXTRACTION PHACO AND INTRAOCULAR LENS PLACEMENT (IOC);  Surgeon: Tonny Branch, MD;  [facility];  Service: Ophthalmology;  Laterality: Left;  CDE 15.96   COLONOSCOPY N/A 01/16/2013   Procedure: COLONOSCOPY;   Surgeon: Rogene Houston, MD;  [facility];  Service: Endoscopy;  Laterality: N/A;  1200   CORONARY ANGIOPLASTY WITH STENT PLACEMENT  11/08/2010   complex high-speed rotational atherectomy of calcified LAD, diffusely disease LAD system (Dr. Corky Downs)   EXCISION ORAL TUMOR Right 01/14/2014   Procedure: EXCISION OROPHARYNGEAL MASS;  Surgeon: Ascencion Dike, MD;  Location: [place];  Service: ENT;  Laterality: Right;   NM MYOCAR [place]  05/2011   lexiscan myoview; normal pattern of perfusion in all regions; post-stress EF 55%; low risk scan    PARTIAL HYSTERECTOMY     TONSILLECTOMY     TRANSTHORACIC ECHOCARDIOGRAM  10/2010   EF 45-50%, mod conc LVH, grade 1 diastolic dysfunction, mildly calcified left AV cusp; calcified MV annulus   Medications: Reviewed & Updated - see associated section                       Current Outpatient Prescriptions:    ALPRAZolam (XANAX) 0.5 MG tablet, Take 0.25 mg by mouth at bedtime as needed for sleep. , Disp: , Rfl:    amLODipine (NORVASC) 5 MG  tablet, Take 1 tablet (5 mg total) by mouth daily., Disp: 30 tablet, Rfl: 10   aspirin 81 MG tablet, Take 81 mg by mouth daily. , Disp: , Rfl:    carvedilol (COREG) 25 MG tablet, Take 1 tablet (25 mg total) by mouth 2 (two) times daily with a meal. <PLEASE MAKE APPOINTMENT FOR REFILLS>, Disp: 30 tablet, Rfl: 0   cholecalciferol (VITAMIN D) 1000 UNITS tablet, Take 1,000 Units by mouth daily., Disp: , Rfl:    FLAXSEED, LINSEED, PO, Take 1 capsule by mouth daily., Disp: , Rfl:    hydrochlorothiazide (MICROZIDE) 12.5 MG capsule, TAKE ONE CAPSULE BY MOUTH EVERY OTHER DAY, Disp: 15 capsule, Rfl: 0   isosorbide mononitrate (IMDUR) 120 MG 24 hr tablet, TAKE ONE TABLET BY MOUTH ONCE DAILY AS DIRECTED, Disp: 30 tablet, Rfl: 3   ketoconazole (NIZORAL) 2 % cream, Apply 1 application topically daily., Disp: , Rfl:    Multiple Vitamin (MULTIVITAMIN) tablet, Take 1 tablet by mouth daily., Disp:  , Rfl:    vitamin B-12 (CYANOCOBALAMIN) 250 MCG tablet, Take 2,500 mcg by mouth daily., Disp: , Rfl:    hydrocortisone (ANUSOL-HC) 25 MG suppository, Place 1 suppository (25 mg total) rectally at bedtime. (Patient not taking: Reported on 12/01/2016), Disp: 14 suppository, Rfl: 1  Current Facility-Administered Medications:    polyethylene glycol powder (GLYCOLAX/MIRALAX) container 255 g, 1 Container, Oral, Once, Jonnie Kind, MD   Social History: Reviewed -  reports that she has never smoked. She has never used smokeless tobacco.  Objective Findings:  Vitals: Blood pressure (!) 150/70, pulse 62, weight 160 lb 6.4 oz (72.8 kg).  Physical Examination: General appearance - alert, well appearing, and in no distress Mental status - alert, oriented to person, place, and time Pelvic -  VULVA: normal appearing vulva with no masses, tenderness or lesions,  VAGINA: normal appearing vagina with normal color and discharge, no lesions,  CERVIX: not done  UTERUS: not present ADNEXA: not present RECTAL: guaiac negative stool obtained. negative without mass, lesions or tenderness. Bimanual: Mobile hard mass in mid pelvic region, reproduces the pain of dyspareiunia  Assessment & Plan:   A:  1. Tender pelvic mass,   P:  1. CT scan of abdomen and pelvis 2. F/u in 1 week    By signing my name below, I, [name], attest that this documentation has been prepared under the direction and in the presence of Jonnie Kind, MD. Electronically Signed: Jabier Gauss, Medical Scribe. 12/08/16. 10:09 AM.  I personally performed the services described in this documentation, which was SCRIBED in my presence. The recorded information has been reviewed and considered accurate. It has been edited as necessary during review. Jonnie Kind, MD

## 2016-12-10 DIAGNOSIS — R102 Pelvic and perineal pain: Secondary | ICD-10-CM | POA: Insufficient documentation

## 2016-12-12 ENCOUNTER — Other Ambulatory Visit: Payer: Self-pay | Admitting: *Deleted

## 2016-12-12 DIAGNOSIS — N189 Chronic kidney disease, unspecified: Secondary | ICD-10-CM

## 2016-12-19 ENCOUNTER — Other Ambulatory Visit: Payer: Self-pay | Admitting: Obstetrics and Gynecology

## 2016-12-19 ENCOUNTER — Ambulatory Visit (HOSPITAL_COMMUNITY)
Admission: RE | Admit: 2016-12-19 | Discharge: 2016-12-19 | Disposition: A | Discharge status: Home / Self Care | Payer: Medicare Other | Source: Ambulatory Visit | Attending: Obstetrics and Gynecology | Admitting: Obstetrics and Gynecology

## 2016-12-19 DIAGNOSIS — K5730 Diverticulosis of large intestine without perforation or abscess without bleeding: Secondary | ICD-10-CM | POA: Diagnosis not present

## 2016-12-19 DIAGNOSIS — Z90710 Acquired absence of both cervix and uterus: Secondary | ICD-10-CM | POA: Insufficient documentation

## 2016-12-19 DIAGNOSIS — N200 Calculus of kidney: Secondary | ICD-10-CM | POA: Insufficient documentation

## 2016-12-19 DIAGNOSIS — N8189 Other female genital prolapse: Secondary | ICD-10-CM | POA: Insufficient documentation

## 2016-12-19 DIAGNOSIS — N8110 Cystocele, unspecified: Secondary | ICD-10-CM | POA: Insufficient documentation

## 2016-12-19 DIAGNOSIS — R102 Pelvic and perineal pain: Secondary | ICD-10-CM | POA: Insufficient documentation

## 2016-12-19 DIAGNOSIS — I2510 Atherosclerotic heart disease of native coronary artery without angina pectoris: Secondary | ICD-10-CM | POA: Insufficient documentation

## 2016-12-19 DIAGNOSIS — M4854XA Collapsed vertebra, not elsewhere classified, thoracic region, initial encounter for fracture: Secondary | ICD-10-CM | POA: Insufficient documentation

## 2016-12-19 DIAGNOSIS — I700 Atherosclerosis of aorta: Secondary | ICD-10-CM | POA: Diagnosis not present

## 2016-12-19 LAB — POCT I-STAT CREATININE: CREATININE: 2.3 mg/dL — AB (ref 0.44–1.00)

## 2016-12-20 ENCOUNTER — Telehealth: Payer: Self-pay | Admitting: Obstetrics and Gynecology

## 2016-12-20 NOTE — Telephone Encounter (Signed)
Patient informed of normal CT scan results. Will follow up when necessary within the month

## 2016-12-21 ENCOUNTER — Ambulatory Visit: Payer: Medicare Other | Admitting: Obstetrics and Gynecology

## 2017-01-10 ENCOUNTER — Other Ambulatory Visit: Payer: Self-pay | Admitting: Cardiovascular Disease

## 2017-01-16 ENCOUNTER — Other Ambulatory Visit: Payer: Self-pay | Admitting: *Deleted

## 2017-01-16 MED ORDER — CARVEDILOL 25 MG PO TABS: 25.0000 mg | ORAL_TABLET | Freq: Two times a day (BID) | ORAL | 0 refills | Status: DC

## 2017-01-26 ENCOUNTER — Ambulatory Visit: Payer: Medicare Other | Admitting: Physician Assistant

## 2017-01-31 ENCOUNTER — Ambulatory Visit (INDEPENDENT_AMBULATORY_CARE_PROVIDER_SITE_OTHER): Payer: Medicare Other | Admitting: Cardiovascular Disease

## 2017-01-31 ENCOUNTER — Encounter: Payer: Self-pay | Admitting: Cardiovascular Disease

## 2017-01-31 VITALS — BP 162/82 | HR 76 | Ht 62.0 in | Wt 161.0 lb

## 2017-01-31 DIAGNOSIS — I10 Essential (primary) hypertension: Secondary | ICD-10-CM

## 2017-01-31 DIAGNOSIS — I447 Left bundle-branch block, unspecified: Secondary | ICD-10-CM

## 2017-01-31 DIAGNOSIS — I25118 Atherosclerotic heart disease of native coronary artery with other forms of angina pectoris: Secondary | ICD-10-CM

## 2017-01-31 DIAGNOSIS — E785 Hyperlipidemia, unspecified: Secondary | ICD-10-CM | POA: Diagnosis not present

## 2017-01-31 DIAGNOSIS — Z955 Presence of coronary angioplasty implant and graft: Secondary | ICD-10-CM | POA: Diagnosis not present

## 2017-01-31 MED ORDER — ISOSORBIDE MONONITRATE ER 120 MG PO TB24: 120.0000 mg | ORAL_TABLET | Freq: Every day | ORAL | 6 refills | Status: DC

## 2017-01-31 MED ORDER — CARVEDILOL 25 MG PO TABS: 25.0000 mg | ORAL_TABLET | Freq: Two times a day (BID) | ORAL | 6 refills | Status: DC

## 2017-01-31 MED ORDER — ROSUVASTATIN CALCIUM 5 MG PO TABS: 5.0000 mg | ORAL_TABLET | Freq: Every day | ORAL | 6 refills | Status: DC

## 2017-01-31 MED ORDER — AMLODIPINE BESYLATE 10 MG PO TABS: 10.0000 mg | ORAL_TABLET | Freq: Every day | ORAL | 6 refills | Status: DC

## 2017-01-31 NOTE — Patient Instructions (Signed)
Medication Instructions:   Increase Norvasc 10mg  daily.  Begin Crestor 5mg  daily.  Continue all other medications.    Labwork: none  Testing/Procedures: none  Follow-Up: Your physician wants you to follow up in: 6 months.  You will receive a reminder letter in the mail one-two months in advance.  If you don't receive a letter, please call our office to schedule the follow up appointment   Any Other Special Instructions Will Be Listed Below (If Applicable).  If you need a refill on your cardiac medications before your next appointment, please call your pharmacy.

## 2017-01-31 NOTE — Progress Notes (Signed)
SUBJECTIVE: The patient presents to establish cardiovascular care in the [facility] office with me. She previously saw Dr. Georgina Peer, most recently on 01/17/15.  She has a history of coronary artery disease and left bundle branch block. She previously underwent PCI in July 2012 of the LAD.  Nuclear stress test on 01/21/15 showed no evidence of myocardial ischemia or scar, LVEF 53%.  Echocardiogram 01/21/15 demonstrated normal left ventricular systolic function, LVEF 34-19%, mild LVH with grade 1 diastolic dysfunction. There was a small circumferential pericardial effusion noted.  ECG performed in the office today which I ordered and personally interpreted demonstrated sinus rhythm with a left bundle branch block.  The patient denies any symptoms of chest pain, palpitations, shortness of breath, lightheadedness, dizziness, leg swelling, orthopnea, PND, and syncope.  She wears compression stockings. She has chronic kidney disease.  I personally reviewed her blood pressure log which demonstrated systolic blood pressures which were consistently elevated in the 150 mmHg range.  Her husband has severe COPD and it has become difficult for them to drive to [facility] for their appointments.    Review of Systems: As per "subjective", otherwise negative.  No Known Allergies  Current Outpatient Prescriptions  Medication Sig Dispense Refill  . ALPRAZolam (XANAX) 0.5 MG tablet Take 0.25 mg by mouth at bedtime as needed for sleep.     Marland Kitchen amLODipine (NORVASC) 5 MG tablet Take 1 tablet (5 mg total) by mouth daily. 30 tablet 10  . aspirin 81 MG tablet Take 81 mg by mouth daily.     . carvedilol (COREG) 25 MG tablet Take 1 tablet (25 mg total) by mouth 2 (two) times daily with a meal. PLEASE KEEP APPOINTMENT. 60 tablet 0  . cholecalciferol (VITAMIN D) 1000 UNITS tablet Take 1,000 Units by mouth daily.    Marland Kitchen FLAXSEED, LINSEED, PO Take 1 capsule by mouth daily.    . hydrochlorothiazide (MICROZIDE) 12.5 MG  capsule TAKE ONE CAPSULE BY MOUTH EVERY OTHER DAY 15 capsule 0  . hydrocortisone (ANUSOL-HC) 25 MG suppository Place 1 suppository (25 mg total) rectally at bedtime. 14 suppository 1  . isosorbide mononitrate (IMDUR) 120 MG 24 hr tablet TAKE ONE TABLET BY MOUTH ONCE DAILY AS DIRECTED 30 tablet 3  . ketoconazole (NIZORAL) 2 % cream Apply 1 application topically daily.    . Multiple Vitamin (MULTIVITAMIN) tablet Take 1 tablet by mouth daily.    . vitamin B-12 (CYANOCOBALAMIN) 250 MCG tablet Take 2,500 mcg by mouth daily.     Current Facility-Administered Medications  Medication Dose Route Frequency Provider Last Rate Last Dose  . polyethylene glycol powder (GLYCOLAX/MIRALAX) container 255 g  1 Container Oral Once Jonnie Kind, MD        Past Medical History:  Diagnosis Date  . Anemia   . Anxiety   . Arthritis   . Bleeding from the nose Oct 2014  . CAD (coronary artery disease)   . Cataract of both eyes   . Chronic kidney disease   . Colon polyps   . Hyperlipidemia   . Hypertension    x 5-6 yrs.  Marland Kitchen LBBB (left bundle branch block)     Past Surgical History:  Procedure Laterality Date  . BREAST SURGERY     masses removed while she was pregnant  . CARDIAC CATHETERIZATION  06/20/2008   severe single vessel high-grade stenosis of mid LAD at bifurcation of diagonal 1 and setpal perforator 1; diffuse coronary calcification of L coronary system; 50% stenosis of mid  RCA (Dr. Jackie Plum)  . CATARACT EXTRACTION W/PHACO Right 10/10/2013   Procedure: CATARACT EXTRACTION PHACO AND INTRAOCULAR LENS PLACEMENT (IOC);  Surgeon: Tonny Branch, MD;  Location: [facility];  Service: Ophthalmology;  Laterality: Right;  CDE:11.96  . CATARACT EXTRACTION W/PHACO Left 11/18/2013   Procedure: CATARACT EXTRACTION PHACO AND INTRAOCULAR LENS PLACEMENT (IOC);  Surgeon: Tonny Branch, MD;  Location: [facility];  Service: Ophthalmology;  Laterality: Left;  CDE 15.96  . COLONOSCOPY N/A 01/16/2013   Procedure: COLONOSCOPY;  Surgeon:  Rogene Houston, MD;  [facility]: [facility];  Service: Endoscopy;  Laterality: N/A;  1200  . CORONARY ANGIOPLASTY WITH STENT PLACEMENT  11/08/2010   complex high-speed rotational atherectomy of calcified LAD, diffusely disease LAD system (Dr. Corky Downs)  . EXCISION ORAL TUMOR Right 01/14/2014   Procedure: EXCISION OROPHARYNGEAL MASS;  Surgeon: Ascencion Dike, MD;  Location: [place];  Service: ENT;  Laterality: Right;  . NM MYOCAR PERF WALL MOTION  05/2011   lexiscan myoview; normal pattern of perfusion in all regions; post-stress EF 55%; low risk scan   . PARTIAL HYSTERECTOMY    . TONSILLECTOMY    . TRANSTHORACIC ECHOCARDIOGRAM  10/2010   EF 45-50%, mod conc LVH, grade 1 diastolic dysfunction, mildly calcified left AV cusp; calcified MV annulus    Social History   Social History  . Marital status: Married    Spouse name: N/A  . Number of children: 4  . Years of education: 8   Occupational History  . Not on file.   Social History Main Topics  . Smoking status: Never Smoker  . Smokeless tobacco: Never Used  . Alcohol use No  . Drug use: No  . Sexual activity: Yes    Birth control/ protection: Surgical   Other Topics Concern  . Not on file   Social History Narrative  . No narrative on file     Vitals:   01/31/17 1516  BP: (!) 162/82  Pulse: 76  SpO2: 97%  Weight: 161 lb (73 kg)  Height: 5\' 2"  (1.575 m)    Wt Readings from Last 3 Encounters:  01/31/17 161 lb (73 kg)  12/08/16 160 lb 6.4 oz (72.8 kg)  12/01/16 160 lb 12.8 oz (72.9 kg)     PHYSICAL EXAM General: NAD HEENT: Normal. Neck: No JVD, no thyromegaly. Lungs: Clear to auscultation bilaterally with normal respiratory effort. CV: Nondisplaced PMI.  Regular rate and rhythm, normal S1/S2, no S3/S4, no murmur. No pretibial or periankle edema.  No carotid bruit.   Abdomen: Soft, nontender, no distention.  Neurologic: Alert and oriented.  Psych: Normal affect. Skin: Normal. Musculoskeletal: No  gross deformities.    ECG: Most recent ECG reviewed.   Labs: Lab Results  Component Value Date/Time   K 4.3 05/13/2014 08:46 AM   BUN 25 (H) 05/13/2014 08:46 AM   CREATININE 2.30 (H) 12/19/2016 01:13 PM   CREATININE 1.49 (H) 05/13/2014 08:46 AM   ALT 20 05/13/2014 08:46 AM   TSH 2.162 05/13/2014 08:46 AM   HGB 11.6 (L) 05/13/2014 08:46 AM     Lipids: Lab Results  Component Value Date/Time   LDLCALC 94 05/13/2014 08:46 AM   CHOL 164 05/13/2014 08:46 AM   TRIG 118 05/13/2014 08:46 AM   HDL 46 05/13/2014 08:46 AM       ASSESSMENT AND PLAN: 1. Coronary artery disease with prior LAD PCI: Continue aspirin, long-acting nitrates, and carvedilol. Unclear why she is not on statin therapy. I will obtain  a copy of her most recent lipid profile. I will increase amlodipine to better control blood pressure. I will start Crestor 5 mg daily for its pleiotropic effects.  2. Hypertension: Elevated. I will increase amlodipine to 10 mg daily.      Disposition: Follow up 6 months.   Kate Sable, M.D., F.A.C.C.

## 2017-02-01 ENCOUNTER — Encounter: Payer: Self-pay | Admitting: *Deleted

## 2017-02-08 ENCOUNTER — Ambulatory Visit: Payer: Medicare Other | Admitting: Cardiovascular Disease

## 2017-02-13 DIAGNOSIS — N183 Chronic kidney disease, stage 3 (moderate): Secondary | ICD-10-CM | POA: Diagnosis not present

## 2017-02-23 DIAGNOSIS — I10 Essential (primary) hypertension: Secondary | ICD-10-CM | POA: Diagnosis not present

## 2017-02-23 DIAGNOSIS — I2510 Atherosclerotic heart disease of native coronary artery without angina pectoris: Secondary | ICD-10-CM | POA: Diagnosis not present

## 2017-02-23 DIAGNOSIS — N183 Chronic kidney disease, stage 3 (moderate): Secondary | ICD-10-CM | POA: Diagnosis not present

## 2017-02-24 ENCOUNTER — Telehealth: Payer: Self-pay | Admitting: *Deleted

## 2017-02-24 NOTE — Telephone Encounter (Signed)
Please see BP log scanned into EPIC.  Per Dr. Bronson Ing no changes at this time.  Patient notified.  Stated that she was a little concerned about the few readings she had where the bottom number was in the 40's.  Explained to her that if she consistently has low readings to let us know.  She did c/o weakness, but this is not new.  Questioned if she was taking in enough fluids.  Stated she drinks tea all the time.  Suggested she may try some G2 to help with her electrolytes which may help that pressure a little.  Stated she will try this.

## 2017-03-13 DIAGNOSIS — N183 Chronic kidney disease, stage 3 (moderate): Secondary | ICD-10-CM | POA: Diagnosis not present

## 2017-04-03 ENCOUNTER — Ambulatory Visit: Payer: Medicare Other | Admitting: Obstetrics and Gynecology

## 2017-04-03 ENCOUNTER — Encounter: Payer: Self-pay | Admitting: Obstetrics and Gynecology

## 2017-04-03 VITALS — BP 140/90 | HR 95 | Wt 161.2 lb

## 2017-04-03 DIAGNOSIS — R1031 Right lower quadrant pain: Secondary | ICD-10-CM | POA: Diagnosis not present

## 2017-04-03 DIAGNOSIS — L905 Scar conditions and fibrosis of skin: Secondary | ICD-10-CM | POA: Diagnosis not present

## 2017-04-03 NOTE — Progress Notes (Signed)
Patient ID: [name], female   DOB: 10-06-1933, 83 y.o.   MRN: [id number]   [facility] Visit  @DATE @            Patient name: [name] MRN [id number]  Date of birth: 1933/07/01  CC & HPI:  [name] is a 83 y.o. female presenting today for right groin pain that intermittently bothers her each month. Her pain is worse after sitting for long periods of time. She has tried a heating pad and antibiotic cream for relief. She states that her pain will radiate to her lower back. The patient feels as if she can not fully empty her bladder and states she will pee on herself. She was going to have a sling placed, but decided against it. She denies any other symptoms or complaints at this time.   ROS:  ROS +R groin pain +stress incontinence All systems are negative except as noted in the HPI and PMH.   Pertinent History Reviewed:   Reviewed: Medical         Past Medical History:  Diagnosis Date  . Anemia   . Anxiety   . Arthritis   . Bleeding from the nose Oct 2014  . CAD (coronary artery disease)   . Cataract of both eyes   . Chronic kidney disease   . Colon polyps   . Hyperlipidemia   . Hypertension    x 5-6 yrs.  Marland Kitchen LBBB (left bundle branch block)                               Surgical Hx:    Past Surgical History:  Procedure Laterality Date  . BREAST SURGERY     masses removed while she was pregnant  . CARDIAC CATHETERIZATION  06/20/2008   severe single vessel high-grade stenosis of mid LAD at bifurcation of diagonal 1 and setpal perforator 1; diffuse coronary calcification of L coronary system; 50% stenosis of mid RCA (Dr. Jackie Plum)  . CATARACT EXTRACTION W/PHACO Right 10/10/2013   Procedure: CATARACT EXTRACTION PHACO AND INTRAOCULAR LENS PLACEMENT (IOC);  Surgeon: Tonny Branch, MD;  Location: [facility];  Service: Ophthalmology;  Laterality: Right;  CDE:11.96  . CATARACT EXTRACTION W/PHACO Left 11/18/2013   Procedure: CATARACT EXTRACTION PHACO AND INTRAOCULAR LENS  PLACEMENT (IOC);  Surgeon: Tonny Branch, MD;  Location: [facility];  Service: Ophthalmology;  Laterality: Left;  CDE 15.96  . COLONOSCOPY N/A 01/16/2013   Procedure: COLONOSCOPY;  Surgeon: Rogene Houston, MD;  [facility];  Service: Endoscopy;  Laterality: N/A;  1200  . CORONARY ANGIOPLASTY WITH STENT PLACEMENT  11/08/2010   complex high-speed rotational atherectomy of calcified LAD, diffusely disease LAD system (Dr. Corky Downs)  . EXCISION ORAL TUMOR Right 01/14/2014   Procedure: EXCISION OROPHARYNGEAL MASS;  Surgeon: Ascencion Dike, MD;  Location: [place];  Service: ENT;  Laterality: Right;  . NM MYOCAR PERF WALL MOTION  05/2011   lexiscan myoview; normal pattern of perfusion in all regions; post-stress EF 55%; low risk scan   . PARTIAL HYSTERECTOMY    . TONSILLECTOMY    . TRANSTHORACIC ECHOCARDIOGRAM  10/2010   EF 45-50%, mod conc LVH, grade 1 diastolic dysfunction, mildly calcified left AV cusp; calcified MV annulus   Medications: Reviewed & Updated - see associated section  Current Outpatient Medications:  .  ALPRAZolam (XANAX) 0.5 MG tablet, Take 0.25 mg by mouth at bedtime as needed for sleep. , Disp: , Rfl:  .  amLODipine (NORVASC) 10 MG tablet, Take 1 tablet (10 mg total) by mouth daily., Disp: 30 tablet, Rfl: 6 .  aspirin 81 MG tablet, Take 81 mg by mouth daily. , Disp: , Rfl:  .  carvedilol (COREG) 25 MG tablet, Take 1 tablet (25 mg total) by mouth 2 (two) times daily with a meal., Disp: 60 tablet, Rfl: 6 .  FLAXSEED, LINSEED, PO, Take 1 capsule by mouth daily., Disp: , Rfl:  .  hydrochlorothiazide (MICROZIDE) 12.5 MG capsule, TAKE ONE CAPSULE BY MOUTH EVERY OTHER DAY, Disp: 15 capsule, Rfl: 0 .  hydrocortisone (ANUSOL-HC) 25 MG suppository, Place 1 suppository (25 mg total) rectally at bedtime., Disp: 14 suppository, Rfl: 1 .  isosorbide mononitrate (IMDUR) 120 MG 24 hr tablet, Take 1 tablet (120 mg total) by mouth daily., Disp: 30 tablet, Rfl:  6 .  Multiple Vitamin (MULTIVITAMIN) tablet, Take 1 tablet by mouth daily., Disp: , Rfl:  .  vitamin B-12 (CYANOCOBALAMIN) 250 MCG tablet, Take 2,500 mcg by mouth daily., Disp: , Rfl:  .  cholecalciferol (VITAMIN D) 1000 UNITS tablet, Take 1,000 Units by mouth daily., Disp: , Rfl:  .  ketoconazole (NIZORAL) 2 % cream, Apply 1 application topically daily., Disp: , Rfl:  .  rosuvastatin (CRESTOR) 5 MG tablet, Take 1 tablet (5 mg total) by mouth daily. (Patient not taking: Reported on 04/03/2017), Disp: 30 tablet, Rfl: 6  Current Facility-Administered Medications:  .  polyethylene glycol powder (GLYCOLAX/MIRALAX) container 255 g, 1 Container, Oral, Once, Jonnie Kind, MD   Social History: Reviewed -  reports that  has never smoked. she has never used smokeless tobacco.  Objective Findings:  Vitals: Blood pressure 140/90, pulse 95, weight 161 lb 3.2 oz (73.1 kg).  PHYSICAL EXAMINATION  General appearance - alert, well appearing, and in no distress and oriented to person, place, and time Mental status - alert, oriented to person, place, and time, normal mood, behavior, speech, dress, motor activity, and thought processes, affect appropriate to mood PELVIC Good perineal support. Small cystocele.no rotation or descent of the urethra on Valsalva maneuver Left vaginal cuff thickening, with negative workuprecently completed including CT abdomen pelvis and ultrasound  Assessment & Plan:   A:  1. Right inguinal pain likely MSK origin  2.  Vaginal cuff thickening on the left status post hysterectomy 3. Small cystocele, no evidence of urethral support loss  P:  1. Stretching and Yoga classes 2. Heating pad  By signing my name below, I, Margit Banda, attest that this documentation has been prepared under the direction and in the presence of Jonnie Kind, MD. Electronically Signed: Margit Banda, Medical Scribe. 04/03/17. 3:41 PM.  I personally performed the services described in  this documentation, which was SCRIBED in my presence. The recorded information has been reviewed and considered accurate. It has been edited as necessary during review. Jonnie Kind, MD

## 2017-05-11 DIAGNOSIS — M533 Sacrococcygeal disorders, not elsewhere classified: Secondary | ICD-10-CM | POA: Diagnosis not present

## 2017-05-11 DIAGNOSIS — Z1389 Encounter for screening for other disorder: Secondary | ICD-10-CM | POA: Diagnosis not present

## 2017-06-12 ENCOUNTER — Ambulatory Visit (INDEPENDENT_AMBULATORY_CARE_PROVIDER_SITE_OTHER): Payer: Medicare Other | Admitting: Otolaryngology

## 2017-06-12 DIAGNOSIS — R070 Pain in throat: Secondary | ICD-10-CM | POA: Diagnosis not present

## 2017-06-12 DIAGNOSIS — K219 Gastro-esophageal reflux disease without esophagitis: Secondary | ICD-10-CM | POA: Diagnosis not present

## 2017-06-14 DIAGNOSIS — N183 Chronic kidney disease, stage 3 (moderate): Secondary | ICD-10-CM | POA: Diagnosis not present

## 2017-06-16 DIAGNOSIS — I2510 Atherosclerotic heart disease of native coronary artery without angina pectoris: Secondary | ICD-10-CM | POA: Diagnosis not present

## 2017-06-16 DIAGNOSIS — N183 Chronic kidney disease, stage 3 (moderate): Secondary | ICD-10-CM | POA: Diagnosis not present

## 2017-06-16 DIAGNOSIS — I129 Hypertensive chronic kidney disease with stage 1 through stage 4 chronic kidney disease, or unspecified chronic kidney disease: Secondary | ICD-10-CM | POA: Diagnosis not present

## 2017-06-28 DIAGNOSIS — C44529 Squamous cell carcinoma of skin of other part of trunk: Secondary | ICD-10-CM | POA: Diagnosis not present

## 2017-06-28 DIAGNOSIS — C44329 Squamous cell carcinoma of skin of other parts of face: Secondary | ICD-10-CM | POA: Diagnosis not present

## 2017-07-24 ENCOUNTER — Ambulatory Visit (INDEPENDENT_AMBULATORY_CARE_PROVIDER_SITE_OTHER): Payer: Medicare Other | Admitting: Otolaryngology

## 2017-07-24 DIAGNOSIS — K219 Gastro-esophageal reflux disease without esophagitis: Secondary | ICD-10-CM | POA: Diagnosis not present

## 2017-07-31 ENCOUNTER — Encounter: Payer: Self-pay | Admitting: Cardiovascular Disease

## 2017-07-31 ENCOUNTER — Ambulatory Visit (INDEPENDENT_AMBULATORY_CARE_PROVIDER_SITE_OTHER): Payer: Medicare Other | Admitting: Cardiovascular Disease

## 2017-07-31 VITALS — BP 123/66 | HR 64 | Ht 62.0 in | Wt 160.0 lb

## 2017-07-31 DIAGNOSIS — Z955 Presence of coronary angioplasty implant and graft: Secondary | ICD-10-CM

## 2017-07-31 DIAGNOSIS — I447 Left bundle-branch block, unspecified: Secondary | ICD-10-CM

## 2017-07-31 DIAGNOSIS — I25118 Atherosclerotic heart disease of native coronary artery with other forms of angina pectoris: Secondary | ICD-10-CM | POA: Diagnosis not present

## 2017-07-31 DIAGNOSIS — I10 Essential (primary) hypertension: Secondary | ICD-10-CM

## 2017-07-31 NOTE — Progress Notes (Signed)
SUBJECTIVE: The patient presents for routine follow-up.  I first evaluated her on 01/31/17. She has a history of coronary artery disease and left bundle branch block. She previously underwent PCI in July 2012 of the LAD.  Nuclear stress test on 01/21/15 showed no evidence of myocardial ischemia or scar, LVEF 53%.  Echocardiogram 01/21/15 demonstrated normal left ventricular systolic function, LVEF 93-81%, mild LVH with grade 1 diastolic dysfunction. There was a small circumferential pericardial effusion noted.  She wears compression stockings. She has chronic kidney disease.  She has been struggling with her husband's ill health.  He has severe COPD and was diagnosed with liver cancer recurrence in the past 6 months.  He does not want to undergo surgery.  He constantly requests that she be by his side.  This leads to anxiety and stress for her.  She said she does not get any regular exercise as she does a lot of sitting with him.  She did do some yard work the other day and was lifting tree limbs without difficulty.  She has occasional "fluttering sensations "in her chest which are alleviated with belching.  She takes Xanax every night to help relax her.  She said it reduces her blood pressure as well.     Review of Systems: As per "subjective", otherwise negative.  No Known Allergies  Current Outpatient Medications  Medication Sig Dispense Refill  . ALPRAZolam (XANAX) 0.5 MG tablet Take 0.25 mg by mouth at bedtime as needed for sleep.     Marland Kitchen amLODipine (NORVASC) 10 MG tablet Take 1 tablet (10 mg total) by mouth daily. 30 tablet 6  . aspirin 81 MG tablet Take 81 mg by mouth daily.     . carvedilol (COREG) 25 MG tablet Take 1 tablet (25 mg total) by mouth 2 (two) times daily with a meal. 60 tablet 6  . FLAXSEED, LINSEED, PO Take 1 capsule by mouth daily.    . hydrochlorothiazide (MICROZIDE) 12.5 MG capsule Take 12.5 mg by mouth daily as needed.    . hydrocortisone (ANUSOL-HC) 25 MG  suppository Place 1 suppository (25 mg total) rectally at bedtime. (Patient taking differently: Place 25 mg rectally at bedtime as needed. ) 14 suppository 1  . isosorbide mononitrate (IMDUR) 120 MG 24 hr tablet Take 1 tablet (120 mg total) by mouth daily. 30 tablet 6  . ketoconazole (NIZORAL) 2 % cream Apply 1 application topically daily as needed.     . Multiple Vitamin (MULTIVITAMIN) tablet Take 1 tablet by mouth daily.    . vitamin B-12 (CYANOCOBALAMIN) 250 MCG tablet Take 2,500 mcg by mouth daily.    . rosuvastatin (CRESTOR) 5 MG tablet Take 1 tablet (5 mg total) by mouth daily. (Patient not taking: Reported on 04/03/2017) 30 tablet 6   Current Facility-Administered Medications  Medication Dose Route Frequency Provider Last Rate Last Dose  . polyethylene glycol powder (GLYCOLAX/MIRALAX) container 255 g  1 Container Oral Once Jonnie Kind, MD        Past Medical History:  Diagnosis Date  . Anemia   . Anxiety   . Arthritis   . Bleeding from the nose Oct 2014  . CAD (coronary artery disease)   . Cataract of both eyes   . Chronic kidney disease   . Colon polyps   . Hyperlipidemia   . Hypertension    x 5-6 yrs.  Marland Kitchen LBBB (left bundle branch block)     Past Surgical History:  Procedure Laterality Date  .  BREAST SURGERY     masses removed while she was pregnant  . CARDIAC CATHETERIZATION  06/20/2008   severe single vessel high-grade stenosis of mid LAD at bifurcation of diagonal 1 and setpal perforator 1; diffuse coronary calcification of L coronary system; 50% stenosis of mid RCA (Dr. Jackie Plum)  . CATARACT EXTRACTION W/PHACO Right 10/10/2013   Procedure: CATARACT EXTRACTION PHACO AND INTRAOCULAR LENS PLACEMENT (IOC);  Surgeon: Tonny Branch, MD;  [facility];  Service: Ophthalmology;  Laterality: Right;  CDE:11.96  . CATARACT EXTRACTION W/PHACO Left 11/18/2013   Procedure: CATARACT EXTRACTION PHACO AND INTRAOCULAR LENS PLACEMENT (IOC);  Surgeon: Tonny Branch, MD;  [facility];   Service: Ophthalmology;  Laterality: Left;  CDE 15.96  . COLONOSCOPY N/A 01/16/2013   Procedure: COLONOSCOPY;  Surgeon: Rogene Houston, MD;  [facility];  Service: Endoscopy;  Laterality: N/A;  1200  . CORONARY ANGIOPLASTY WITH STENT PLACEMENT  11/08/2010   complex high-speed rotational atherectomy of calcified LAD, diffusely disease LAD system (Dr. Corky Downs)  . EXCISION ORAL TUMOR Right 01/14/2014   Procedure: EXCISION OROPHARYNGEAL MASS;  Surgeon: Ascencion Dike, MD;  Location: [place];  Service: ENT;  Laterality: Right;  . NM MYOCAR PERF WALL MOTION  05/2011   lexiscan myoview; normal pattern of perfusion in all regions; post-stress EF 55%; low risk scan   . PARTIAL HYSTERECTOMY    . TONSILLECTOMY    . TRANSTHORACIC ECHOCARDIOGRAM  10/2010   EF 45-50%, mod conc LVH, grade 1 diastolic dysfunction, mildly calcified left AV cusp; calcified MV annulus    Social History   Socioeconomic History  . Marital status: Married    Spouse name: Not on file  . Number of children: 4  . Years of education: 8  . Highest education level: Not on file  Occupational History  . Not on file  Social Needs  . Financial resource strain: Not on file  . Food insecurity:    Worry: Not on file    Inability: Not on file  . Transportation needs:    Medical: Not on file    Non-medical: Not on file  Tobacco Use  . Smoking status: Never Smoker  . Smokeless tobacco: Never Used  Substance and Sexual Activity  . Alcohol use: No  . Drug use: No  . Sexual activity: Yes    Birth control/protection: Surgical  Lifestyle  . Physical activity:    Days per week: Not on file    Minutes per session: Not on file  . Stress: Not on file  Relationships  . Social connections:    Talks on phone: Not on file    Gets together: Not on file    Attends religious service: Not on file    Active member of club or organization: Not on file    Attends meetings of clubs or organizations: Not on file     Relationship status: Not on file  . Intimate partner violence:    Fear of current or ex partner: Not on file    Emotionally abused: Not on file    Physically abused: Not on file    Forced sexual activity: Not on file  Other Topics Concern  . Not on file  Social History Narrative  . Not on file     Vitals:   07/31/17 1031  BP: 123/66  Pulse: 64  Weight: 160 lb (72.6 kg)  Height: 5\' 2"  (1.575 m)    Wt Readings from Last 3 Encounters:  07/31/17 160 lb (72.6 kg)  04/03/17 161 lb 3.2 oz (73.1 kg)  01/31/17 161 lb (73 kg)     PHYSICAL EXAM General: NAD HEENT: Normal. Neck: No JVD, no thyromegaly. Lungs: Clear to auscultation bilaterally with normal respiratory effort. CV: Regular rate and rhythm, normal S1/S2, no S3/S4, no murmur. No pretibial or periankle edema.  No carotid bruit.   Abdomen: Soft, nontender, no distention.  Neurologic: Alert and oriented.  Psych: Normal affect. Skin: Normal. Musculoskeletal: No gross deformities.    ECG: Most recent ECG reviewed.   Labs: Lab Results  Component Value Date/Time   K 4.3 05/13/2014 08:46 AM   BUN 25 (H) 05/13/2014 08:46 AM   CREATININE 2.30 (H) 12/19/2016 01:13 PM   CREATININE 1.49 (H) 05/13/2014 08:46 AM   ALT 20 05/13/2014 08:46 AM   TSH 2.162 05/13/2014 08:46 AM   HGB 11.6 (L) 05/13/2014 08:46 AM     Lipids: Lab Results  Component Value Date/Time   LDLCALC 94 05/13/2014 08:46 AM   CHOL 164 05/13/2014 08:46 AM   TRIG 118 05/13/2014 08:46 AM   HDL 46 05/13/2014 08:46 AM       ASSESSMENT AND PLAN: 1. Coronary artery disease with prior LAD PCI: Symptomatically stable.  Continue aspirin, long-acting nitrates, Crestor 5 mg, and carvedilol. I will refill all of her medications.  2. Hypertension: Blood pressure is normal.  I increased amlodipine to 10 mg at her last visit.  No changes to therapy.      Disposition: Follow up 1 year   Kate Sable, M.D., F.A.C.C.

## 2017-07-31 NOTE — Patient Instructions (Signed)

## 2017-09-25 DIAGNOSIS — Z85828 Personal history of other malignant neoplasm of skin: Secondary | ICD-10-CM | POA: Diagnosis not present

## 2017-09-25 DIAGNOSIS — Z08 Encounter for follow-up examination after completed treatment for malignant neoplasm: Secondary | ICD-10-CM | POA: Diagnosis not present

## 2017-09-25 DIAGNOSIS — C44311 Basal cell carcinoma of skin of nose: Secondary | ICD-10-CM | POA: Diagnosis not present

## 2017-10-17 DIAGNOSIS — R7309 Other abnormal glucose: Secondary | ICD-10-CM | POA: Diagnosis not present

## 2017-10-17 DIAGNOSIS — I10 Essential (primary) hypertension: Secondary | ICD-10-CM | POA: Diagnosis not present

## 2017-10-17 DIAGNOSIS — I2510 Atherosclerotic heart disease of native coronary artery without angina pectoris: Secondary | ICD-10-CM | POA: Diagnosis not present

## 2017-10-17 DIAGNOSIS — E785 Hyperlipidemia, unspecified: Secondary | ICD-10-CM | POA: Diagnosis not present

## 2017-10-17 DIAGNOSIS — Z0001 Encounter for general adult medical examination with abnormal findings: Secondary | ICD-10-CM | POA: Diagnosis not present

## 2017-10-17 DIAGNOSIS — M25512 Pain in left shoulder: Secondary | ICD-10-CM | POA: Diagnosis not present

## 2017-10-17 DIAGNOSIS — Z1389 Encounter for screening for other disorder: Secondary | ICD-10-CM | POA: Diagnosis not present

## 2017-10-30 ENCOUNTER — Ambulatory Visit (INDEPENDENT_AMBULATORY_CARE_PROVIDER_SITE_OTHER): Payer: Medicare Other | Admitting: Otolaryngology

## 2017-10-30 DIAGNOSIS — R070 Pain in throat: Secondary | ICD-10-CM

## 2017-10-30 DIAGNOSIS — K219 Gastro-esophageal reflux disease without esophagitis: Secondary | ICD-10-CM

## 2017-11-06 ENCOUNTER — Other Ambulatory Visit (HOSPITAL_COMMUNITY): Payer: Self-pay | Admitting: Family Medicine

## 2017-11-06 DIAGNOSIS — Z1231 Encounter for screening mammogram for malignant neoplasm of breast: Secondary | ICD-10-CM

## 2017-11-23 ENCOUNTER — Other Ambulatory Visit (HOSPITAL_COMMUNITY): Payer: Self-pay | Admitting: Family Medicine

## 2017-11-23 ENCOUNTER — Ambulatory Visit (HOSPITAL_COMMUNITY)
Admission: RE | Admit: 2017-11-23 | Discharge: 2017-11-23 | Disposition: A | Discharge status: Home / Self Care | Payer: Medicare Other | Source: Ambulatory Visit | Attending: Family Medicine | Admitting: Family Medicine

## 2017-11-23 DIAGNOSIS — Z1231 Encounter for screening mammogram for malignant neoplasm of breast: Secondary | ICD-10-CM

## 2017-12-21 ENCOUNTER — Ambulatory Visit: Payer: Medicare Other | Admitting: Obstetrics and Gynecology

## 2017-12-22 ENCOUNTER — Ambulatory Visit: Payer: Medicare Other | Admitting: Urology

## 2017-12-27 ENCOUNTER — Ambulatory Visit: Payer: Medicare Other | Admitting: Obstetrics and Gynecology

## 2017-12-27 ENCOUNTER — Encounter: Payer: Self-pay | Admitting: Obstetrics and Gynecology

## 2017-12-27 ENCOUNTER — Other Ambulatory Visit: Payer: Self-pay

## 2017-12-27 DIAGNOSIS — N3941 Urge incontinence: Secondary | ICD-10-CM | POA: Diagnosis not present

## 2017-12-27 MED ORDER — OXYBUTYNIN CHLORIDE 5 MG PO TABS: 5.0000 mg | ORAL_TABLET | Freq: Two times a day (BID) | ORAL | 3 refills | Status: DC | PRN

## 2017-12-27 NOTE — Patient Instructions (Signed)

## 2017-12-27 NOTE — Progress Notes (Signed)
Patient ID: SUMAYYA MUHA, female   DOB: 1933-07-14, 84 y.o.   MRN: [id number]   [facility] Visit  @DATE @            Patient name: [name] MRN [id number]  Date of birth: 1933/07/06  CC & HPI:  [name] is a 84 y.o. female presenting today for urinary urgency.  Can't retain water well. There is burning sensation before the urine voids. She urinary urgency and doesn't always make it to the bathroom before peeing. Physical done recently showed only 30% of her kidneys functional.  ROS:  ROS +urinary urgency +urinary incontinence -fever -chills All systems are negative except as noted in the HPI and PMH.   Pertinent History Reviewed:   Reviewed: Significant for hysterectomy Medical         Past Medical History:  Diagnosis Date  . Anemia   . Anxiety   . Arthritis   . Bleeding from the nose Oct 2014  . CAD (coronary artery disease)   . Cataract of both eyes   . Chronic kidney disease   . Colon polyps   . Hyperlipidemia   . Hypertension    x 5-6 yrs.  Marland Kitchen LBBB (left bundle branch block)                               Surgical Hx:    Past Surgical History:  Procedure Laterality Date  . BREAST SURGERY     masses removed while she was pregnant  . CARDIAC CATHETERIZATION  06/20/2008   severe single vessel high-grade stenosis of mid LAD at bifurcation of diagonal 1 and setpal perforator 1; diffuse coronary calcification of L coronary system; 50% stenosis of mid RCA (Dr. Jackie Plum)  . CATARACT EXTRACTION W/PHACO Right 10/10/2013   Procedure: CATARACT EXTRACTION PHACO AND INTRAOCULAR LENS PLACEMENT (IOC);  Surgeon: Tonny Branch, MD;  Location: [facility];  Service: Ophthalmology;  Laterality: Right;  CDE:11.96  . CATARACT EXTRACTION W/PHACO Left 11/18/2013   Procedure: CATARACT EXTRACTION PHACO AND INTRAOCULAR LENS PLACEMENT (IOC);  Surgeon: Tonny Branch, MD;  Location: [facility];  Service: Ophthalmology;  Laterality: Left;  CDE 15.96  . COLONOSCOPY N/A 01/16/2013   Procedure:  COLONOSCOPY;  Surgeon: Rogene Houston, MD;  Location: [facility];  Service: Endoscopy;  Laterality: N/A;  1200  . CORONARY ANGIOPLASTY WITH STENT PLACEMENT  11/08/2010   complex high-speed rotational atherectomy of calcified LAD, diffusely disease LAD system (Dr. Corky Downs)  . EXCISION ORAL TUMOR Right 01/14/2014   Procedure: EXCISION OROPHARYNGEAL MASS;  Surgeon: Ascencion Dike, MD;  Location: [facility];  Service: ENT;  Laterality: Right;  . NM MYOCAR PERF WALL MOTION  05/2011   lexiscan myoview; normal pattern of perfusion in all regions; post-stress EF 55%; low risk scan   . PARTIAL HYSTERECTOMY    . TONSILLECTOMY    . TRANSTHORACIC ECHOCARDIOGRAM  10/2010   EF 45-50%, mod conc LVH, grade 1 diastolic dysfunction, mildly calcified left AV cusp; calcified MV annulus   Medications: Reviewed & Updated - see associated section                       Current Outpatient Medications:  .  amLODipine (NORVASC) 10 MG tablet, Take 1 tablet (10 mg total) by mouth daily., Disp: 30 tablet, Rfl: 6 .  aspirin 81 MG tablet, Take 81 mg by mouth daily. ,  Disp: , Rfl:  .  carvedilol (COREG) 25 MG tablet, Take 1 tablet (25 mg total) by mouth 2 (two) times daily with a meal., Disp: 60 tablet, Rfl: 6 .  FLAXSEED, LINSEED, PO, Take 1 capsule by mouth daily., Disp: , Rfl:  .  furosemide (LASIX) 40 MG tablet, , Disp: , Rfl:  .  isosorbide mononitrate (IMDUR) 120 MG 24 hr tablet, Take 1 tablet (120 mg total) by mouth daily., Disp: 30 tablet, Rfl: 6 .  Multiple Vitamin (MULTIVITAMIN) tablet, Take 1 tablet by mouth daily., Disp: , Rfl:  .  omeprazole (PRILOSEC) 20 MG capsule, Take 20 mg by mouth daily., Disp: , Rfl:  .  Vitamin D, Ergocalciferol, (DRISDOL) 50000 units CAPS capsule, TAKE 1 CAPSULE BY MOUTH ONCE A WEEK PT TO TAKE 2000 UNITS OF VITAMIN D OVER THE COUNTER DAILY AFTER THIS RX IS FINISHED, Disp: , Rfl: 0 .  hydrocortisone (ANUSOL-HC) 25 MG suppository, Place 1 suppository (25 mg total) rectally at  bedtime. (Patient not taking: Reported on 12/27/2017), Disp: 14 suppository, Rfl: 1 .  ketoconazole (NIZORAL) 2 % cream, Apply 1 application topically daily as needed. , Disp: , Rfl:  .  rosuvastatin (CRESTOR) 5 MG tablet, Take 1 tablet (5 mg total) by mouth daily. (Patient not taking: Reported on 04/03/2017), Disp: 30 tablet, Rfl: 6 .  vitamin B-12 (CYANOCOBALAMIN) 250 MCG tablet, Take 2,500 mcg by mouth daily., Disp: , Rfl:   Current Facility-Administered Medications:  .  polyethylene glycol powder (GLYCOLAX/MIRALAX) container 255 g, 1 Container, Oral, Once, Jonnie Kind, MD   Social History: Reviewed -  reports that she has never smoked. She has never used smokeless tobacco.  Objective Findings:  Vitals: Blood pressure 129/65, pulse 76, height 5' (1.524 m), weight 161 lb (73 kg).  PHYSICAL EXAMINATION General appearance - alert, well appearing, and in no distress and oriented to person, place, and time Mental status - alert, oriented to person, place, and time, normal mood, behavior, speech, dress, motor activity, and thought processes, affect appropriate to mood  PELVIC Vagina - small cystocele, good support of apex of vagina, and good support of urethra Cervix - surgically absent Uterus - surgically absent  Assessment & Plan:   A:  1. Urge incontinence 2. Small Cyctocele  P:  1.  Rx Ditropan 5 bid prn 2. F/u in 1 month    By signing my name below, I, Samul Dada, attest that this documentation has been prepared under the direction and in the presence of Jonnie Kind, MD. Electronically Signed: [place]. 12/27/17. 2:29 PM.  I personally performed the services described in this documentation, which was SCRIBED in my presence. The recorded information has been reviewed and considered accurate. It has been edited as necessary during review. Jonnie Kind, MD

## 2018-01-17 DIAGNOSIS — N390 Urinary tract infection, site not specified: Secondary | ICD-10-CM | POA: Diagnosis not present

## 2018-01-17 DIAGNOSIS — R1033 Periumbilical pain: Secondary | ICD-10-CM | POA: Diagnosis not present

## 2018-01-17 DIAGNOSIS — R350 Frequency of micturition: Secondary | ICD-10-CM | POA: Diagnosis not present

## 2018-01-17 DIAGNOSIS — N342 Other urethritis: Secondary | ICD-10-CM | POA: Diagnosis not present

## 2018-01-17 DIAGNOSIS — M545 Low back pain: Secondary | ICD-10-CM | POA: Diagnosis not present

## 2018-01-24 ENCOUNTER — Ambulatory Visit: Payer: Medicare Other | Admitting: Obstetrics and Gynecology

## 2018-01-31 ENCOUNTER — Other Ambulatory Visit (HOSPITAL_COMMUNITY): Payer: Self-pay | Admitting: Nephrology

## 2018-01-31 DIAGNOSIS — N183 Chronic kidney disease, stage 3 unspecified: Secondary | ICD-10-CM

## 2018-01-31 DIAGNOSIS — R809 Proteinuria, unspecified: Secondary | ICD-10-CM | POA: Diagnosis not present

## 2018-01-31 DIAGNOSIS — E559 Vitamin D deficiency, unspecified: Secondary | ICD-10-CM | POA: Diagnosis not present

## 2018-01-31 DIAGNOSIS — D649 Anemia, unspecified: Secondary | ICD-10-CM | POA: Diagnosis not present

## 2018-01-31 DIAGNOSIS — N184 Chronic kidney disease, stage 4 (severe): Secondary | ICD-10-CM | POA: Diagnosis not present

## 2018-02-14 ENCOUNTER — Other Ambulatory Visit (HOSPITAL_COMMUNITY): Payer: Self-pay | Admitting: Nephrology

## 2018-02-14 ENCOUNTER — Ambulatory Visit (HOSPITAL_COMMUNITY)
Admission: RE | Admit: 2018-02-14 | Discharge: 2018-02-14 | Disposition: A | Discharge status: Home / Self Care | Payer: Medicare Other | Source: Ambulatory Visit | Attending: Nephrology | Admitting: Nephrology

## 2018-02-14 DIAGNOSIS — N183 Chronic kidney disease, stage 3 unspecified: Secondary | ICD-10-CM

## 2018-02-14 DIAGNOSIS — D509 Iron deficiency anemia, unspecified: Secondary | ICD-10-CM | POA: Diagnosis not present

## 2018-02-14 DIAGNOSIS — E559 Vitamin D deficiency, unspecified: Secondary | ICD-10-CM | POA: Diagnosis not present

## 2018-02-14 DIAGNOSIS — Z79899 Other long term (current) drug therapy: Secondary | ICD-10-CM | POA: Diagnosis not present

## 2018-02-14 DIAGNOSIS — I10 Essential (primary) hypertension: Secondary | ICD-10-CM | POA: Diagnosis not present

## 2018-02-14 DIAGNOSIS — Z1159 Encounter for screening for other viral diseases: Secondary | ICD-10-CM | POA: Diagnosis not present

## 2018-02-14 DIAGNOSIS — R809 Proteinuria, unspecified: Secondary | ICD-10-CM | POA: Diagnosis not present

## 2018-02-19 ENCOUNTER — Other Ambulatory Visit: Payer: Self-pay | Admitting: Cardiovascular Disease

## 2018-02-23 DIAGNOSIS — E872 Acidosis: Secondary | ICD-10-CM | POA: Diagnosis not present

## 2018-02-23 DIAGNOSIS — N184 Chronic kidney disease, stage 4 (severe): Secondary | ICD-10-CM | POA: Diagnosis not present

## 2018-02-23 DIAGNOSIS — R809 Proteinuria, unspecified: Secondary | ICD-10-CM | POA: Diagnosis not present

## 2018-02-23 DIAGNOSIS — D638 Anemia in other chronic diseases classified elsewhere: Secondary | ICD-10-CM | POA: Diagnosis not present

## 2018-05-10 DIAGNOSIS — D509 Iron deficiency anemia, unspecified: Secondary | ICD-10-CM | POA: Diagnosis not present

## 2018-05-10 DIAGNOSIS — N183 Chronic kidney disease, stage 3 (moderate): Secondary | ICD-10-CM | POA: Diagnosis not present

## 2018-05-10 DIAGNOSIS — E559 Vitamin D deficiency, unspecified: Secondary | ICD-10-CM | POA: Diagnosis not present

## 2018-05-10 DIAGNOSIS — I10 Essential (primary) hypertension: Secondary | ICD-10-CM | POA: Diagnosis not present

## 2018-05-10 DIAGNOSIS — Z79899 Other long term (current) drug therapy: Secondary | ICD-10-CM | POA: Diagnosis not present

## 2018-05-17 ENCOUNTER — Ambulatory Visit (INDEPENDENT_AMBULATORY_CARE_PROVIDER_SITE_OTHER): Payer: Medicare Other | Admitting: Otolaryngology

## 2018-05-17 DIAGNOSIS — R070 Pain in throat: Secondary | ICD-10-CM | POA: Diagnosis not present

## 2018-05-17 DIAGNOSIS — E872 Acidosis: Secondary | ICD-10-CM | POA: Diagnosis not present

## 2018-05-17 DIAGNOSIS — K219 Gastro-esophageal reflux disease without esophagitis: Secondary | ICD-10-CM | POA: Diagnosis not present

## 2018-05-17 DIAGNOSIS — N184 Chronic kidney disease, stage 4 (severe): Secondary | ICD-10-CM | POA: Diagnosis not present

## 2018-05-17 DIAGNOSIS — R809 Proteinuria, unspecified: Secondary | ICD-10-CM | POA: Diagnosis not present

## 2018-05-17 DIAGNOSIS — D638 Anemia in other chronic diseases classified elsewhere: Secondary | ICD-10-CM | POA: Diagnosis not present

## 2018-06-07 DIAGNOSIS — N342 Other urethritis: Secondary | ICD-10-CM | POA: Diagnosis not present

## 2018-06-07 DIAGNOSIS — R350 Frequency of micturition: Secondary | ICD-10-CM | POA: Diagnosis not present

## 2018-06-07 DIAGNOSIS — Z1389 Encounter for screening for other disorder: Secondary | ICD-10-CM | POA: Diagnosis not present

## 2018-07-16 ENCOUNTER — Other Ambulatory Visit: Payer: Self-pay | Admitting: Cardiovascular Disease

## 2018-07-20 DIAGNOSIS — N184 Chronic kidney disease, stage 4 (severe): Secondary | ICD-10-CM | POA: Diagnosis not present

## 2018-07-20 DIAGNOSIS — R809 Proteinuria, unspecified: Secondary | ICD-10-CM | POA: Diagnosis not present

## 2018-07-20 DIAGNOSIS — I10 Essential (primary) hypertension: Secondary | ICD-10-CM | POA: Diagnosis not present

## 2018-07-20 DIAGNOSIS — Z79899 Other long term (current) drug therapy: Secondary | ICD-10-CM | POA: Diagnosis not present

## 2018-07-20 DIAGNOSIS — D638 Anemia in other chronic diseases classified elsewhere: Secondary | ICD-10-CM | POA: Diagnosis not present

## 2018-07-26 ENCOUNTER — Telehealth: Payer: Self-pay | Admitting: Cardiovascular Disease

## 2018-07-26 NOTE — Telephone Encounter (Signed)
I called but was unable to reach the patient.  I left her a voicemail regarding rescheduling her appointment if her cardiac symptoms are stable, given the [place] pandemic.  I asked her to give our office a call back.

## 2018-07-26 NOTE — Telephone Encounter (Signed)
I called and spoke with the patient.  While she has been having some mild intermittent symptoms, they have been fairly stable overall for the last several months.  I have recommended that her appointment be rescheduled to 3 to 4 weeks from now given the [place] pandemic. She is agreeable.

## 2018-07-27 NOTE — Telephone Encounter (Signed)
Office visit has been rescheduled for 08/24/2018.

## 2018-07-31 ENCOUNTER — Ambulatory Visit: Payer: Medicare Other | Admitting: Cardiovascular Disease

## 2018-08-15 ENCOUNTER — Telehealth: Payer: Self-pay | Admitting: *Deleted

## 2018-08-15 NOTE — Telephone Encounter (Signed)
TELEPHONE CALL NOTE  [name] has been deemed a candidate for a follow-up tele-health visit to limit community exposure during the Covid-19 pandemic. I spoke with the patient via phone to ensure availability of phone/video source, confirm preferred email & phone number, and discuss instructions and expectations.  I reminded [name] to be prepared with any vital sign and/or heart rhythm information that could potentially be obtained via home monitoring, at the time of her visit. I reminded [name] to expect a phone call at the time of her visit if her visit.  Did the patient verbally acknowledge consent to treatment? Yes  Lawrence Santiago, LPN 11/08/7104 2:69 PM   DOWNLOADING THE [place], go to [employer] and type in WebEx in the search bar. [place] [employer], the blue/green circle. The app is free but as with any other app downloads, their phone may require them to verify saved payment information or Apple password. The patient does NOT have to create an account.  - If Android, ask patient to go to [employer] and type in WebEx in the search bar. [place] [employer], the blue/green circle. The app is free but as with any other app downloads, their phone may require them to verify saved payment information or Android password. The patient does NOT have to create an account.   CONSENT FOR TELE-HEALTH VISIT - PLEASE REVIEW  I hereby voluntarily request, consent and authorize [facility] and its employed or contracted physicians, physician assistants, nurse practitioners or other licensed health care professionals (the Practitioner), to provide me with telemedicine health care services (the "Services") as deemed necessary by the treating Practitioner. I acknowledge and consent to receive the Services by the Practitioner via telemedicine. I understand that the telemedicine visit will involve communicating with the  Practitioner through live audiovisual communication technology and the disclosure of certain medical information by electronic transmission. I acknowledge that I have been given the opportunity to request an in-person assessment or other available alternative prior to the telemedicine visit and am voluntarily participating in the telemedicine visit.  I understand that I have the right to withhold or withdraw my consent to the use of telemedicine in the course of my care at any time, without affecting my right to future care or treatment, and that the Practitioner or I may terminate the telemedicine visit at any time. I understand that I have the right to inspect all information obtained and/or recorded in the course of the telemedicine visit and may receive copies of available information for a reasonable fee.  I understand that some of the potential risks of receiving the Services via telemedicine include:  Marland Kitchen Delay or interruption in medical evaluation due to technological equipment failure or disruption; . Information transmitted may not be sufficient (e.g. poor resolution of images) to allow for appropriate medical decision making by the Practitioner; and/or  . In rare instances, security protocols could fail, causing a breach of personal health information.  Furthermore, I acknowledge that it is my responsibility to provide information about my medical history, conditions and care that is complete and accurate to the best of my ability. I acknowledge that Practitioner's advice, recommendations, and/or decision may be based on factors not within their control, such as incomplete or inaccurate data provided by me or distortions of diagnostic images or specimens that may result from electronic transmissions. I understand that the practice of medicine  is not an Chief Strategy Officer and that Practitioner makes no warranties or guarantees regarding treatment outcomes. I acknowledge that I will receive a copy of this  consent concurrently upon execution via email to the email address I last provided but may also request a printed copy by calling the office of [facility].    I understand that my insurance will be billed for this visit.   I have read or had this consent read to me. . I understand the contents of this consent, which adequately explains the benefits and risks of the Services being provided via telemedicine.  . I have been provided ample opportunity to ask questions regarding this consent and the Services and have had my questions answered to my satisfaction. . I give my informed consent for the services to be provided through the use of telemedicine in my medical care  By participating in this telemedicine visit I agree to the above.

## 2018-08-23 ENCOUNTER — Telehealth: Payer: Self-pay | Admitting: *Deleted

## 2018-08-23 ENCOUNTER — Telehealth: Payer: Self-pay | Admitting: Cardiovascular Disease

## 2018-08-23 NOTE — Telephone Encounter (Signed)
Spoke with pt, meds reviewed

## 2018-08-23 NOTE — Telephone Encounter (Signed)
Called patient to complete her pre reg for her apt tomorrow w/ R. Barrett, pt requested to cancel the apt till she's able to come into the office for a physical exam with the Dr. Abbott Pao stated she wanted to wait till she could have a stress test done, I asked her if she was having some problems, pt stated that she was and that she got a little short winded when she would walk.   Pt was fine w/ having a f/u in August w/ SK, did not request a call from the nurse,  just wanted to inform someone that she did state she was having some problems.

## 2018-08-23 NOTE — Telephone Encounter (Signed)
I spoke with patient, she gets chest tightness when she walkj up the hill to her mail box. I encouraged her to take her phone apt tomorrow, she agreed

## 2018-08-24 ENCOUNTER — Telehealth: Payer: Medicare Other | Admitting: Physician Assistant

## 2018-08-24 ENCOUNTER — Other Ambulatory Visit: Payer: Self-pay | Admitting: Physician Assistant

## 2018-08-24 ENCOUNTER — Telehealth (INDEPENDENT_AMBULATORY_CARE_PROVIDER_SITE_OTHER): Payer: Medicare Other | Admitting: Physician Assistant

## 2018-08-24 ENCOUNTER — Encounter: Payer: Self-pay | Admitting: *Deleted

## 2018-08-24 ENCOUNTER — Telehealth: Payer: Self-pay | Admitting: Physician Assistant

## 2018-08-24 VITALS — BP 148/67 | HR 62 | Wt 165.0 lb

## 2018-08-24 DIAGNOSIS — I10 Essential (primary) hypertension: Secondary | ICD-10-CM

## 2018-08-24 DIAGNOSIS — R079 Chest pain, unspecified: Secondary | ICD-10-CM

## 2018-08-24 DIAGNOSIS — I25119 Atherosclerotic heart disease of native coronary artery with unspecified angina pectoris: Secondary | ICD-10-CM

## 2018-08-24 DIAGNOSIS — E785 Hyperlipidemia, unspecified: Secondary | ICD-10-CM

## 2018-08-24 MED ORDER — ROSUVASTATIN CALCIUM 5 MG PO TABS: 5.0000 mg | ORAL_TABLET | Freq: Every day | ORAL | 11 refills | Status: DC

## 2018-08-24 MED ORDER — NITROGLYCERIN 0.4 MG SL SUBL: 0.4000 mg | SUBLINGUAL_TABLET | SUBLINGUAL | 3 refills | Status: DC | PRN

## 2018-08-24 MED ORDER — METOPROLOL SUCCINATE ER 25 MG PO TB24: 25.0000 mg | ORAL_TABLET | Freq: Every day | ORAL | 11 refills | Status: DC

## 2018-08-24 NOTE — Patient Instructions (Signed)
Medication Instructions:  Stop Taking Coreg  Start Taking Toprol XL 25 mg Daily   Start Taking Nitro as needed for chest pain.  Start Crestor 5 mg Daily   Continue Norvasc 5 mg Daily   Labwork: NONE  Testing/Procedures: Your physician has requested that you have a lexiscan myoview. For further information please visit [email or web address]. Please follow instruction sheet, as given.   Follow-Up: To Be Determined   Any Other Special Instructions Will Be Listed Below (If Applicable).     If you need a refill on your cardiac medications before your next appointment, please call your pharmacy.  Thank you for choosing [facility]!

## 2018-08-24 NOTE — Progress Notes (Signed)
Virtual Visit via Telephone Note   This visit type was conducted due to national recommendations for restrictions regarding the COVID-19 Pandemic (e.g. social distancing) in an effort to limit this patient's exposure and mitigate transmission in our community.  Due to her co-morbid illnesses, this patient is at least at moderate risk for complications without adequate follow up.  This format is felt to be most appropriate for this patient at this time.  The patient did not have access to video technology/had technical difficulties with video requiring transitioning to audio format only (telephone).  All issues noted in this document were discussed and addressed.  No physical exam could be performed with this format.  Please refer to the patient's chart for her  consent to telehealth for [facility].  Evaluation Performed:  Follow-up visit  This visit type was conducted due to national recommendations for restrictions regarding the COVID-19 Pandemic (e.g. social distancing).  This format is felt to be most appropriate for this patient at this time.  All issues noted in this document were discussed and addressed.  No physical exam was performed (except for noted visual exam findings with Video Visits).  Please refer to the patient's chart (MyChart message for video visits and phone note for telephone visits) for the patient's consent to telehealth for [facility]  Date:  08/24/2018   ID:  [name], DOB [birth year], MRN [id number]  Patient Location:  Home  Provider location:   Off-site  PCP:  Sharilyn Sites, MD  Cardiologist:  Kate Sable, MD 07/31/2017 Electrophysiologist:  None   Chief Complaint: Chest pain.  History of Present Illness:    [name] is a 85 y.o. female who presents via audio/video conferencing for a telehealth visit today.    85 y.o. yo female who has a hx of HSRA & DES LAD 2012, HTN, HLD, OA, CKD III-IV, anemia  Has nocturia multiple times  per night, admits that she drinks a lot of water in the evening.  Does not wake with lower extremity edema, denies orthopnea or PND.  She will feel her heart flutter when she lays back down after getting up to BR. It is brief and will resolve in just a minute or two.   When she walks on flat ground, she does ok, but will get CP, tightness, 2/10, and SOB when she walks up hills. It resolves w/ rest. She is concerned about this, because it reminds her of the symptoms she had before she got the stent.    She had taken her blood pressure this morning, and it was 148/67.  She takes a half of a carvedilol 25 mg tablet in the morning and does not take the evening dose.  In the evening, she takes half of amlodipine 10 mg.  She has been doing this for a while.  On these doses of medications, she feels like her blood pressure is pretty good.  She is aware that her blood pressure is above target, but states that if her blood pressure drops any lower, she will get lightheaded and dizzy.  That is why she has cut back on the medications.  Her PCP does her labs, she has not had them done in a while.  Her last LDL was 101.  She has rosuvastatin 5 mg tablets at home, but has not been taking them.  The patient does not have symptoms concerning for COVID-19 infection (fever, chills, cough, or new shortness of breath).  Has a cough, gets it every morning this time of year. Coughs up some phlegm before she gets out of bed, does not cough at other time.    Prior CV studies:   The following studies were reviewed today:  MYOVIEW: 01/21/2015 Nuclear stress test on 01/21/15 showed no evidence of myocardial ischemia or scar, LVEF 53%.  ECHO: 01/21/2015 - Left ventricle: The cavity size was normal. Wall thickness was   increased in a pattern of mild LVH. Systolic function was normal.   The estimated ejection fraction was in the range of 55% to 60%.   Wall motion was normal; there were no regional wall motion    abnormalities. Doppler parameters are consistent with abnormal   left ventricular relaxation (grade 1 diastolic dysfunction). - Ventricular septum: Septal motion showed abnormal function and   dyssynergy. - Aortic valve: Mildly calcified annulus. Trileaflet. - Mitral valve: Mildly thickened leaflets . There was trivial   regurgitation. - Left atrium: The atrium was mildly dilated. - Right atrium: Central venous pressure (est): 3 mm Hg. - Atrial septum: No defect or patent foramen ovale was identified. - Tricuspid valve: There was physiologic regurgitation. - Pulmonary arteries: Systolic pressure could not be accurately   estimated. - Pericardium, extracardiac: A small pericardial effusion was   identified circumferential to the heart.  Impressions:  - Mild LVH with LVEF 55-60% and grade 1 diastolic dysfunction.   Septal dyssynergy consistent with conduction abnormality. Mild   left atrial enlargement. Mildly sclerotic mitral and aortic   annulus. Small circumferential pericardial effusion noted.   PCI LAD: 11/09/2010 IMPRESSION: 1. Difficult but successful complex percutaneous coronary prevention     of calcified proximal left anterior descending stenosis with     ultimate high-speed rotational atherectomy, percutaneous     transluminal coronary angioplasty/stenting with a 3.0 x 28 mm     Promus Element drug eluting stent postdilated to 3.38 mm. 2. Temporary pacemaker insertion. 3. IV nitroglycerin, 600 mg Plavix, bivalirudin as well as IC     nitroglycerin.  CARDIAC CATH: 11/05/2010 ANGIOGRAPHIC DATA:  Left main coronary was an upward takeoff vessel that trifurcated into an LAD, ramus intermediate vessel, and left circumflex system.  The LAD was significantly calcified in its proximal segment extending to the mid segment.  There was 30% narrowing proximal to the septal perforating artery and diagonal vessel with eccentric calcified 80% stenosis after the diagonal  vessel.  The remainder of the LAD was free of significant disease and extended around the apex.  The ramus intermediate vessel had diffuse proximal 30% narrowing.  The circumflex vessel gave rise to one proximal marginal vessel and ended in an inferior LV-like branch extending to the apex.  The right coronary artery was a large calcified vessel that had minimal luminal irregularity proximally.  There was 30% narrowing in the ostium of the PDA.  IMPRESSION: 1. Systemic hypertension. 2. Diffuse coronary calcification with 30-80% progressive calcified     stenosis in the left anterior descending artery proximally at the     region of the septal and diagonal takeoff.    Past Medical History:  Diagnosis Date  . Anemia   . Anxiety   . Arthritis   . Bleeding from the nose Oct 2014  . CAD (coronary artery disease)   . Cataract of both eyes   . Chronic kidney disease   . Colon polyps   . Hyperlipidemia   . Hypertension    x 5-6 yrs.  Marland Kitchen LBBB (left bundle branch  block)    Past Surgical History:  Procedure Laterality Date  . BREAST SURGERY     masses removed while she was pregnant  . CARDIAC CATHETERIZATION  06/20/2008   severe single vessel high-grade stenosis of mid LAD at bifurcation of diagonal 1 and setpal perforator 1; diffuse coronary calcification of L coronary system; 50% stenosis of mid RCA (Dr. Jackie Plum)  . CATARACT EXTRACTION W/PHACO Right 10/10/2013   Procedure: CATARACT EXTRACTION PHACO AND INTRAOCULAR LENS PLACEMENT (IOC);  Surgeon: Tonny Branch, MD;  Location: [facility];  Service: Ophthalmology;  Laterality: Right;  CDE:11.96  . CATARACT EXTRACTION W/PHACO Left 11/18/2013   Procedure: CATARACT EXTRACTION PHACO AND INTRAOCULAR LENS PLACEMENT (IOC);  Surgeon: Tonny Branch, MD;  Location: [facility];  Service: Ophthalmology;  Laterality: Left;  CDE 15.96  . COLONOSCOPY N/A 01/16/2013   Procedure: COLONOSCOPY;  Surgeon: Rogene Houston, MD;  [facility]: [facility];  Service:  Endoscopy;  Laterality: N/A;  1200  . CORONARY ANGIOPLASTY WITH STENT PLACEMENT  11/08/2010   complex high-speed rotational atherectomy of calcified LAD, diffusely disease LAD system (Dr. Corky Downs)  . EXCISION ORAL TUMOR Right 01/14/2014   Procedure: EXCISION OROPHARYNGEAL MASS;  Surgeon: Ascencion Dike, MD;  Location: [place];  Service: ENT;  Laterality: Right;  . NM MYOCAR PERF WALL MOTION  05/2011   lexiscan myoview; normal pattern of perfusion in all regions; post-stress EF 55%; low risk scan   . PARTIAL HYSTERECTOMY    . TONSILLECTOMY    . TRANSTHORACIC ECHOCARDIOGRAM  10/2010   EF 45-50%, mod conc LVH, grade 1 diastolic dysfunction, mildly calcified left AV cusp; calcified MV annulus     Current Meds  Medication Sig  . aspirin 81 MG tablet Take 81 mg by mouth daily.   . carvedilol (COREG) 25 MG tablet Take 12.5 mg by mouth 2 (two) times daily with a meal.  . FLAXSEED, LINSEED, PO Take 1 capsule by mouth daily.  . isosorbide mononitrate (IMDUR) 120 MG 24 hr tablet Take 1 tablet by mouth once daily  . Multiple Vitamin (MULTIVITAMIN) tablet Take 1 tablet by mouth daily.  . Vitamin D, Ergocalciferol, (DRISDOL) 50000 units CAPS capsule TAKE 1 CAPSULE BY MOUTH ONCE A WEEK PT TO TAKE 2000 UNITS OF VITAMIN D OVER THE COUNTER DAILY AFTER THIS RX IS FINISHED  . [DISCONTINUED] amLODipine (NORVASC) 10 MG tablet Take 1 tablet (10 mg total) by mouth daily.   Current Facility-Administered Medications for the 08/24/18 encounter (Telemedicine) with Laycee Fitzsimmons, Evelene Croon, PA-C  Medication  . polyethylene glycol powder (GLYCOLAX/MIRALAX) container 255 g     Allergies:   Patient has no known allergies.   Social History   Tobacco Use  . Smoking status: Never Smoker  . Smokeless tobacco: Never Used  Substance Use Topics  . Alcohol use: No  . Drug use: No     Family Hx: The patient's family history includes COPD in her daughter; Cancer in her father; Coronary artery disease in her sister;  Heart Problems in her brother and daughter; Liver cancer in her mother. There is no history of Colon cancer.  ROS:   Please see the history of present illness.    All other systems reviewed and are negative.   Labs/Other Tests and Data Reviewed:    Recent Labs:  Cholesterol, total 205.000 m 10/17/2017 HDL 47.000 mg 10/17/2017 LDL 101.000 m 10/17/2017 Triglycerides 287.000 m 10/17/2017 A1C 5.500 % 02/14/2018 Hemoglobin 10.200 G/ 07/20/2018 Creatinine, Serum 2.220 MG/ 07/20/2018 Potassium 5.000  mm 10/17/2017 Magnesium N/D ALT (SGPT) 7.000 IU/L 10/17/2017 TSH 2.210 10/17/2017 BNP N/D INR N/D Platelets 162.000 X 10/17/2017  Recent Lipid Panel Lab Results  Component Value Date/Time   CHOL 164 05/13/2014 08:46 AM   TRIG 118 05/13/2014 08:46 AM   HDL 46 05/13/2014 08:46 AM   CHOLHDL 3.6 05/13/2014 08:46 AM   LDLCALC 94 05/13/2014 08:46 AM    Wt Readings from Last 3 Encounters:  08/24/18 165 lb (74.8 kg)  12/27/17 161 lb (73 kg)  07/31/17 160 lb (72.6 kg)     Objective:    Vital Signs:  BP (!) 148/67   Pulse 62   Wt 165 lb (74.8 kg)   BMI 32.22 kg/m    85 y.o. female in no acute distress over the phone.   ASSESSMENT & PLAN:    1.  Chest pain, high risk for coronary etiology: She is having consistent exertional angina.  She is already on baby aspirin, beta-blocker, and high-dose Imdur. -If she does not feel like she can tolerate taking the carvedilol twice a day, I will switch her to Toprol-XL 25 mg daily. - She is concerned about the chest pain, that she is building up a blockage in her arteries. - She has not had a stress test in 4 years.  I will get a [employer].  Because of her poor renal function, she is reluctant to do dye but cardiac catheterization with minimal dye may need discussing if the stress test is significantly abnormal  2.  Hypertension: She says she feels better if her blood pressure is at target. - Permissive hypertension, goal blood pressure 140-150.  -I strongly encouraged her to take the carvedilol twice a day or change to another beta-blocker.  She has a large bottle of the carvedilol at home so she is going to try that first.  3.  Hyperlipidemia: - I explained that her goal LDL was less than 70. -She has Crestor 5 mg tablets at home and feels that she tolerated this.  She was reassured that it will not affect her kidney function. -She will start back taking the Crestor 5 mg daily. -Follow-up with PCP for labs.  4.  Seasonal allergies: She feels that her symptoms are no different from what she normally has this time of year.  She is encouraged to contact her PCP if she develops fever or if her cough becomes any worse than usual.  COVID-19 Education: The signs and symptoms of COVID-19 were discussed with the patient and how to seek care for testing (follow up with PCP or arrange E-visit).  The importance of social distancing was discussed today.  Patient Risk:   After full review of this patient's clinical status, I feel that they are at least moderate risk at this time.  Time:   Today, I have spent 20 minutes with the patient with telehealth technology discussing cardiology and other health issues.     Medication Adjustments/Labs and Tests Ordered: Current medicines are reviewed at length with the patient today.  Concerns regarding medicines are outlined above.  Tests Ordered: No orders of the defined types were placed in this encounter.  Medication Changes:   Disposition:  Follow up With Dr. Bronson Ing after the Myoview  Signed, Rosaria Ferries, PA-C  08/24/2018 4:45 PM    [place]

## 2018-08-24 NOTE — Telephone Encounter (Signed)
°  Precert needed for:  lexiscan    Location: [facility]    Date: August 29, 2018

## 2018-08-28 NOTE — Addendum Note (Signed)
Addended by: Levonne Hubert on: 08/28/2018 09:45 AM   Modules accepted: Orders

## 2018-08-29 ENCOUNTER — Encounter (HOSPITAL_COMMUNITY)
Admission: RE | Admit: 2018-08-29 | Discharge: 2018-08-29 | Disposition: A | Discharge status: Home / Self Care | Payer: Medicare Other | Source: Ambulatory Visit | Attending: Physician Assistant | Admitting: Physician Assistant

## 2018-08-29 ENCOUNTER — Encounter (HOSPITAL_COMMUNITY): Payer: Self-pay

## 2018-08-29 ENCOUNTER — Other Ambulatory Visit: Payer: Self-pay

## 2018-08-29 ENCOUNTER — Ambulatory Visit (HOSPITAL_COMMUNITY)
Admission: RE | Admit: 2018-08-29 | Discharge: 2018-08-29 | Disposition: A | Discharge status: Home / Self Care | Payer: Medicare Other | Source: Ambulatory Visit | Attending: Physician Assistant | Admitting: Physician Assistant

## 2018-08-29 DIAGNOSIS — R079 Chest pain, unspecified: Secondary | ICD-10-CM | POA: Insufficient documentation

## 2018-08-29 DIAGNOSIS — I25119 Atherosclerotic heart disease of native coronary artery with unspecified angina pectoris: Secondary | ICD-10-CM | POA: Insufficient documentation

## 2018-08-29 HISTORY — DX: Disorder of kidney and ureter, unspecified: N28.9

## 2018-08-29 LAB — NM MYOCAR MULTI W/SPECT W/WALL MOTION / EF
LV dias vol: 81 mL (ref 46–106)
LV sys vol: 32 mL
Peak HR: 96 {beats}/min
RATE: 0.42
Rest HR: 66 {beats}/min
SDS: 1
SRS: 3
SSS: 4
TID: 0.85

## 2018-08-29 MED ORDER — TECHNETIUM TC 99M TETROFOSMIN IV KIT
30.0000 | PACK | Freq: Once | INTRAVENOUS | Status: AC | PRN
  Administered 2018-08-29: 32 via INTRAVENOUS

## 2018-08-29 MED ORDER — SODIUM CHLORIDE 0.9% FLUSH
INTRAVENOUS | Status: AC
  Administered 2018-08-29: 10 mL via INTRAVENOUS
  Filled 2018-08-29: qty 10

## 2018-08-29 MED ORDER — REGADENOSON 0.4 MG/5ML IV SOLN
INTRAVENOUS | Status: AC
  Administered 2018-08-29: 0.4 mg via INTRAVENOUS
  Filled 2018-08-29: qty 5

## 2018-08-29 MED ORDER — TECHNETIUM TC 99M TETROFOSMIN IV KIT
10.0000 | PACK | Freq: Once | INTRAVENOUS | Status: AC | PRN
  Administered 2018-08-29: 10:00:00 10.4 via INTRAVENOUS

## 2018-09-19 DIAGNOSIS — Z0001 Encounter for general adult medical examination with abnormal findings: Secondary | ICD-10-CM | POA: Diagnosis not present

## 2018-09-19 DIAGNOSIS — N184 Chronic kidney disease, stage 4 (severe): Secondary | ICD-10-CM | POA: Diagnosis not present

## 2018-09-19 DIAGNOSIS — D510 Vitamin B12 deficiency anemia due to intrinsic factor deficiency: Secondary | ICD-10-CM | POA: Diagnosis not present

## 2018-09-19 DIAGNOSIS — Z1389 Encounter for screening for other disorder: Secondary | ICD-10-CM | POA: Diagnosis not present

## 2018-09-19 DIAGNOSIS — I2510 Atherosclerotic heart disease of native coronary artery without angina pectoris: Secondary | ICD-10-CM | POA: Diagnosis not present

## 2018-10-18 DIAGNOSIS — N184 Chronic kidney disease, stage 4 (severe): Secondary | ICD-10-CM | POA: Diagnosis not present

## 2018-10-18 DIAGNOSIS — I2510 Atherosclerotic heart disease of native coronary artery without angina pectoris: Secondary | ICD-10-CM | POA: Diagnosis not present

## 2018-10-18 DIAGNOSIS — I10 Essential (primary) hypertension: Secondary | ICD-10-CM | POA: Diagnosis not present

## 2018-10-19 DIAGNOSIS — E785 Hyperlipidemia, unspecified: Secondary | ICD-10-CM | POA: Diagnosis not present

## 2018-10-19 DIAGNOSIS — R7309 Other abnormal glucose: Secondary | ICD-10-CM | POA: Diagnosis not present

## 2018-10-19 DIAGNOSIS — D510 Vitamin B12 deficiency anemia due to intrinsic factor deficiency: Secondary | ICD-10-CM | POA: Diagnosis not present

## 2018-10-19 DIAGNOSIS — I2510 Atherosclerotic heart disease of native coronary artery without angina pectoris: Secondary | ICD-10-CM | POA: Diagnosis not present

## 2018-11-15 ENCOUNTER — Other Ambulatory Visit: Payer: Self-pay | Admitting: Cardiovascular Disease

## 2018-11-29 DIAGNOSIS — N183 Chronic kidney disease, stage 3 (moderate): Secondary | ICD-10-CM | POA: Diagnosis not present

## 2018-12-17 ENCOUNTER — Encounter: Payer: Self-pay | Admitting: Cardiovascular Disease

## 2018-12-17 ENCOUNTER — Encounter (INDEPENDENT_AMBULATORY_CARE_PROVIDER_SITE_OTHER): Payer: Self-pay

## 2018-12-17 ENCOUNTER — Ambulatory Visit (INDEPENDENT_AMBULATORY_CARE_PROVIDER_SITE_OTHER): Payer: Medicare Other | Admitting: Cardiovascular Disease

## 2018-12-17 ENCOUNTER — Other Ambulatory Visit: Payer: Self-pay

## 2018-12-17 VITALS — BP 163/70 | HR 70 | Temp 97.5°F | Ht 61.5 in | Wt 159.0 lb

## 2018-12-17 DIAGNOSIS — I25118 Atherosclerotic heart disease of native coronary artery with other forms of angina pectoris: Secondary | ICD-10-CM

## 2018-12-17 DIAGNOSIS — I10 Essential (primary) hypertension: Secondary | ICD-10-CM | POA: Diagnosis not present

## 2018-12-17 DIAGNOSIS — I447 Left bundle-branch block, unspecified: Secondary | ICD-10-CM

## 2018-12-17 DIAGNOSIS — Z955 Presence of coronary angioplasty implant and graft: Secondary | ICD-10-CM

## 2018-12-17 DIAGNOSIS — E785 Hyperlipidemia, unspecified: Secondary | ICD-10-CM

## 2018-12-17 MED ORDER — ROSUVASTATIN CALCIUM 5 MG PO TABS: 5.0000 mg | ORAL_TABLET | Freq: Every day | ORAL | 3 refills | Status: DC

## 2018-12-17 NOTE — Patient Instructions (Signed)
Medication Instructions: RE-Start Crestor 5 mg daily at dinner   Labwork: None  Procedures/Testing: None  Follow-Up: 6 months with Dr.Koneswaran  Any Additional Special Instructions Will Be Listed Below (If Applicable).     If you need a refill on your cardiac medications before your next appointment, please call your pharmacy.      Thank you for choosing [facility] !

## 2018-12-17 NOTE — Progress Notes (Signed)
SUBJECTIVE: The patient presents for routine follow-up. She has a history of coronary artery disease and left bundle branch block. She previously underwent PCI in July 2012 of the LAD.  She underwent a low risk nuclear stress test on 08/29/2018, EF 60%.  Echocardiogram 01/21/15 demonstrated normal left ventricular systolic function, LVEF 87-56%, mild LVH with grade 1 diastolic dysfunction. There was a small circumferential pericardial effusion noted.  She denies exertional chest pain.  Sometimes when she walks uphill to her mailbox he may have some mild chest tightness and some lower back pain but this has been improving.  She denies exertional dyspnea.  She denies leg swelling, orthopnea, paroxysmal nocturnal dyspnea.  She has feet cramps which can wake her up at night.  She wonders if she has restless leg syndrome.  She has been married to her present husband for 3-1/2 years.  He has COPD and also has liver cancer.  Review of Systems: As per "subjective", otherwise negative.  No Known Allergies  Current Outpatient Medications  Medication Sig Dispense Refill  . amLODipine (NORVASC) 10 MG tablet Take 5 mg by mouth daily.    Marland Kitchen aspirin 81 MG tablet Take 81 mg by mouth daily.     Marland Kitchen FLAXSEED, LINSEED, PO Take 1 capsule by mouth daily.    . furosemide (LASIX) 40 MG tablet Take 40 mg by mouth as needed.     . isosorbide mononitrate (IMDUR) 120 MG 24 hr tablet Take 1 tablet by mouth once daily 90 tablet 0  . metoprolol succinate (TOPROL-XL) 25 MG 24 hr tablet Take 1 tablet (25 mg total) by mouth daily. Take with or immediately following a meal. (Patient taking differently: Take 12.5 mg by mouth 2 (two) times daily. Take with or immediately following a meal.) 30 tablet 11  . Multiple Vitamin (MULTIVITAMIN) tablet Take 1 tablet by mouth daily.    . nitroGLYCERIN (NITROSTAT) 0.4 MG SL tablet Place 1 tablet (0.4 mg total) under the tongue every 5 (five) minutes as needed for chest pain. 25 tablet  3  . Vitamin D, Ergocalciferol, (DRISDOL) 50000 units CAPS capsule TAKE 1 CAPSULE BY MOUTH ONCE A WEEK PT TO TAKE 2000 UNITS OF VITAMIN D OVER THE COUNTER DAILY AFTER THIS RX IS FINISHED  0   Current Facility-Administered Medications  Medication Dose Route Frequency Provider Last Rate Last Dose  . polyethylene glycol powder (GLYCOLAX/MIRALAX) container 255 g  1 Container Oral Once Jonnie Kind, MD        Past Medical History:  Diagnosis Date  . Anemia   . Anxiety   . Arthritis   . Bleeding from the nose Oct 2014  . CAD (coronary artery disease)   . Cataract of both eyes   . Chronic kidney disease   . Colon polyps   . Hyperlipidemia   . Hypertension    x 5-6 yrs.  Marland Kitchen LBBB (left bundle branch block)   . Renal insufficiency     Past Surgical History:  Procedure Laterality Date  . BREAST SURGERY     masses removed while she was pregnant  . CARDIAC CATHETERIZATION  06/20/2008   severe single vessel high-grade stenosis of mid LAD at bifurcation of diagonal 1 and setpal perforator 1; diffuse coronary calcification of L coronary system; 50% stenosis of mid RCA (Dr. Jackie Plum)  . CATARACT EXTRACTION W/PHACO Right 10/10/2013   Procedure: CATARACT EXTRACTION PHACO AND INTRAOCULAR LENS PLACEMENT (IOC);  Surgeon: Tonny Branch, MD;  Location: [facility];  Service: Ophthalmology;  Laterality: Right;  CDE:11.96  . CATARACT EXTRACTION W/PHACO Left 11/18/2013   Procedure: CATARACT EXTRACTION PHACO AND INTRAOCULAR LENS PLACEMENT (IOC);  Surgeon: Tonny Branch, MD;  Location: [facility];  Service: Ophthalmology;  Laterality: Left;  CDE 15.96  . COLONOSCOPY N/A 01/16/2013   Procedure: COLONOSCOPY;  Surgeon: Rogene Houston, MD;  [facility];  Service: Endoscopy;  Laterality: N/A;  1200  . CORONARY ANGIOPLASTY WITH STENT PLACEMENT  11/08/2010   complex high-speed rotational atherectomy of calcified LAD, diffusely disease LAD system (Dr. Corky Downs)  . EXCISION ORAL TUMOR Right 01/14/2014   Procedure:  EXCISION OROPHARYNGEAL MASS;  Surgeon: Ascencion Dike, MD;  Location: [facility];  Service: ENT;  Laterality: Right;  . NM MYOCAR PERF WALL MOTION  05/2011   lexiscan myoview; normal pattern of perfusion in all regions; post-stress EF 55%; low risk scan   . PARTIAL HYSTERECTOMY    . TONSILLECTOMY    . TRANSTHORACIC ECHOCARDIOGRAM  10/2010   EF 45-50%, mod conc LVH, grade 1 diastolic dysfunction, mildly calcified left AV cusp; calcified MV annulus    Social History   Socioeconomic History  . Marital status: Married    Spouse name: Not on file  . Number of children: 4  . Years of education: 8  . Highest education level: Not on file  Occupational History  . Not on file  Social Needs  . Financial resource strain: Not on file  . Food insecurity    Worry: Not on file    Inability: Not on file  . Transportation needs    Medical: Not on file    Non-medical: Not on file  Tobacco Use  . Smoking status: Never Smoker  . Smokeless tobacco: Never Used  Substance and Sexual Activity  . Alcohol use: No  . Drug use: No  . Sexual activity: Yes    Birth control/protection: Surgical  Lifestyle  . Physical activity    Days per week: Not on file    Minutes per session: Not on file  . Stress: Not on file  Relationships  . Social Herbalist on phone: Not on file    Gets together: Not on file    Attends religious service: Not on file    Active member of club or organization: Not on file    Attends meetings of clubs or organizations: Not on file    Relationship status: Not on file  . Intimate partner violence    Fear of current or ex partner: Not on file    Emotionally abused: Not on file    Physically abused: Not on file    Forced sexual activity: Not on file  Other Topics Concern  . Not on file  Social History Narrative  . Not on file     Vitals:   12/17/18 1327  BP: (!) 163/70  Pulse: 70  Temp: (!) 97.5 F (36.4 C)  Weight: 159 lb (72.1 kg)  Height: 5'  1.5" (1.562 m)    Wt Readings from Last 3 Encounters:  12/17/18 159 lb (72.1 kg)  08/24/18 165 lb (74.8 kg)  12/27/17 161 lb (73 kg)     PHYSICAL EXAM General: NAD HEENT: Normal. Neck: No JVD, no thyromegaly. Lungs: Clear to auscultation bilaterally with normal respiratory effort. CV: Regular rate and rhythm, normal S1/S2, no S3/S4, no murmur. No pretibial or periankle edema.  No carotid bruit.   Abdomen: Soft, nontender, no distention.  Neurologic: Alert  and oriented.  Psych: Normal affect. Skin: Normal. Musculoskeletal: No gross deformities.    ECG: Reviewed above under Subjective   Labs: Lab Results  Component Value Date/Time   K 4.3 05/13/2014 08:46 AM   BUN 25 (H) 05/13/2014 08:46 AM   CREATININE 2.30 (H) 12/19/2016 01:13 PM   CREATININE 1.49 (H) 05/13/2014 08:46 AM   ALT 20 05/13/2014 08:46 AM   TSH 2.162 05/13/2014 08:46 AM   HGB 11.6 (L) 05/13/2014 08:46 AM     Lipids: Lab Results  Component Value Date/Time   LDLCALC 94 05/13/2014 08:46 AM   CHOL 164 05/13/2014 08:46 AM   TRIG 118 05/13/2014 08:46 AM   HDL 46 05/13/2014 08:46 AM       ASSESSMENT AND PLAN: 1.  Coronary artery disease: Continue aspirin, Toprol-XL, and Imdur.  I recommended she resume Crestor 5 mg.  2.  Hypertension: Blood pressure is elevated.  She said she does not tolerate her systolic blood pressure getting lower than 140-150.  No changes to therapy.  3.  Hyperlipidemia: She had been on Crestor 5 mg daily.  I recommended she resume this.   Disposition: Follow up 6 months   Kate Sable, M.D., F.A.C.C.

## 2018-12-27 DIAGNOSIS — N183 Chronic kidney disease, stage 3 (moderate): Secondary | ICD-10-CM | POA: Diagnosis not present

## 2018-12-27 DIAGNOSIS — R300 Dysuria: Secondary | ICD-10-CM | POA: Diagnosis not present

## 2018-12-27 DIAGNOSIS — I129 Hypertensive chronic kidney disease with stage 1 through stage 4 chronic kidney disease, or unspecified chronic kidney disease: Secondary | ICD-10-CM | POA: Diagnosis not present

## 2018-12-27 DIAGNOSIS — I2510 Atherosclerotic heart disease of native coronary artery without angina pectoris: Secondary | ICD-10-CM | POA: Diagnosis not present

## 2019-01-03 ENCOUNTER — Ambulatory Visit (INDEPENDENT_AMBULATORY_CARE_PROVIDER_SITE_OTHER): Payer: Medicare Other | Admitting: Otolaryngology

## 2019-01-03 DIAGNOSIS — K219 Gastro-esophageal reflux disease without esophagitis: Secondary | ICD-10-CM

## 2019-01-03 DIAGNOSIS — R490 Dysphonia: Secondary | ICD-10-CM | POA: Diagnosis not present

## 2019-01-30 ENCOUNTER — Other Ambulatory Visit (HOSPITAL_COMMUNITY): Payer: Self-pay | Admitting: Family Medicine

## 2019-01-30 DIAGNOSIS — Z1231 Encounter for screening mammogram for malignant neoplasm of breast: Secondary | ICD-10-CM

## 2019-03-01 ENCOUNTER — Other Ambulatory Visit: Payer: Self-pay | Admitting: Cardiovascular Disease

## 2019-03-04 ENCOUNTER — Other Ambulatory Visit: Payer: Self-pay | Admitting: Obstetrics and Gynecology

## 2019-03-05 NOTE — Telephone Encounter (Signed)
Patient has complaints of urine burning, had dysuria the other night preventing sleep. [name] has an appointment with Urologist in November, and I suggested that she contact that office for urine sample testing now, to r/o Uti.  Pt sees Dr Hilma Favors for primary care, and could contact their office for u/a.  Since I have not seen pt in over a year, will not refil blader spasm meds . Pt will follow up with urologist or PCMD.

## 2019-03-06 DIAGNOSIS — R350 Frequency of micturition: Secondary | ICD-10-CM | POA: Diagnosis not present

## 2019-03-06 DIAGNOSIS — R39198 Other difficulties with micturition: Secondary | ICD-10-CM | POA: Diagnosis not present

## 2019-03-08 DIAGNOSIS — E785 Hyperlipidemia, unspecified: Secondary | ICD-10-CM | POA: Diagnosis not present

## 2019-03-08 DIAGNOSIS — N1832 Chronic kidney disease, stage 3b: Secondary | ICD-10-CM | POA: Diagnosis not present

## 2019-03-08 DIAGNOSIS — I129 Hypertensive chronic kidney disease with stage 1 through stage 4 chronic kidney disease, or unspecified chronic kidney disease: Secondary | ICD-10-CM | POA: Diagnosis not present

## 2019-03-14 ENCOUNTER — Other Ambulatory Visit: Payer: Self-pay

## 2019-03-14 ENCOUNTER — Encounter (HOSPITAL_COMMUNITY): Payer: Self-pay

## 2019-03-14 ENCOUNTER — Emergency Department (HOSPITAL_COMMUNITY): Payer: Medicare Other

## 2019-03-14 ENCOUNTER — Emergency Department (HOSPITAL_COMMUNITY)
Admission: EM | Admit: 2019-03-14 | Discharge: 2019-03-15 | Disposition: A | Discharge status: Home / Self Care | Payer: Medicare Other | Attending: Emergency Medicine | Admitting: Emergency Medicine

## 2019-03-14 DIAGNOSIS — B349 Viral infection, unspecified: Secondary | ICD-10-CM | POA: Diagnosis not present

## 2019-03-14 DIAGNOSIS — N189 Chronic kidney disease, unspecified: Secondary | ICD-10-CM | POA: Diagnosis not present

## 2019-03-14 DIAGNOSIS — Z7982 Long term (current) use of aspirin: Secondary | ICD-10-CM | POA: Diagnosis not present

## 2019-03-14 DIAGNOSIS — I2510 Atherosclerotic heart disease of native coronary artery without angina pectoris: Secondary | ICD-10-CM | POA: Diagnosis not present

## 2019-03-14 DIAGNOSIS — E860 Dehydration: Secondary | ICD-10-CM

## 2019-03-14 DIAGNOSIS — Z20828 Contact with and (suspected) exposure to other viral communicable diseases: Secondary | ICD-10-CM | POA: Diagnosis not present

## 2019-03-14 DIAGNOSIS — Z743 Need for continuous supervision: Secondary | ICD-10-CM | POA: Diagnosis not present

## 2019-03-14 DIAGNOSIS — I10 Essential (primary) hypertension: Secondary | ICD-10-CM | POA: Diagnosis not present

## 2019-03-14 DIAGNOSIS — Z79899 Other long term (current) drug therapy: Secondary | ICD-10-CM | POA: Insufficient documentation

## 2019-03-14 DIAGNOSIS — I129 Hypertensive chronic kidney disease with stage 1 through stage 4 chronic kidney disease, or unspecified chronic kidney disease: Secondary | ICD-10-CM | POA: Diagnosis not present

## 2019-03-14 DIAGNOSIS — R509 Fever, unspecified: Secondary | ICD-10-CM | POA: Diagnosis not present

## 2019-03-14 DIAGNOSIS — R110 Nausea: Secondary | ICD-10-CM | POA: Diagnosis not present

## 2019-03-14 DIAGNOSIS — R112 Nausea with vomiting, unspecified: Secondary | ICD-10-CM | POA: Diagnosis not present

## 2019-03-14 MED ORDER — SODIUM CHLORIDE 0.9 % IV BOLUS (SEPSIS)
1000.0000 mL | Freq: Once | INTRAVENOUS | Status: AC
  Administered 2019-03-15: 04:00:00 1000 mL via INTRAVENOUS

## 2019-03-14 MED ORDER — SODIUM CHLORIDE 0.9 % IV BOLUS (SEPSIS)
1000.0000 mL | Freq: Once | INTRAVENOUS | Status: AC
  Administered 2019-03-14: 1000 mL via INTRAVENOUS

## 2019-03-14 MED ORDER — SODIUM CHLORIDE 0.9 % IV BOLUS (SEPSIS): 250.0000 mL | Freq: Once | INTRAVENOUS | Status: DC

## 2019-03-14 MED ORDER — ACETAMINOPHEN 325 MG PO TABS
650.0000 mg | ORAL_TABLET | Freq: Once | ORAL | Status: AC
  Administered 2019-03-14: 325 mg via ORAL
  Filled 2019-03-14: qty 2

## 2019-03-14 NOTE — ED Triage Notes (Signed)
Pt's Son was Covid + 3 weeks ago.  Pt c/o fever at home x 2 days, chills, loss of appetite, loss of taste.

## 2019-03-14 NOTE — ED Provider Notes (Signed)
[facility] EMERGENCY DEPARTMENT Provider Note   CSN: [id number] Arrival date & time: 03/14/19  2239     History   Chief Complaint Chief Complaint  Patient presents with  . Fever    HPI [name] is a 85 y.o. female.     The history is provided by the patient.  Fever Severity:  Moderate Onset quality:  Gradual Duration:  2 days Timing:  Constant Progression:  Worsening Chronicity:  New Relieved by:  Nothing Worsened by:  Nothing Associated symptoms: chills, cough, diarrhea, headaches, nausea and vomiting   Associated symptoms: no chest pain   Risk factors: sick contacts   Patient with history of CAD, chronic kidney disease presents with fever.  She reports 2 days ago she had fevers and chills.  She also reports vomiting and diarrhea.  She has had mild cough. Denies any chest pain or shortness of breath. Reports her son had Covid approximately 3 weeks ago with mild symptom   Past Medical History:  Diagnosis Date  . Anemia   . Anxiety   . Arthritis   . Bleeding from the nose Oct 2014  . CAD (coronary artery disease)   . Cataract of both eyes   . Chronic kidney disease   . Colon polyps   . Hyperlipidemia   . Hypertension    x 5-6 yrs.  Marland Kitchen LBBB (left bundle branch block)   . Renal insufficiency     Patient Active Problem List   Diagnosis Date Noted  . Urgency incontinence 12/27/2017  . Pelvic and perineal pain 12/10/2016  . Pelvic mass in female 11/28/2016  . Hypertensive heart disease 01/17/2015  . History of renal insufficiency syndrome 05/12/2014  . stage 2 hypertension 05/03/2013  . CAD (coronary artery disease) 05/03/2013  . GERD (gastroesophageal reflux disease) 05/03/2013  . LBBB (left bundle branch block) 05/03/2013  . Hemorrhoids 03/12/2013  . Epistaxis 03/12/2013  . Anemia, iron deficiency 03/02/2013  . Anemia macrocytic 12/20/2012  . Personal history of colonic polyps 12/20/2012    Past Surgical History:  Procedure Laterality Date   . BREAST SURGERY     masses removed while she was pregnant  . CARDIAC CATHETERIZATION  06/20/2008   severe single vessel high-grade stenosis of mid LAD at bifurcation of diagonal 1 and setpal perforator 1; diffuse coronary calcification of L coronary system; 50% stenosis of mid RCA (Dr. Jackie Plum)  . CATARACT EXTRACTION W/PHACO Right 10/10/2013   Procedure: CATARACT EXTRACTION PHACO AND INTRAOCULAR LENS PLACEMENT (IOC);  Surgeon: Tonny Branch, MD;  Location: [facility];  Service: Ophthalmology;  Laterality: Right;  CDE:11.96  . CATARACT EXTRACTION W/PHACO Left 11/18/2013   Procedure: CATARACT EXTRACTION PHACO AND INTRAOCULAR LENS PLACEMENT (IOC);  Surgeon: Tonny Branch, MD;  Location: [facility];  Service: Ophthalmology;  Laterality: Left;  CDE 15.96  . COLONOSCOPY N/A 01/16/2013   Procedure: COLONOSCOPY;  Surgeon: Rogene Houston, MD;  [facility];  Service: Endoscopy;  Laterality: N/A;  1200  . CORONARY ANGIOPLASTY WITH STENT PLACEMENT  11/08/2010   complex high-speed rotational atherectomy of calcified LAD, diffusely disease LAD system (Dr. Corky Downs)  . EXCISION ORAL TUMOR Right 01/14/2014   Procedure: EXCISION OROPHARYNGEAL MASS;  Surgeon: Ascencion Dike, MD;  Location: [facility];  Service: ENT;  Laterality: Right;  . NM MYOCAR PERF WALL MOTION  05/2011   lexiscan myoview; normal pattern of perfusion in all regions; post-stress EF 55%; low risk scan   . PARTIAL HYSTERECTOMY    .  TONSILLECTOMY    . TRANSTHORACIC ECHOCARDIOGRAM  10/2010   EF 45-50%, mod conc LVH, grade 1 diastolic dysfunction, mildly calcified left AV cusp; calcified MV annulus     OB History    Gravida  4   Para  4   Term      Preterm      AB      Living  3     SAB      TAB      Ectopic      Multiple      Live Births               Home Medications    Prior to Admission medications   Medication Sig Start Date End Date Taking? Authorizing Provider  amLODipine (NORVASC) 10 MG tablet Take 5 mg  by mouth daily.    [provider]  aspirin 81 MG tablet Take 81 mg by mouth daily.     [provider]  FLAXSEED, LINSEED, PO Take 1 capsule by mouth daily.    [provider]  furosemide (LASIX) 40 MG tablet Take 40 mg by mouth as needed.  12/25/17   [provider]  isosorbide mononitrate (IMDUR) 120 MG 24 hr tablet Take 1 tablet by mouth once daily 03/01/19   Herminio Commons, MD  metoprolol succinate (TOPROL-XL) 25 MG 24 hr tablet Take 1 tablet (25 mg total) by mouth daily. Take with or immediately following a meal. Patient taking differently: Take 12.5 mg by mouth 2 (two) times daily. Take with or immediately following a meal. 08/24/18 08/24/19  Barrett, Evelene Croon, PA-C  Multiple Vitamin (MULTIVITAMIN) tablet Take 1 tablet by mouth daily.    [provider]  nitroGLYCERIN (NITROSTAT) 0.4 MG SL tablet Place 1 tablet (0.4 mg total) under the tongue every 5 (five) minutes as needed for chest pain. 08/24/18   Barrett, Evelene Croon, PA-C  rosuvastatin (CRESTOR) 5 MG tablet Take 1 tablet (5 mg total) by mouth daily. 12/17/18 03/17/19  Herminio Commons, MD  Vitamin D, Ergocalciferol, (DRISDOL) 50000 units CAPS capsule TAKE 1 CAPSULE BY MOUTH ONCE A WEEK PT TO TAKE 2000 UNITS OF VITAMIN D OVER THE COUNTER DAILY AFTER THIS RX IS FINISHED 10/19/17   [provider]    Family History Family History  Problem Relation Age of Onset  . Liver cancer Mother   . Cancer Father   . Heart Problems Brother   . Coronary artery disease Sister        stent  . Heart Problems Daughter        also stomach problems  . COPD Daughter   . Colon cancer Neg Hx     Social History Social History   Tobacco Use  . Smoking status: Never Smoker  . Smokeless tobacco: Never Used  Substance Use Topics  . Alcohol use: No  . Drug use: No     Allergies   Patient has no known allergies.   Review of Systems Review of Systems  Constitutional: Positive for chills  and fever.  Respiratory: Positive for cough.   Cardiovascular: Negative for chest pain.  Gastrointestinal: Positive for diarrhea, nausea and vomiting.  Neurological: Positive for headaches.  All other systems reviewed and are negative.    Physical Exam Updated Vital Signs BP (!) 159/67 (BP Location: Left Arm)   Pulse 92   Temp (!) 102.2 F (39 C) (Oral)   Resp 20   Ht 1.562 m (5' 1.5")  Wt 72.1 kg   SpO2 94%   BMI 29.56 kg/m   Physical Exam CONSTITUTIONAL: Elderly, no acute distress HEAD: Normocephalic/atraumatic EYES: EOMI/PERRL ENMT: Mucous membranes moist NECK: supple no meningeal signs SPINE/BACK:entire spine nontender CV: S1/S2 noted, no murmurs/rubs/gallops noted LUNGS: Lungs are clear to auscultation bilaterally, no apparent distress ABDOMEN: soft, nontender, no rebound or guarding, bowel sounds noted throughout abdomen GU:no cva tenderness NEURO: Pt is awake/alert/appropriate, moves all extremitiesx4.  No facial droop.   EXTREMITIES: pulses normal/equal, full ROM SKIN: warm, color normal PSYCH: no abnormalities of mood noted, alert and oriented to situation   ED Treatments / Results  Labs (all labs ordered are listed, but only abnormal results are displayed) Labs Reviewed  COMPREHENSIVE METABOLIC PANEL - Abnormal; Notable for the following components:      Result Value   Sodium 134 (*)    CO2 20 (*)    Glucose, Bld 142 (*)    BUN 35 (*)    Creatinine, Ser 2.05 (*)    Calcium 8.5 (*)    GFR calc non Af Amer 22 (*)    GFR calc Af Amer 25 (*)    All other components within normal limits  CBC WITH DIFFERENTIAL/PLATELET - Abnormal; Notable for the following components:   RBC 3.12 (*)    Hemoglobin 10.3 (*)    HCT 32.1 (*)    MCV 102.9 (*)    Platelets 131 (*)    Neutro Abs 9.2 (*)    Lymphs Abs 0.3 (*)    Abs Immature Granulocytes 0.08 (*)    All other components within normal limits  PROTIME-INR - Abnormal; Notable for the following components:    Prothrombin Time 15.8 (*)    INR 1.3 (*)    All other components within normal limits  URINALYSIS, ROUTINE W REFLEX MICROSCOPIC - Abnormal; Notable for the following components:   APPearance HAZY (*)    Glucose, UA 50 (*)    Ketones, ur 20 (*)    Protein, ur 30 (*)    Leukocytes,Ua TRACE (*)    Bacteria, UA FEW (*)    All other components within normal limits  SARS CORONAVIRUS 2 (TAT 6-24 HRS)  CULTURE, BLOOD (ROUTINE X 2)  CULTURE, BLOOD (ROUTINE X 2)  URINE CULTURE  LACTIC ACID, PLASMA  APTT    EKG EKG Interpretation  Date/Time:  Friday March 15 2019 00:07:44 EST Ventricular Rate:  83 PR Interval:    QRS Duration: 155 QT Interval:  427 QTC Calculation: 499 R Axis:   52 Text Interpretation: Sinus rhythm Left bundle branch block No significant change since last tracing Confirmed by Ripley Fraise ([id number]) on 03/15/2019 12:29:38 AM   Radiology Dg Chest Port 1 View  Result Date: 03/14/2019 CLINICAL DATA:  Fever EXAM: PORTABLE CHEST 1 VIEW COMPARISON:  11/04/2010 FINDINGS: Heart is upper limits normal in size. Left retrocardiac density, likely atelectasis. Right lung clear. No effusions. No acute bony abnormality. IMPRESSION: Left basilar opacity, likely atelectasis. Electronically Signed   By: Rolm Baptise M.D.   On: 03/14/2019 23:48    Procedures Procedures  Medications Ordered in ED Medications  acetaminophen (TYLENOL) tablet 650 mg (325 mg Oral Given 03/14/19 2307)  sodium chloride 0.9 % bolus 1,000 mL (1,000 mLs Intravenous New Bag/Given 03/14/19 2350)    And  sodium chloride 0.9 % bolus 1,000 mL (1,000 mLs Intravenous New Bag/Given 03/15/19 0331)     Initial Impression / Assessment and Plan / ED Course  I have reviewed the triage  vital signs and the nursing notes.  Pertinent labs & imaging results that were available during my care of the patient were reviewed by me and considered in my medical decision making (see chart for details).        1:34 AM  Patient presents with fever chills vomiting diarrhea.  She is feeling improved after IV fluids.  Will defer any antibiotics for now.  Will need to check urinalysis.  We will continue to monitor 2:49 AM Patient reports feeling improved.  She walked to the bathroom without incident.  Pulse ox was in the 90s upon ambulation.  Awaiting urinalysis 4:09 AM No signs of UTI.  Patient is improved.  She is resting comfortably. She feels comfortable for discharge home.  No signs of sepsis.  Suspect viral illness, Covid test is pending.  We discussed quarantine procedures.  We also discussed strict ER return precautions. She will continue to rest here for approximately 2 hours until her husband can pick her up Current blood pressure and heart rate remain appropriate.  Jhene C Sunderlin was evaluated in Emergency Department on 03/14/2019 for the symptoms described in the history of present illness. She was evaluated in the context of the global COVID-19 pandemic, which necessitated consideration that the patient might be at risk for infection with the SARS-CoV-2 virus that causes COVID-19. Institutional protocols and algorithms that pertain to the evaluation of patients at risk for COVID-19 are in a state of rapid change based on information released by regulatory bodies including the CDC and federal and state organizations. These policies and algorithms were followed during the patient's care in the ED.  Final Clinical Impressions(s) / ED Diagnoses   Final diagnoses:  Viral illness  Dehydration    ED Discharge Orders    None       Ripley Fraise, MD 03/15/19 1191

## 2019-03-14 NOTE — ED Notes (Signed)
Pt states she only wanted to take 325mg  of tylenol right now since she has CKD.

## 2019-03-15 LAB — URINALYSIS, ROUTINE W REFLEX MICROSCOPIC
Bilirubin Urine: NEGATIVE
Glucose, UA: 50 mg/dL — AB
Hgb urine dipstick: NEGATIVE
Ketones, ur: 20 mg/dL — AB
Nitrite: NEGATIVE
Protein, ur: 30 mg/dL — AB
Specific Gravity, Urine: 1.013 (ref 1.005–1.030)
pH: 5 (ref 5.0–8.0)

## 2019-03-15 LAB — COMPREHENSIVE METABOLIC PANEL
ALT: 15 U/L (ref 0–44)
AST: 19 U/L (ref 15–41)
Albumin: 3.6 g/dL (ref 3.5–5.0)
Alkaline Phosphatase: 61 U/L (ref 38–126)
Anion gap: 10 (ref 5–15)
BUN: 35 mg/dL — ABNORMAL HIGH (ref 8–23)
CO2: 20 mmol/L — ABNORMAL LOW (ref 22–32)
Calcium: 8.5 mg/dL — ABNORMAL LOW (ref 8.9–10.3)
Chloride: 104 mmol/L (ref 98–111)
Creatinine, Ser: 2.05 mg/dL — ABNORMAL HIGH (ref 0.44–1.00)
GFR calc Af Amer: 25 mL/min — ABNORMAL LOW (ref >60)
GFR calc non Af Amer: 22 mL/min — ABNORMAL LOW (ref >60)
Glucose, Bld: 142 mg/dL — ABNORMAL HIGH (ref 70–99)
Potassium: 4.1 mmol/L (ref 3.5–5.1)
Sodium: 134 mmol/L — ABNORMAL LOW (ref 135–145)
Total Bilirubin: 1 mg/dL (ref 0.3–1.2)
Total Protein: 6.6 g/dL (ref 6.5–8.1)

## 2019-03-15 LAB — LACTIC ACID, PLASMA: Lactic Acid, Venous: 1.1 mmol/L (ref 0.5–1.9)

## 2019-03-15 LAB — CBC WITH DIFFERENTIAL/PLATELET
Abs Immature Granulocytes: 0.08 10*3/uL — ABNORMAL HIGH (ref 0.00–0.07)
Basophils Absolute: 0 10*3/uL (ref 0.0–0.1)
Basophils Relative: 0 %
Eosinophils Absolute: 0.2 10*3/uL (ref 0.0–0.5)
Eosinophils Relative: 2 %
HCT: 32.1 % — ABNORMAL LOW (ref 36.0–46.0)
Hemoglobin: 10.3 g/dL — ABNORMAL LOW (ref 12.0–15.0)
Immature Granulocytes: 1 %
Lymphocytes Relative: 3 %
Lymphs Abs: 0.3 10*3/uL — ABNORMAL LOW (ref 0.7–4.0)
MCH: 33 pg (ref 26.0–34.0)
MCHC: 32.1 g/dL (ref 30.0–36.0)
MCV: 102.9 fL — ABNORMAL HIGH (ref 80.0–100.0)
Monocytes Absolute: 0.8 10*3/uL (ref 0.1–1.0)
Monocytes Relative: 7 %
Neutro Abs: 9.2 10*3/uL — ABNORMAL HIGH (ref 1.7–7.7)
Neutrophils Relative %: 87 %
Platelets: 131 10*3/uL — ABNORMAL LOW (ref 150–400)
RBC: 3.12 MIL/uL — ABNORMAL LOW (ref 3.87–5.11)
RDW: 13.1 % (ref 11.5–15.5)
WBC: 10.5 10*3/uL (ref 4.0–10.5)
nRBC: 0 % (ref 0.0–0.2)

## 2019-03-15 LAB — PROTIME-INR
INR: 1.3 — ABNORMAL HIGH (ref 0.8–1.2)
Prothrombin Time: 15.8 seconds — ABNORMAL HIGH (ref 11.4–15.2)

## 2019-03-15 LAB — SARS CORONAVIRUS 2 (TAT 6-24 HRS): SARS Coronavirus 2: NEGATIVE

## 2019-03-15 LAB — APTT: aPTT: 32 seconds (ref 24–36)

## 2019-03-16 LAB — URINE CULTURE: Culture: <10000 — AB

## 2019-03-20 DIAGNOSIS — R350 Frequency of micturition: Secondary | ICD-10-CM | POA: Diagnosis not present

## 2019-03-20 DIAGNOSIS — Z889 Allergy status to unspecified drugs, medicaments and biological substances status: Secondary | ICD-10-CM | POA: Diagnosis not present

## 2019-03-20 DIAGNOSIS — N342 Other urethritis: Secondary | ICD-10-CM | POA: Diagnosis not present

## 2019-03-20 LAB — CULTURE, BLOOD (ROUTINE X 2)
Culture: NO GROWTH
Culture: NO GROWTH
Special Requests: ADEQUATE
Special Requests: ADEQUATE

## 2019-03-27 ENCOUNTER — Other Ambulatory Visit (HOSPITAL_COMMUNITY)
Admission: AD | Admit: 2019-03-27 | Discharge: 2019-03-27 | Disposition: A | Discharge status: Home / Self Care | Payer: Medicare Other | Source: Skilled Nursing Facility | Attending: Urology | Admitting: Urology

## 2019-03-27 ENCOUNTER — Ambulatory Visit (INDEPENDENT_AMBULATORY_CARE_PROVIDER_SITE_OTHER): Payer: Medicare Other | Admitting: Urology

## 2019-03-27 DIAGNOSIS — N3021 Other chronic cystitis with hematuria: Secondary | ICD-10-CM | POA: Diagnosis not present

## 2019-03-29 LAB — URINE CULTURE: Culture: >=100000 — AB

## 2019-05-08 ENCOUNTER — Other Ambulatory Visit: Payer: Self-pay

## 2019-05-08 ENCOUNTER — Other Ambulatory Visit (HOSPITAL_COMMUNITY)
Admission: RE | Admit: 2019-05-08 | Discharge: 2019-05-08 | Disposition: A | Discharge status: Home / Self Care | Payer: Medicare Other | Source: Ambulatory Visit | Attending: Urology | Admitting: Urology

## 2019-05-08 ENCOUNTER — Ambulatory Visit: Payer: Medicare Other | Admitting: Urology

## 2019-05-08 ENCOUNTER — Encounter: Payer: Self-pay | Admitting: Urology

## 2019-05-08 ENCOUNTER — Ambulatory Visit (INDEPENDENT_AMBULATORY_CARE_PROVIDER_SITE_OTHER): Payer: Medicare Other | Admitting: Urology

## 2019-05-08 VITALS — BP 163/72 | HR 80 | Temp 96.5°F | Ht 62.0 in | Wt 162.0 lb

## 2019-05-08 DIAGNOSIS — N3021 Other chronic cystitis with hematuria: Secondary | ICD-10-CM | POA: Insufficient documentation

## 2019-05-08 DIAGNOSIS — N3020 Other chronic cystitis without hematuria: Secondary | ICD-10-CM | POA: Insufficient documentation

## 2019-05-08 LAB — POCT URINALYSIS DIPSTICK
Bilirubin, UA: NEGATIVE
Glucose, UA: NEGATIVE
Ketones, UA: NEGATIVE
Nitrite, UA: POSITIVE
Protein, UA: POSITIVE — AB
Spec Grav, UA: >=1.03 — AB (ref 1.010–1.025)
Urobilinogen, UA: NEGATIVE E.U./dL — AB
pH, UA: 6 (ref 5.0–8.0)

## 2019-05-08 NOTE — Patient Instructions (Signed)
Urinary Tract Infection, Adult A urinary tract infection (UTI) is an infection of any part of the urinary tract. The urinary tract includes:  The kidneys.  The ureters.  The bladder.  The urethra. These organs make, store, and get rid of pee (urine) in the body. What are the causes? This is caused by germs (bacteria) in your genital area. These germs grow and cause swelling (inflammation) of your urinary tract. What increases the risk? You are more likely to develop this condition if:  You have a small, thin tube (catheter) to drain pee.  You cannot control when you pee or poop (incontinence).  You are female, and: ? You use these methods to prevent pregnancy: ? A medicine that kills sperm (spermicide). ? A device that blocks sperm (diaphragm). ? You have low levels of a female hormone (estrogen). ? You are pregnant.  You have genes that add to your risk.  You are sexually active.  You take antibiotic medicines.  You have trouble peeing because of: ? A prostate that is bigger than normal, if you are female. ? A blockage in the part of your body that drains pee from the bladder (urethra). ? A kidney stone. ? A nerve condition that affects your bladder (neurogenic bladder). ? Not getting enough to drink. ? Not peeing often enough.  You have other conditions, such as: ? Diabetes. ? A weak disease-fighting system (immune system). ? Sickle cell disease. ? Gout. ? Injury of the spine. What are the signs or symptoms? Symptoms of this condition include:  Needing to pee right away (urgently).  Peeing often.  Peeing small amounts often.  Pain or burning when peeing.  Blood in the pee.  Pee that smells bad or not like normal.  Trouble peeing.  Pee that is cloudy.  Fluid coming from the vagina, if you are female.  Pain in the belly or lower back. Other symptoms include:  Throwing up (vomiting).  No urge to eat.  Feeling mixed up (confused).  Being tired  and grouchy (irritable).  A fever.  Watery poop (diarrhea). How is this treated? This condition may be treated with:  Antibiotic medicine.  Other medicines.  Drinking enough water. Follow these instructions at home:  Medicines  Take over-the-counter and prescription medicines only as told by your doctor.  If you were prescribed an antibiotic medicine, take it as told by your doctor. Do not stop taking it even if you start to feel better. General instructions  Make sure you: ? Pee until your bladder is empty. ? Do not hold pee for a long time. ? Empty your bladder after sex. ? Wipe from front to back after pooping if you are a female. Use each tissue one time when you wipe.  Drink enough fluid to keep your pee pale yellow.  Keep all follow-up visits as told by your doctor. This is important. Contact a doctor if:  You do not get better after 1-2 days.  Your symptoms go away and then come back. Get help right away if:  You have very bad back pain.  You have very bad pain in your lower belly.  You have a fever.  You are sick to your stomach (nauseous).  You are throwing up. Summary  A urinary tract infection (UTI) is an infection of any part of the urinary tract.  This condition is caused by germs in your genital area.  There are many risk factors for a UTI. These include having a small, thin   tube to drain pee and not being able to control when you pee or poop.  Treatment includes antibiotic medicines for germs.  Drink enough fluid to keep your pee pale yellow. This information is not intended to replace advice given to you by your health care provider. Make sure you discuss any questions you have with your health care provider. Document Released: 10/12/2007 Document Revised: 04/12/2018 Document Reviewed: 11/02/2017 Elsevier Patient Education  2020 Elsevier Inc.  

## 2019-05-08 NOTE — Progress Notes (Signed)
05/08/2019 1:42 PM   [name] 12/30/1933 [id number]  Referring provider: Sharilyn Sites, MD [address] [place],  [place] [address]  Chronic cystitis  HPI: [name] is a 85yo with a hx of recurrent UTI here for followup. She was treated with ceftin last visit which relieved her UTI symptoms. She has vaginal irritation and is currently using neosporin which has exacerbated the vaginal burning. No worsening LUTS. UA today shows leuks. No hematuria. Vaginal burning has been present for 2 weeks. No other associated symptoms. No exacerbating/alleviating events.   PMH: Past Medical History:  Diagnosis Date  . Anemia   . Anxiety   . Arthritis   . Bleeding from the nose Oct 2014  . CAD (coronary artery disease)   . Cataract of both eyes   . Chronic kidney disease   . Colon polyps   . Hyperlipidemia   . Hypertension    x 5-6 yrs.  Marland Kitchen LBBB (left bundle branch block)   . Renal insufficiency     Surgical History: Past Surgical History:  Procedure Laterality Date  . BREAST SURGERY     masses removed while she was pregnant  . CARDIAC CATHETERIZATION  06/20/2008   severe single vessel high-grade stenosis of mid LAD at bifurcation of diagonal 1 and setpal perforator 1; diffuse coronary calcification of L coronary system; 50% stenosis of mid RCA (Dr. Jackie Plum)  . CATARACT EXTRACTION W/PHACO Right 10/10/2013   Procedure: CATARACT EXTRACTION PHACO AND INTRAOCULAR LENS PLACEMENT (IOC);  Surgeon: Tonny Branch, MD;  Location: [facility];  Service: Ophthalmology;  Laterality: Right;  CDE:11.96  . CATARACT EXTRACTION W/PHACO Left 11/18/2013   Procedure: CATARACT EXTRACTION PHACO AND INTRAOCULAR LENS PLACEMENT (IOC);  Surgeon: Tonny Branch, MD;  Location: [facility];  Service: Ophthalmology;  Laterality: Left;  CDE 15.96  . COLONOSCOPY N/A 01/16/2013   Procedure: COLONOSCOPY;  Surgeon: Rogene Houston, MD;  [facility];  Service: Endoscopy;  Laterality: N/A;  1200  . CORONARY ANGIOPLASTY  WITH STENT PLACEMENT  11/08/2010   complex high-speed rotational atherectomy of calcified LAD, diffusely disease LAD system (Dr. Corky Downs)  . EXCISION ORAL TUMOR Right 01/14/2014   Procedure: EXCISION OROPHARYNGEAL MASS;  Surgeon: Ascencion Dike, MD;  Location: [facility];  Service: ENT;  Laterality: Right;  . NM MYOCAR PERF WALL MOTION  05/2011   lexiscan myoview; normal pattern of perfusion in all regions; post-stress EF 55%; low risk scan   . PARTIAL HYSTERECTOMY    . TONSILLECTOMY    . TRANSTHORACIC ECHOCARDIOGRAM  10/2010   EF 45-50%, mod conc LVH, grade 1 diastolic dysfunction, mildly calcified left AV cusp; calcified MV annulus    Home Medications:  Allergies as of 05/08/2019   No Known Allergies     Medication List       Accurate as of May 08, 2019  1:42 PM. If you have any questions, ask your nurse or doctor.        amLODipine 10 MG tablet Commonly known as: NORVASC Take 5 mg by mouth daily.   aspirin 81 MG tablet Take 81 mg by mouth daily.   FLAXSEED (LINSEED) PO Take 1 capsule by mouth daily.   furosemide 40 MG tablet Commonly known as: LASIX Take 40 mg by mouth as needed.   isosorbide mononitrate 120 MG 24 hr tablet Commonly known as: IMDUR Take 1 tablet by mouth once daily   metoprolol succinate 25 MG 24 hr tablet Commonly known as: Toprol XL  Take 1 tablet (25 mg total) by mouth daily. Take with or immediately following a meal. What changed:   how much to take  when to take this   multivitamin tablet Take 1 tablet by mouth daily.   nitroGLYCERIN 0.4 MG SL tablet Commonly known as: Nitrostat Place 1 tablet (0.4 mg total) under the tongue every 5 (five) minutes as needed for chest pain.   rosuvastatin 5 MG tablet Commonly known as: CRESTOR Take 1 tablet (5 mg total) by mouth daily.   Vitamin D (Ergocalciferol) 1.25 MG (50000 UT) Caps capsule Commonly known as: DRISDOL TAKE 1 CAPSULE BY MOUTH ONCE A WEEK PT TO TAKE 2000 UNITS OF  VITAMIN D OVER THE COUNTER DAILY AFTER THIS RX IS FINISHED       Allergies: No Known Allergies  Family History: Family History  Problem Relation Age of Onset  . Liver cancer Mother   . Cancer Father   . Heart Problems Brother   . Coronary artery disease Sister        stent  . Heart Problems Daughter        also stomach problems  . COPD Daughter   . Colon cancer Neg Hx     Social History:  reports that she has never smoked. She has never used smokeless tobacco. She reports that she does not drink alcohol or use drugs.  ROS:   Urological Symptom Review  Patient is experiencing the following symptoms: Frequent urination   Review of Systems  Gastrointestinal (upper)  : Negative for upper GI symptoms  Gastrointestinal (lower) : Negative for lower GI symptoms  Constitutional : Negative for symptoms  Skin: Negative for skin symptoms  Eyes: Negative for eye symptoms  Ear/Nose/Throat : Negative for Ear/Nose/Throat symptoms  Hematologic/Lymphatic: Negative for Hematologic/Lymphatic symptoms  Cardiovascular : Negative for cardiovascular symptoms  Respiratory : Negative for respiratory symptoms  Endocrine: Negative for endocrine symptoms  Musculoskeletal: Negative for musculoskeletal symptoms  Neurological: Negative for neurological symptoms  Psychologic: Negative for psychiatric symptoms                                       Physical Exam: BP (!) 163/72   Pulse 80   Temp (!) 96.5 F (35.8 C)   Ht 5\' 2"  (1.575 m)   Wt 162 lb (73.5 kg)   BMI 29.63 kg/m   Constitutional:  Alert and oriented, No acute distress. HEENT: [place] AT, moist mucus membranes.  Trachea midline, no masses. Cardiovascular: No clubbing, cyanosis, or edema. Respiratory: Normal respiratory effort, no increased work of breathing. GI: Abdomen is soft, nontender, nondistended, no abdominal masses GU: No CVA tenderness Lymph: No cervical or inguinal  lymphadenopathy. Skin: No rashes, bruises or suspicious lesions. Neurologic: Grossly intact, no focal deficits, moving all 4 extremities. Psychiatric: Normal mood and affect.  Laboratory Data: Lab Results  Component Value Date   WBC 10.5 03/14/2019   HGB 10.3 (L) 03/14/2019   HCT 32.1 (L) 03/14/2019   MCV 102.9 (H) 03/14/2019   PLT 131 (L) 03/14/2019    Lab Results  Component Value Date   CREATININE 2.05 (H) 03/14/2019    No results found for: PSA  No results found for: TESTOSTERONE  No results found for: HGBA1C  Urinalysis    Component Value Date/Time   COLORURINE YELLOW 03/15/2019 0246   APPEARANCEUR HAZY (A) 03/15/2019 0246   LABSPEC 1.013 03/15/2019 0246   PHURINE  5.0 03/15/2019 0246   GLUCOSEU 50 (A) 03/15/2019 0246   HGBUR NEGATIVE 03/15/2019 0246   BILIRUBINUR NEGATIVE 03/15/2019 0246   KETONESUR 20 (A) 03/15/2019 0246   PROTEINUR 30 (A) 03/15/2019 0246   NITRITE NEGATIVE 03/15/2019 0246   LEUKOCYTESUR TRACE (A) 03/15/2019 0246    Lab Results  Component Value Date   BACTERIA FEW (A) 03/15/2019    Pertinent Imaging:  No results found for this or any previous visit. No results found for this or any previous visit. No results found for this or any previous visit. No results found for this or any previous visit. Results for orders placed during the hospital encounter of 02/14/18  US RENAL   Narrative CLINICAL DATA:  Chronic stage 3 kidney disease.  EXAM: RENAL / URINARY TRACT ULTRASOUND COMPLETE  COMPARISON:  CT scan 12/19/2016  FINDINGS: Right Kidney:  Length: 8.1 cm. Mild renal cortical thinning but normal echogenicity. No focal lesions or hydronephrosis. No obvious renal calculi.  Left Kidney:  Length: 9.3 cm. Mild renal cortical thinning but normal echogenicity. No focal lesions or hydronephrosis. No definite renal calculi.  Bladder:  Appears normal for degree of bladder distention.  IMPRESSION: Mild renal cortical thinning but  normal echogenicity without focal lesion or hydronephrosis.   Electronically Signed   By: Marijo Sanes M.D.   On: 02/14/2018 16:30    No results found for this or any previous visit. No results found for this or any previous visit. No results found for this or any previous visit.  Assessment & Plan:    1. Chronic cystitis -We will start premarin cream 3x per week for 3 months. Risks/benefits/alternatives discussed -urine for culture, will call with results Nicolette Bang, MD  [facility] [place]

## 2019-05-09 DIAGNOSIS — I129 Hypertensive chronic kidney disease with stage 1 through stage 4 chronic kidney disease, or unspecified chronic kidney disease: Secondary | ICD-10-CM | POA: Diagnosis not present

## 2019-05-09 DIAGNOSIS — N184 Chronic kidney disease, stage 4 (severe): Secondary | ICD-10-CM | POA: Diagnosis not present

## 2019-05-10 LAB — URINE CULTURE: Culture: >=100000 — AB

## 2019-07-07 DIAGNOSIS — N184 Chronic kidney disease, stage 4 (severe): Secondary | ICD-10-CM | POA: Diagnosis not present

## 2019-07-07 DIAGNOSIS — I129 Hypertensive chronic kidney disease with stage 1 through stage 4 chronic kidney disease, or unspecified chronic kidney disease: Secondary | ICD-10-CM | POA: Diagnosis not present

## 2019-07-15 ENCOUNTER — Telehealth: Payer: Self-pay

## 2019-07-15 NOTE — Telephone Encounter (Signed)

## 2019-07-16 ENCOUNTER — Other Ambulatory Visit: Payer: Self-pay

## 2019-07-16 ENCOUNTER — Encounter: Payer: Self-pay | Admitting: Student

## 2019-07-16 ENCOUNTER — Telehealth (INDEPENDENT_AMBULATORY_CARE_PROVIDER_SITE_OTHER): Payer: Medicare Other | Admitting: Student

## 2019-07-16 VITALS — BP 177/65 | HR 68 | Ht 62.0 in | Wt 162.0 lb

## 2019-07-16 DIAGNOSIS — I10 Essential (primary) hypertension: Secondary | ICD-10-CM

## 2019-07-16 DIAGNOSIS — I25118 Atherosclerotic heart disease of native coronary artery with other forms of angina pectoris: Secondary | ICD-10-CM | POA: Diagnosis not present

## 2019-07-16 DIAGNOSIS — E785 Hyperlipidemia, unspecified: Secondary | ICD-10-CM

## 2019-07-16 DIAGNOSIS — R002 Palpitations: Secondary | ICD-10-CM

## 2019-07-16 MED ORDER — NITROGLYCERIN 0.4 MG SL SUBL: 0.4000 mg | SUBLINGUAL_TABLET | SUBLINGUAL | 3 refills | Status: DC | PRN

## 2019-07-16 MED ORDER — METOPROLOL SUCCINATE ER 25 MG PO TB24: 25.0000 mg | ORAL_TABLET | Freq: Two times a day (BID) | ORAL | 3 refills | Status: DC

## 2019-07-16 NOTE — Patient Instructions (Addendum)
Medication Instructions:  Your physician recommends that you continue on your current medications as directed. Please refer to the Current Medication list given to you today. Increase Toprol XL to 25 mg to 25 mg Two Times Daily   *If you need a refill on your cardiac medications before your next appointment, please call your pharmacy*   Lab Work: NONE  If you have labs (blood work) drawn today and your tests are completely normal, you will receive your results only by: Marland Kitchen MyChart Message (if you have MyChart) OR . A paper copy in the mail If you have any lab test that is abnormal or we need to change your treatment, we will call you to review the results.   Testing/Procedures: NONE    Follow-Up: At [facility], you and your health needs are our priority.  As part of our continuing mission to provide you with exceptional heart care, we have created designated Provider Care Teams.  These Care Teams include your primary Cardiologist (physician) and Advanced Practice Providers (APPs -  Physician Assistants and Nurse Practitioners) who all work together to provide you with the care you need, when you need it.  We recommend signing up for the patient portal called "MyChart".  Sign up information is provided on this After Visit Summary.  MyChart is used to connect with patients for Virtual Visits (Telemedicine).  Patients are able to view lab/test results, encounter notes, upcoming appointments, etc.  Non-urgent messages can be sent to your provider as well.   To learn more about what you can do with MyChart, go to [email or web address].    Your next appointment:   5-6  month(s)  The format for your next appointment:   In Person  Provider:   Kate Sable, MD   Other Instructions Call to report symptoms in 2-3 weeks   Thank you for choosing [place]!

## 2019-07-16 NOTE — Progress Notes (Signed)
Virtual Visit via Telephone Note   This visit type was conducted due to national recommendations for restrictions regarding the COVID-19 Pandemic (e.g. social distancing) in an effort to limit this patient's exposure and mitigate transmission in our community.  Due to her co-morbid illnesses, this patient is at least at moderate risk for complications without adequate follow up.  This format is felt to be most appropriate for this patient at this time.  The patient did not have access to video technology/had technical difficulties with video requiring transitioning to audio format only (telephone).  All issues noted in this document were discussed and addressed.  No physical exam could be performed with this format.  Please refer to the patient's chart for her  consent to telehealth for [facility].   The patient was identified using 2 identifiers.  Date:  07/16/2019   ID:  [name], DOB Jul 04, 1933, MRN [id number]  Patient Location: Home Provider Location: Office  PCP:  Sharilyn Sites, MD  Cardiologist:  Kate Sable, MD  Electrophysiologist:  None   Evaluation Performed:  Follow-Up Visit  Chief Complaint:  6 month visit  History of Present Illness:    [name] is a 86 y.o. female with past medical history of CAD (s/p rotational atherectomy and DES placement to LAD in 2012, low-risk NST in 08/2018), HTN, HLD, and known LBBB who presents for a 81-month follow-up visit.  She was last examined by Dr. Bronson Ing in 12/2018 and denied any recent exertional chest pain at that time. She did report having some lower back pain which would occur when walking to her mailbox but would resolve. She was continued on ASA, Toprol-XL and Imdur with Crestor 5 mg daily being reinitiated.  In talking with the patient today, she reports being under increased stress as she is the primary caregiver for her husband. He has lost a substantial amount of weight and they are concerned he might have  cancer with an MRI currently pending.   Over the past month, she has started to experience episodes of her heart "fluttering". Symptoms typically occur at rest and resolve within a minute. No associated symptoms when this occurs. Consumes 1 cup of coffee in the AM with occasional tea throughout the day. She does report intermittent dizziness at night which sometimes improves with positional changes. No recent exertional chest pain, dyspnea, orthopnea, PND or edema.   BP was elevated at 177/65 today but she reports SBP is typically in the 140's to 150's. Reports her husband did not have a good morning and she thinks this might have contributed to her elevated readings today.   The patient does not have symptoms concerning for COVID-19 infection (fever, chills, cough, or new shortness of breath).    Past Medical History:  Diagnosis Date  . Anemia   . Anxiety   . Arthritis   . Bleeding from the nose Oct 2014  . CAD (coronary artery disease)    a. s/p rotational atherectomy and DES placement to LAD in 2012 b. low-risk NST in 08/2018  . Cataract of both eyes   . Chronic kidney disease   . Colon polyps   . Hyperlipidemia   . Hypertension    x 5-6 yrs.  Marland Kitchen LBBB (left bundle branch block)   . Renal insufficiency    Past Surgical History:  Procedure Laterality Date  . BREAST SURGERY     masses removed while she was pregnant  . CARDIAC CATHETERIZATION  06/20/2008   severe single  vessel high-grade stenosis of mid LAD at bifurcation of diagonal 1 and setpal perforator 1; diffuse coronary calcification of L coronary system; 50% stenosis of mid RCA (Dr. Jackie Plum)  . CATARACT EXTRACTION W/PHACO Right 10/10/2013   Procedure: CATARACT EXTRACTION PHACO AND INTRAOCULAR LENS PLACEMENT (IOC);  Surgeon: Tonny Branch, MD;  [facility]: [facility];  Service: Ophthalmology;  Laterality: Right;  CDE:11.96  . CATARACT EXTRACTION W/PHACO Left 11/18/2013   Procedure: CATARACT EXTRACTION PHACO AND INTRAOCULAR LENS PLACEMENT  (IOC);  Surgeon: Tonny Branch, MD;  Location: [facility];  Service: Ophthalmology;  Laterality: Left;  CDE 15.96  . COLONOSCOPY N/A 01/16/2013   Procedure: COLONOSCOPY;  Surgeon: Rogene Houston, MD;  [facility];  Service: Endoscopy;  Laterality: N/A;  1200  . CORONARY ANGIOPLASTY WITH STENT PLACEMENT  11/08/2010   complex high-speed rotational atherectomy of calcified LAD, diffusely disease LAD system (Dr. Corky Downs)  . EXCISION ORAL TUMOR Right 01/14/2014   Procedure: EXCISION OROPHARYNGEAL MASS;  Surgeon: Ascencion Dike, MD;  Location: [place];  Service: ENT;  Laterality: Right;  . NM MYOCAR PERF WALL MOTION  05/2011   lexiscan myoview; normal pattern of perfusion in all regions; post-stress EF 55%; low risk scan   . PARTIAL HYSTERECTOMY    . TONSILLECTOMY    . TRANSTHORACIC ECHOCARDIOGRAM  10/2010   EF 45-50%, mod conc LVH, grade 1 diastolic dysfunction, mildly calcified left AV cusp; calcified MV annulus     Current Meds  Medication Sig  . amLODipine (NORVASC) 10 MG tablet Take 5 mg by mouth daily.  Marland Kitchen aspirin 81 MG tablet Take 81 mg by mouth daily.   Marland Kitchen FLAXSEED, LINSEED, PO Take 1 capsule by mouth daily.  . furosemide (LASIX) 40 MG tablet Take 40 mg by mouth as needed.   . isosorbide mononitrate (IMDUR) 120 MG 24 hr tablet Take 1 tablet by mouth once daily  . metoprolol succinate (TOPROL-XL) 25 MG 24 hr tablet Take 1 tablet (25 mg total) by mouth 2 (two) times daily. Take with or immediately following a meal.  . rosuvastatin (CRESTOR) 5 MG tablet Take 1 tablet (5 mg total) by mouth daily.  . Vitamin D, Ergocalciferol, (DRISDOL) 50000 units CAPS capsule TAKE 1 CAPSULE BY MOUTH ONCE A WEEK PT TO TAKE 2000 UNITS OF VITAMIN D OVER THE COUNTER DAILY AFTER THIS RX IS FINISHED  . [DISCONTINUED] metoprolol succinate (TOPROL-XL) 25 MG 24 hr tablet Take 1 tablet (25 mg total) by mouth daily. Take with or immediately following a meal. (Patient taking differently: Take 12.5 mg by mouth  2 (two) times daily. Take with or immediately following a meal.)  . [DISCONTINUED] Multiple Vitamin (MULTIVITAMIN) tablet Take 1 tablet by mouth daily.  . [DISCONTINUED] nitroGLYCERIN (NITROSTAT) 0.4 MG SL tablet Place 1 tablet (0.4 mg total) under the tongue every 5 (five) minutes as needed for chest pain.   Current Facility-Administered Medications for the 07/16/19 encounter (Telemedicine) with Cindy Heritage, PA-C  Medication  . polyethylene glycol powder (GLYCOLAX/MIRALAX) container 255 g     Allergies:   Macrobid [nitrofurantoin]   Social History   Tobacco Use  . Smoking status: Never Smoker  . Smokeless tobacco: Never Used  Substance Use Topics  . Alcohol use: No  . Drug use: No     Family Hx: The patient's family history includes COPD in her daughter; Cancer in her father; Coronary artery disease in her sister; Heart Problems in her brother and daughter; Liver cancer in her mother.  There is no history of Colon cancer.  ROS:   Please see the history of present illness.     All other systems reviewed and are negative.   Prior CV studies:   The following studies were reviewed today:  Echocardiogram: 01/2015 Study Conclusions   - Left ventricle: The cavity size was normal. Wall thickness was  increased in a pattern of mild LVH. Systolic function was normal.  The estimated ejection fraction was in the range of 55% to 60%.  Wall motion was normal; there were no regional wall motion  abnormalities. Doppler parameters are consistent with abnormal  left ventricular relaxation (grade 1 diastolic dysfunction).  - Ventricular septum: Septal motion showed abnormal function and  dyssynergy.  - Aortic valve: Mildly calcified annulus. Trileaflet.  - Mitral valve: Mildly thickened leaflets . There was trivial  regurgitation.  - Left atrium: The atrium was mildly dilated.  - Right atrium: Central venous pressure (est): 3 mm Hg.  - Atrial septum: No defect or  patent foramen ovale was identified.  - Tricuspid valve: There was physiologic regurgitation.  - Pulmonary arteries: Systolic pressure could not be accurately  estimated.  - Pericardium, extracardiac: A small pericardial effusion was  identified circumferential to the heart.   Impressions:   - Mild LVH with LVEF 55-60% and grade 1 diastolic dysfunction.  Septal dyssynergy consistent with conduction abnormality. Mild  left atrial enlargement. Mildly sclerotic mitral and aortic  annulus. Small circumferential pericardial effusion noted.   NST: 08/2018  This is a low risk study.  Nuclear stress EF: 60%.  LBBB seen throughout study.  Mild, small fixed septal defect likely due to LBBB.    Labs/Other Tests and Data Reviewed:    EKG:  An ECG dated 03/15/2019 was personally reviewed today and demonstrated:  NSR, HR 83 with known LBBB.   Recent Labs: 03/14/2019: ALT 15; BUN 35; Creatinine, Ser 2.05; Hemoglobin 10.3; Platelets 131; Potassium 4.1; Sodium 134   Recent Lipid Panel Lab Results  Component Value Date/Time   CHOL 164 05/13/2014 08:46 AM   TRIG 118 05/13/2014 08:46 AM   HDL 46 05/13/2014 08:46 AM   CHOLHDL 3.6 05/13/2014 08:46 AM   LDLCALC 94 05/13/2014 08:46 AM    Wt Readings from Last 3 Encounters:  07/16/19 162 lb (73.5 kg)  05/08/19 162 lb (73.5 kg)  03/14/19 159 lb (72.1 kg)     Objective:    Vital Signs:  BP (!) 177/65   Pulse 68   Ht 5\' 2"  (1.575 m)   Wt 162 lb (73.5 kg)   BMI 29.63 kg/m    General: Pleasant female sounding in NAD Psych: Normal affect. Neuro: Alert and oriented X 3. Lungs:  Resp regular and unlabored while talking on the phone.   ASSESSMENT & PLAN:    1. Palpitations - she describes brief episodes of her heart fluttering which last for a few seconds and spontaneously resolve. She is on Toprol-XL 12.5mg  BID. We reviewed options in regards to a cardiac event monitor versus medical management and she prefers medication  changes initially. Will plan to titrate Toprol-XL to 25mg  BID. If symptoms do not improve within a few weeks, would then plan to proceed with a cardiac event monitor to rule-out any significant arrhythmias given her underlying conduction disease with known LBBB.   2. CAD - s/p rotational atherectomy and DES placement to LAD in 2012 with low-risk NST in 08/2018 as outlined above. She denies any recent exertional chest pain and breathing has  been at baseline. She has not utilized SL NTG recently.  - continue ASA, Imdur, statin and BB therapy.   3. HTN - BP was elevated when checked today but she does report being under increased stress. Will plan to titrate Toprol-XL as outlined above. I did encourage her to continue to follow readings at home. Continue Amlodipine 5 mg daily and Imdur 120mg  daily.  4. HLD - followed by PCP. Will request most recent labs. She remains on Crestor 5mg  daily.    COVID-19 Education: The signs and symptoms of COVID-19 were discussed with the patient and how to seek care for testing (follow up with PCP or arrange E-visit).  The importance of social distancing was discussed today.  Time:   Today, I have spent 21 minutes with the patient with telehealth technology discussing the above problems.     Medication Adjustments/Labs and Tests Ordered: Current medicines are reviewed at length with the patient today.  Concerns regarding medicines are outlined above.   Tests Ordered: No orders of the defined types were placed in this encounter.   Medication Changes: Meds ordered this encounter  Medications  . metoprolol succinate (TOPROL-XL) 25 MG 24 hr tablet    Sig: Take 1 tablet (25 mg total) by mouth 2 (two) times daily. Take with or immediately following a meal.    Dispense:  180 tablet    Refill:  3    Order Specific Question:   Supervising Provider    Answer:   Dorothy Spark [[id number]]    Follow Up:  In Person in 5 month(s)  Signed, Cindy Heritage,  PA-C  07/16/2019 7:25 PM    [facility]

## 2019-07-23 DIAGNOSIS — R809 Proteinuria, unspecified: Secondary | ICD-10-CM | POA: Diagnosis not present

## 2019-07-23 DIAGNOSIS — D631 Anemia in chronic kidney disease: Secondary | ICD-10-CM | POA: Diagnosis not present

## 2019-07-23 DIAGNOSIS — N184 Chronic kidney disease, stage 4 (severe): Secondary | ICD-10-CM | POA: Diagnosis not present

## 2019-07-23 DIAGNOSIS — Z79899 Other long term (current) drug therapy: Secondary | ICD-10-CM | POA: Diagnosis not present

## 2019-07-23 DIAGNOSIS — E559 Vitamin D deficiency, unspecified: Secondary | ICD-10-CM | POA: Diagnosis not present

## 2019-07-26 DIAGNOSIS — D631 Anemia in chronic kidney disease: Secondary | ICD-10-CM | POA: Diagnosis not present

## 2019-07-26 DIAGNOSIS — N184 Chronic kidney disease, stage 4 (severe): Secondary | ICD-10-CM | POA: Diagnosis not present

## 2019-07-26 DIAGNOSIS — I129 Hypertensive chronic kidney disease with stage 1 through stage 4 chronic kidney disease, or unspecified chronic kidney disease: Secondary | ICD-10-CM | POA: Diagnosis not present

## 2019-07-26 DIAGNOSIS — I5032 Chronic diastolic (congestive) heart failure: Secondary | ICD-10-CM | POA: Diagnosis not present

## 2019-07-26 DIAGNOSIS — R809 Proteinuria, unspecified: Secondary | ICD-10-CM | POA: Diagnosis not present

## 2019-08-07 ENCOUNTER — Ambulatory Visit: Payer: Medicare Other | Admitting: Urology

## 2019-08-29 DIAGNOSIS — I10 Essential (primary) hypertension: Secondary | ICD-10-CM | POA: Diagnosis not present

## 2019-08-29 DIAGNOSIS — K529 Noninfective gastroenteritis and colitis, unspecified: Secondary | ICD-10-CM | POA: Diagnosis not present

## 2019-09-01 ENCOUNTER — Other Ambulatory Visit: Payer: Self-pay | Admitting: Cardiovascular Disease

## 2019-09-03 DIAGNOSIS — I5032 Chronic diastolic (congestive) heart failure: Secondary | ICD-10-CM | POA: Diagnosis not present

## 2019-09-03 DIAGNOSIS — Z79899 Other long term (current) drug therapy: Secondary | ICD-10-CM | POA: Diagnosis not present

## 2019-09-03 DIAGNOSIS — I129 Hypertensive chronic kidney disease with stage 1 through stage 4 chronic kidney disease, or unspecified chronic kidney disease: Secondary | ICD-10-CM | POA: Diagnosis not present

## 2019-09-03 DIAGNOSIS — R809 Proteinuria, unspecified: Secondary | ICD-10-CM | POA: Diagnosis not present

## 2019-09-03 DIAGNOSIS — N184 Chronic kidney disease, stage 4 (severe): Secondary | ICD-10-CM | POA: Diagnosis not present

## 2019-09-06 DIAGNOSIS — I129 Hypertensive chronic kidney disease with stage 1 through stage 4 chronic kidney disease, or unspecified chronic kidney disease: Secondary | ICD-10-CM | POA: Diagnosis not present

## 2019-09-06 DIAGNOSIS — N184 Chronic kidney disease, stage 4 (severe): Secondary | ICD-10-CM | POA: Diagnosis not present

## 2019-09-09 ENCOUNTER — Other Ambulatory Visit (HOSPITAL_COMMUNITY)
Admission: RE | Admit: 2019-09-09 | Discharge: 2019-09-09 | Disposition: A | Discharge status: Home / Self Care | Payer: Medicare Other | Source: Ambulatory Visit | Attending: Urology | Admitting: Urology

## 2019-09-09 ENCOUNTER — Ambulatory Visit: Payer: Medicare Other | Admitting: Urology

## 2019-09-09 ENCOUNTER — Other Ambulatory Visit: Payer: Self-pay

## 2019-09-09 ENCOUNTER — Encounter: Payer: Self-pay | Admitting: Urology

## 2019-09-09 VITALS — BP 167/74 | HR 75 | Temp 97.7°F | Ht 62.0 in | Wt 159.0 lb

## 2019-09-09 DIAGNOSIS — N3021 Other chronic cystitis with hematuria: Secondary | ICD-10-CM

## 2019-09-09 LAB — POCT URINALYSIS DIPSTICK
Glucose, UA: NEGATIVE
Ketones, UA: NEGATIVE
Nitrite, UA: POSITIVE
Protein, UA: POSITIVE — AB
Spec Grav, UA: 1.025 (ref 1.010–1.025)
Urobilinogen, UA: NEGATIVE E.U./dL — AB
pH, UA: 5 (ref 5.0–8.0)

## 2019-09-09 MED ORDER — TRIMETHOPRIM 100 MG PO TABS: 100.0000 mg | ORAL_TABLET | Freq: Every day | ORAL | 11 refills | Status: DC

## 2019-09-09 MED ORDER — SULFAMETHOXAZOLE-TRIMETHOPRIM 800-160 MG PO TABS: 1.0000 | ORAL_TABLET | Freq: Two times a day (BID) | ORAL | 0 refills | Status: DC

## 2019-09-09 NOTE — Progress Notes (Signed)
09/09/2019 3:48 PM   [name] 04/02/1934 [id number]  Referring provider: Sharilyn Sites, MD [address] [place],  [place] [address]  dysuria  HPI: Ms [name] is a 86yo with a hx of recurrent UTI here for folowup. Last visit in 05/07/2020 grew klebsiella sensitive to bactrim. She has terminal dysuria. Urinary frequency, urgency and nocturia. She is using premarin 3x per week. UA today is concerning for infection   PMH: Past Medical History:  Diagnosis Date  . Anemia   . Anxiety   . Arthritis   . Bleeding from the nose Oct 2014  . CAD (coronary artery disease)    a. s/p rotational atherectomy and DES placement to LAD in 2012 b. low-risk NST in 08/2018  . Cataract of both eyes   . Chronic kidney disease   . Colon polyps   . Hyperlipidemia   . Hypertension    x 5-6 yrs.  Marland Kitchen LBBB (left bundle branch block)   . Renal insufficiency     Surgical History: Past Surgical History:  Procedure Laterality Date  . BREAST SURGERY     masses removed while she was pregnant  . CARDIAC CATHETERIZATION  06/20/2008   severe single vessel high-grade stenosis of mid LAD at bifurcation of diagonal 1 and setpal perforator 1; diffuse coronary calcification of L coronary system; 50% stenosis of mid RCA (Dr. Jackie Plum)  . CATARACT EXTRACTION W/PHACO Right 10/10/2013   Procedure: CATARACT EXTRACTION PHACO AND INTRAOCULAR LENS PLACEMENT (IOC);  Surgeon: Tonny Branch, MD;  [facility]: [facility];  Service: Ophthalmology;  Laterality: Right;  CDE:11.96  . CATARACT EXTRACTION W/PHACO Left 11/18/2013   Procedure: CATARACT EXTRACTION PHACO AND INTRAOCULAR LENS PLACEMENT (IOC);  Surgeon: Tonny Branch, MD;  Location: [facility];  Service: Ophthalmology;  Laterality: Left;  CDE 15.96  . COLONOSCOPY N/A 01/16/2013   Procedure: COLONOSCOPY;  Surgeon: Rogene Houston, MD;  [facility];  Service: Endoscopy;  Laterality: N/A;  1200  . CORONARY ANGIOPLASTY WITH STENT PLACEMENT  11/08/2010   complex high-speed  rotational atherectomy of calcified LAD, diffusely disease LAD system (Dr. Corky Downs)  . EXCISION ORAL TUMOR Right 01/14/2014   Procedure: EXCISION OROPHARYNGEAL MASS;  Surgeon: Ascencion Dike, MD;  Location: [place];  Service: ENT;  Laterality: Right;  . NM MYOCAR PERF WALL MOTION  05/2011   lexiscan myoview; normal pattern of perfusion in all regions; post-stress EF 55%; low risk scan   . PARTIAL HYSTERECTOMY    . TONSILLECTOMY    . TRANSTHORACIC ECHOCARDIOGRAM  10/2010   EF 45-50%, mod conc LVH, grade 1 diastolic dysfunction, mildly calcified left AV cusp; calcified MV annulus    Home Medications:  Allergies as of 09/09/2019      Reactions   Macrobid [nitrofurantoin] Nausea And Vomiting      Medication List       Accurate as of Sep 09, 2019  3:48 PM. If you have any questions, ask your nurse or doctor.        STOP taking these medications   rosuvastatin 5 MG tablet Commonly known as: CRESTOR Stopped by: Nicolette Bang, MD     TAKE these medications   amLODipine 10 MG tablet Commonly known as: NORVASC Take 5 mg by mouth daily.   aspirin 81 MG tablet Take 81 mg by mouth daily.   FLAXSEED (LINSEED) PO Take 1 capsule by mouth daily.   furosemide 40 MG tablet Commonly known as: LASIX Take 40 mg by mouth as needed.  isosorbide mononitrate 120 MG 24 hr tablet Commonly known as: IMDUR Take 1 tablet by mouth once daily   metoprolol succinate 25 MG 24 hr tablet Commonly known as: TOPROL-XL Take 1 tablet (25 mg total) by mouth 2 (two) times daily. Take with or immediately following a meal.   nitroGLYCERIN 0.4 MG SL tablet Commonly known as: Nitrostat Place 1 tablet (0.4 mg total) under the tongue every 5 (five) minutes as needed for chest pain.   Vitamin D (Ergocalciferol) 1.25 MG (50000 UNIT) Caps capsule Commonly known as: DRISDOL TAKE 1 CAPSULE BY MOUTH ONCE A WEEK PT TO TAKE 2000 UNITS OF VITAMIN D OVER THE COUNTER DAILY AFTER THIS RX IS FINISHED         Allergies:  Allergies  Allergen Reactions  . Macrobid [Nitrofurantoin] Nausea And Vomiting    Family History: Family History  Problem Relation Age of Onset  . Liver cancer Mother   . Cancer Father   . Heart Problems Brother   . Coronary artery disease Sister        stent  . Heart Problems Daughter        also stomach problems  . COPD Daughter   . Colon cancer Neg Hx     Social History:  reports that she has never smoked. She has never used smokeless tobacco. She reports that she does not drink alcohol or use drugs.  ROS: All other review of systems were reviewed and are negative except what is noted above in HPI  Physical Exam: BP (!) 167/74   Pulse 75   Temp 97.7 F (36.5 C)   Ht 5\' 2"  (1.575 m)   Wt 159 lb (72.1 kg)   BMI 29.08 kg/m   Constitutional:  Alert and oriented, No acute distress. HEENT: [place] AT, moist mucus membranes.  Trachea midline, no masses. Cardiovascular: No clubbing, cyanosis, or edema. Respiratory: Normal respiratory effort, no increased work of breathing. GI: Abdomen is soft, nontender, nondistended, no abdominal masses GU: No CVA tenderness.  Lymph: No cervical or inguinal lymphadenopathy. Skin: No rashes, bruises or suspicious lesions. Neurologic: Grossly intact, no focal deficits, moving all 4 extremities. Psychiatric: Normal mood and affect.  Laboratory Data: Lab Results  Component Value Date   WBC 10.5 03/14/2019   HGB 10.3 (L) 03/14/2019   HCT 32.1 (L) 03/14/2019   MCV 102.9 (H) 03/14/2019   PLT 131 (L) 03/14/2019    Lab Results  Component Value Date   CREATININE 2.05 (H) 03/14/2019    No results found for: PSA  No results found for: TESTOSTERONE  No results found for: HGBA1C  Urinalysis    Component Value Date/Time   COLORURINE YELLOW 03/15/2019 0246   APPEARANCEUR HAZY (A) 03/15/2019 0246   LABSPEC 1.013 03/15/2019 0246   PHURINE 5.0 03/15/2019 0246   GLUCOSEU 50 (A) 03/15/2019 0246   HGBUR NEGATIVE 03/15/2019  0246   BILIRUBINUR moderate 09/09/2019 1533   KETONESUR 20 (A) 03/15/2019 0246   PROTEINUR Positive (A) 09/09/2019 1533   PROTEINUR 30 (A) 03/15/2019 0246   UROBILINOGEN negative (A) 09/09/2019 1533   NITRITE positive 09/09/2019 1533   NITRITE NEGATIVE 03/15/2019 0246   LEUKOCYTESUR Large (3+) (A) 09/09/2019 1533   LEUKOCYTESUR TRACE (A) 03/15/2019 0246    Lab Results  Component Value Date   BACTERIA FEW (A) 03/15/2019    Pertinent Imaging:  No results found for this or any previous visit. No results found for this or any previous visit. No results found for this or any  previous visit. No results found for this or any previous visit. Results for orders placed during the hospital encounter of 02/14/18  US RENAL   Narrative CLINICAL DATA:  Chronic stage 3 kidney disease.  EXAM: RENAL / URINARY TRACT ULTRASOUND COMPLETE  COMPARISON:  CT scan 12/19/2016  FINDINGS: Right Kidney:  Length: 8.1 cm. Mild renal cortical thinning but normal echogenicity. No focal lesions or hydronephrosis. No obvious renal calculi.  Left Kidney:  Length: 9.3 cm. Mild renal cortical thinning but normal echogenicity. No focal lesions or hydronephrosis. No definite renal calculi.  Bladder:  Appears normal for degree of bladder distention.  IMPRESSION: Mild renal cortical thinning but normal echogenicity without focal lesion or hydronephrosis.   Electronically Signed   By: Marijo Sanes M.D.   On: 02/14/2018 16:30    No results found for this or any previous visit. No results found for this or any previous visit. No results found for this or any previous visit.  Assessment & Plan:    1. Chronic cystitis with hematuria -urine for culture, will call with results -bactrim DS BID for 7 days. If culture is sensitive to bactrim then we will start trimethoprim 100mg  qhs - POCT urinalysis dipstick   No follow-ups on file.  Nicolette Bang, MD  [facility] [place]

## 2019-09-09 NOTE — Patient Instructions (Signed)
Urinary Tract Infection, Adult A urinary tract infection (UTI) is an infection of any part of the urinary tract. The urinary tract includes:  The kidneys.  The ureters.  The bladder.  The urethra. These organs make, store, and get rid of pee (urine) in the body. What are the causes? This is caused by germs (bacteria) in your genital area. These germs grow and cause swelling (inflammation) of your urinary tract. What increases the risk? You are more likely to develop this condition if:  You have a small, thin tube (catheter) to drain pee.  You cannot control when you pee or poop (incontinence).  You are female, and: ? You use these methods to prevent pregnancy:  A medicine that kills sperm (spermicide).  A device that blocks sperm (diaphragm). ? You have low levels of a female hormone (estrogen). ? You are pregnant.  You have genes that add to your risk.  You are sexually active.  You take antibiotic medicines.  You have trouble peeing because of: ? A prostate that is bigger than normal, if you are female. ? A blockage in the part of your body that drains pee from the bladder (urethra). ? A kidney stone. ? A nerve condition that affects your bladder (neurogenic bladder). ? Not getting enough to drink. ? Not peeing often enough.  You have other conditions, such as: ? Diabetes. ? A weak disease-fighting system (immune system). ? Sickle cell disease. ? Gout. ? Injury of the spine. What are the signs or symptoms? Symptoms of this condition include:  Needing to pee right away (urgently).  Peeing often.  Peeing small amounts often.  Pain or burning when peeing.  Blood in the pee.  Pee that smells bad or not like normal.  Trouble peeing.  Pee that is cloudy.  Fluid coming from the vagina, if you are female.  Pain in the belly or lower back. Other symptoms include:  Throwing up (vomiting).  No urge to eat.  Feeling mixed up (confused).  Being tired  and grouchy (irritable).  A fever.  Watery poop (diarrhea). How is this treated? This condition may be treated with:  Antibiotic medicine.  Other medicines.  Drinking enough water. Follow these instructions at home:  Medicines  Take over-the-counter and prescription medicines only as told by your doctor.  If you were prescribed an antibiotic medicine, take it as told by your doctor. Do not stop taking it even if you start to feel better. General instructions  Make sure you: ? Pee until your bladder is empty. ? Do not hold pee for a long time. ? Empty your bladder after sex. ? Wipe from front to back after pooping if you are a female. Use each tissue one time when you wipe.  Drink enough fluid to keep your pee pale yellow.  Keep all follow-up visits as told by your doctor. This is important. Contact a doctor if:  You do not get better after 1-2 days.  Your symptoms go away and then come back. Get help right away if:  You have very bad back pain.  You have very bad pain in your lower belly.  You have a fever.  You are sick to your stomach (nauseous).  You are throwing up. Summary  A urinary tract infection (UTI) is an infection of any part of the urinary tract.  This condition is caused by germs in your genital area.  There are many risk factors for a UTI. These include having a small, thin   tube to drain pee and not being able to control when you pee or poop.  Treatment includes antibiotic medicines for germs.  Drink enough fluid to keep your pee pale yellow. This information is not intended to replace advice given to you by your health care provider. Make sure you discuss any questions you have with your health care provider. Document Revised: 04/12/2018 Document Reviewed: 11/02/2017 Elsevier Patient Education  2020 Elsevier Inc.  

## 2019-09-09 NOTE — Progress Notes (Signed)
Urological Symptom Review  Patient is experiencing the following symptoms: Burning/pain with urination Get up at night to urinate Urinary tract infection   Review of Systems  Gastrointestinal (upper)  : Negative for upper GI symptoms  Gastrointestinal (lower) : Negative for lower GI symptoms  Constitutional : Negative for symptoms  Skin: Negative for skin symptoms  Eyes: Negative for eye symptoms  Ear/Nose/Throat : Negative for Ear/Nose/Throat symptoms  Hematologic/Lymphatic: Negative for Hematologic/Lymphatic symptoms  Cardiovascular : Negative for cardiovascular symptoms  Respiratory : Negative for respiratory symptoms  Endocrine: Negative for endocrine symptoms  Musculoskeletal: Negative for musculoskeletal symptoms  Neurological: Negative for neurological symptoms  Psychologic: Negative for psychiatric symptoms

## 2019-09-11 LAB — URINE CULTURE: Culture: >=100000 — AB

## 2019-09-13 ENCOUNTER — Telehealth: Payer: Self-pay | Admitting: Urology

## 2019-09-13 ENCOUNTER — Other Ambulatory Visit: Payer: Self-pay

## 2019-09-13 NOTE — Telephone Encounter (Signed)
Discussed urine culture results with Dr. Alyson Ingles. Pt was given Bactrim DS BID #14 while in office on 5/3. Notified pt to continue antibiotic. Appropriate based on culture.  Pt reports pharmacy said trimethoprim is unavailable until the end of June

## 2019-09-13 NOTE — Telephone Encounter (Signed)
Pt called for her urine results.

## 2019-09-30 ENCOUNTER — Other Ambulatory Visit (HOSPITAL_COMMUNITY)
Admission: RE | Admit: 2019-09-30 | Discharge: 2019-09-30 | Disposition: A | Discharge status: Home / Self Care | Payer: Medicare Other | Source: Other Acute Inpatient Hospital | Attending: Urology | Admitting: Urology

## 2019-09-30 ENCOUNTER — Telehealth: Payer: Self-pay | Admitting: Urology

## 2019-09-30 ENCOUNTER — Other Ambulatory Visit: Payer: Self-pay

## 2019-09-30 ENCOUNTER — Ambulatory Visit: Payer: Medicare Other

## 2019-09-30 DIAGNOSIS — N3021 Other chronic cystitis with hematuria: Secondary | ICD-10-CM

## 2019-09-30 MED ORDER — METHENAMINE HIPPURATE 1 G PO TABS: 1.0000 g | ORAL_TABLET | Freq: Every day | ORAL | 11 refills | Status: DC

## 2019-09-30 MED ORDER — SULFAMETHOXAZOLE-TRIMETHOPRIM 800-160 MG PO TABS: 1.0000 | ORAL_TABLET | Freq: Two times a day (BID) | ORAL | 0 refills | Status: DC

## 2019-09-30 NOTE — Telephone Encounter (Signed)
FYI-  Pt reports frequency and pain that started a few days ago. Reports trimethoprim that Dr. Alyson Ingles wished to place pt on is on back order until end of June. Pt will come by office today and leave sample

## 2019-09-30 NOTE — Telephone Encounter (Signed)
Pt requests a call back from the nurse. She is burning and having issues.

## 2019-09-30 NOTE — Progress Notes (Signed)
Pt came for urine drop off. Sample taken to lab

## 2019-10-02 LAB — URINE CULTURE: Culture: >=100000 — AB

## 2019-10-11 ENCOUNTER — Telehealth: Payer: Self-pay

## 2019-10-11 ENCOUNTER — Other Ambulatory Visit: Payer: Self-pay

## 2019-10-11 DIAGNOSIS — N3021 Other chronic cystitis with hematuria: Secondary | ICD-10-CM

## 2019-10-11 MED ORDER — CEPHALEXIN 500 MG PO CAPS: 500.0000 mg | ORAL_CAPSULE | Freq: Three times a day (TID) | ORAL | 0 refills | Status: DC

## 2019-10-11 NOTE — Telephone Encounter (Signed)
Prescription sent in. Left message for pt.

## 2019-10-11 NOTE — Telephone Encounter (Signed)
-----   Message from Irine Seal, MD sent at 10/11/2019  2:54 PM EDT ----- Now resistant to sulfa.   Keflex 500mg  po tid #21.  ----- Message ----- From: Dorisann Frames, RN Sent: 10/11/2019   2:35 PM EDT To: Irine Seal, MD  Urine culture

## 2019-10-29 DIAGNOSIS — N184 Chronic kidney disease, stage 4 (severe): Secondary | ICD-10-CM | POA: Diagnosis not present

## 2019-10-29 DIAGNOSIS — R809 Proteinuria, unspecified: Secondary | ICD-10-CM | POA: Diagnosis not present

## 2019-10-29 DIAGNOSIS — Z79899 Other long term (current) drug therapy: Secondary | ICD-10-CM | POA: Diagnosis not present

## 2019-10-29 DIAGNOSIS — I5032 Chronic diastolic (congestive) heart failure: Secondary | ICD-10-CM | POA: Diagnosis not present

## 2019-10-29 DIAGNOSIS — I129 Hypertensive chronic kidney disease with stage 1 through stage 4 chronic kidney disease, or unspecified chronic kidney disease: Secondary | ICD-10-CM | POA: Diagnosis not present

## 2019-11-06 ENCOUNTER — Other Ambulatory Visit: Payer: Self-pay | Admitting: Cardiovascular Disease

## 2019-11-06 DIAGNOSIS — D631 Anemia in chronic kidney disease: Secondary | ICD-10-CM | POA: Diagnosis not present

## 2019-11-06 DIAGNOSIS — I5032 Chronic diastolic (congestive) heart failure: Secondary | ICD-10-CM | POA: Diagnosis not present

## 2019-11-06 DIAGNOSIS — R809 Proteinuria, unspecified: Secondary | ICD-10-CM | POA: Diagnosis not present

## 2019-11-06 DIAGNOSIS — N184 Chronic kidney disease, stage 4 (severe): Secondary | ICD-10-CM | POA: Diagnosis not present

## 2019-11-06 DIAGNOSIS — I129 Hypertensive chronic kidney disease with stage 1 through stage 4 chronic kidney disease, or unspecified chronic kidney disease: Secondary | ICD-10-CM | POA: Diagnosis not present

## 2019-11-06 NOTE — Telephone Encounter (Signed)
Pt questions why her amlodipine was stopped   Please call [phone]   Thanks renee

## 2019-11-07 MED ORDER — AMLODIPINE BESYLATE 10 MG PO TABS: 5.0000 mg | ORAL_TABLET | Freq: Every day | ORAL | 3 refills | Status: DC

## 2019-11-07 NOTE — Telephone Encounter (Signed)
Pt notified that Amlodipine was not stopped according to last office visit. Refill sent to pharmacy.

## 2019-11-13 DIAGNOSIS — Z0000 Encounter for general adult medical examination without abnormal findings: Secondary | ICD-10-CM | POA: Diagnosis not present

## 2019-11-13 DIAGNOSIS — E785 Hyperlipidemia, unspecified: Secondary | ICD-10-CM | POA: Diagnosis not present

## 2019-11-13 DIAGNOSIS — M1991 Primary osteoarthritis, unspecified site: Secondary | ICD-10-CM | POA: Diagnosis not present

## 2019-11-13 DIAGNOSIS — N184 Chronic kidney disease, stage 4 (severe): Secondary | ICD-10-CM | POA: Diagnosis not present

## 2019-11-13 DIAGNOSIS — D510 Vitamin B12 deficiency anemia due to intrinsic factor deficiency: Secondary | ICD-10-CM | POA: Diagnosis not present

## 2019-11-13 DIAGNOSIS — Z1389 Encounter for screening for other disorder: Secondary | ICD-10-CM | POA: Diagnosis not present

## 2019-11-13 DIAGNOSIS — I10 Essential (primary) hypertension: Secondary | ICD-10-CM | POA: Diagnosis not present

## 2019-11-13 DIAGNOSIS — D638 Anemia in other chronic diseases classified elsewhere: Secondary | ICD-10-CM | POA: Diagnosis not present

## 2019-11-13 DIAGNOSIS — R7309 Other abnormal glucose: Secondary | ICD-10-CM | POA: Diagnosis not present

## 2019-11-27 ENCOUNTER — Ambulatory Visit (HOSPITAL_COMMUNITY)
Admission: RE | Admit: 2019-11-27 | Discharge: 2019-11-27 | Disposition: A | Discharge status: Home / Self Care | Payer: Medicare Other | Source: Ambulatory Visit | Attending: Family Medicine | Admitting: Family Medicine

## 2019-11-27 ENCOUNTER — Other Ambulatory Visit: Payer: Self-pay

## 2019-11-27 DIAGNOSIS — Z1231 Encounter for screening mammogram for malignant neoplasm of breast: Secondary | ICD-10-CM

## 2019-12-11 ENCOUNTER — Encounter: Payer: Self-pay | Admitting: Urology

## 2019-12-11 ENCOUNTER — Other Ambulatory Visit: Payer: Self-pay

## 2019-12-11 ENCOUNTER — Ambulatory Visit (INDEPENDENT_AMBULATORY_CARE_PROVIDER_SITE_OTHER): Payer: Medicare Other | Admitting: Urology

## 2019-12-11 ENCOUNTER — Ambulatory Visit: Payer: Medicare Other | Admitting: Urology

## 2019-12-11 VITALS — BP 174/66 | HR 75 | Temp 98.2°F | Ht 62.0 in | Wt 159.0 lb

## 2019-12-11 DIAGNOSIS — N3021 Other chronic cystitis with hematuria: Secondary | ICD-10-CM | POA: Diagnosis not present

## 2019-12-11 LAB — URINALYSIS, ROUTINE W REFLEX MICROSCOPIC
Bilirubin, UA: NEGATIVE
Glucose, UA: NEGATIVE
Ketones, UA: NEGATIVE
Leukocytes,UA: NEGATIVE
Nitrite, UA: NEGATIVE
RBC, UA: NEGATIVE
Specific Gravity, UA: 1.01 (ref 1.005–1.030)
Urobilinogen, Ur: 0.2 mg/dL (ref 0.2–1.0)
pH, UA: 5 (ref 5.0–7.5)

## 2019-12-11 MED ORDER — CEPHALEXIN 250 MG PO CAPS: 250.0000 mg | ORAL_CAPSULE | Freq: Every day | ORAL | 3 refills | Status: DC

## 2019-12-11 NOTE — Progress Notes (Signed)
Urological Symptom Review  Patient is experiencing the following symptoms: Frequent urination Hard to postpone urination Burning/pain with urination Get up at night to urinate Painful intercourse Weak stream   Review of Systems  Gastrointestinal (upper)  : Negative for upper GI symptoms  Gastrointestinal (lower) : Negative for lower GI symptoms  Constitutional : Fatigue  Skin: Negative for skin symptoms  Eyes: Negative for eye symptoms  Ear/Nose/Throat : Negative for Ear/Nose/Throat symptoms  Hematologic/Lymphatic: Negative for Hematologic/Lymphatic symptoms  Cardiovascular : Leg swelling  Respiratory : Shortness of breath  Endocrine: Negative for endocrine symptoms  Musculoskeletal: Back pain Joint pain  Neurological: Negative for neurological symptoms  Psychologic: Anxiety

## 2019-12-11 NOTE — Progress Notes (Signed)
12/11/2019 2:38 PM   [name] Jul 03, 1933 [id number]  Referring provider: Sharilyn Sites, MD [address] [place],  [place] [address]  Recurrent UTI  HPI: [name] is a 86yo here for followup for recurrent UTI. She is currently on hiprex 1g daily. She had a UTI in 09/2019 treated with keflex. UA today is normal. NO dysuria. No hematuria   PMH: Past Medical History:  Diagnosis Date  . Anemia   . Anxiety   . Arthritis   . Bleeding from the nose Oct 2014  . CAD (coronary artery disease)    a. s/p rotational atherectomy and DES placement to LAD in 2012 b. low-risk NST in 08/2018  . Cataract of both eyes   . Chronic kidney disease   . Colon polyps   . Hyperlipidemia   . Hypertension    x 5-6 yrs.  Marland Kitchen LBBB (left bundle Love block)   . Renal insufficiency     Surgical History: Past Surgical History:  Procedure Laterality Date  . BREAST SURGERY     masses removed while she was pregnant  . CARDIAC CATHETERIZATION  06/20/2008   severe single vessel high-grade stenosis of mid LAD at bifurcation of diagonal 1 and setpal perforator 1; diffuse coronary calcification of L coronary system; 50% stenosis of mid RCA (Dr. Jackie Plum)  . CATARACT EXTRACTION W/PHACO Right 10/10/2013   Procedure: CATARACT EXTRACTION PHACO AND INTRAOCULAR LENS PLACEMENT (IOC);  Surgeon: Cindy Branch, MD;  Location: [facility];  Service: Ophthalmology;  Laterality: Right;  CDE:11.96  . CATARACT EXTRACTION W/PHACO Left 11/18/2013   Procedure: CATARACT EXTRACTION PHACO AND INTRAOCULAR LENS PLACEMENT (IOC);  Surgeon: Cindy Branch, MD;  Location: [facility];  Service: Ophthalmology;  Laterality: Left;  CDE 15.96  . COLONOSCOPY N/A 01/16/2013   Procedure: COLONOSCOPY;  Surgeon: Cindy Houston, MD;  [facility]: [facility];  Service: Endoscopy;  Laterality: N/A;  1200  . CORONARY ANGIOPLASTY WITH STENT PLACEMENT  11/08/2010   complex high-speed rotational atherectomy of calcified LAD, diffusely disease LAD system (Dr. Corky Downs)  . EXCISION ORAL TUMOR Right 01/14/2014   Procedure: EXCISION OROPHARYNGEAL MASS;  Surgeon: Cindy Dike, MD;  Location: [facility];  Service: ENT;  Laterality: Right;  . NM MYOCAR PERF WALL MOTION  05/2011   lexiscan myoview; normal pattern of perfusion in all regions; post-stress EF 55%; low risk scan   . PARTIAL HYSTERECTOMY    . TONSILLECTOMY    . TRANSTHORACIC ECHOCARDIOGRAM  10/2010   EF 45-50%, mod conc LVH, grade 1 diastolic dysfunction, mildly calcified left AV cusp; calcified MV annulus    Home Medications:  Allergies as of 12/11/2019      Reactions   Macrobid [nitrofurantoin] Nausea And Vomiting      Medication List       Accurate as of December 11, 2019  2:38 PM. If you have any questions, ask your nurse or doctor.        STOP taking these medications   sulfamethoxazole-trimethoprim 800-160 MG tablet Commonly known as: BACTRIM DS Stopped by: Nicolette Bang, MD   trimethoprim 100 MG tablet Commonly known as: TRIMPEX Stopped by: Nicolette Bang, MD     TAKE these medications   amLODipine 10 MG tablet Commonly known as: NORVASC Take 0.5 tablets (5 mg total) by mouth daily.   aspirin 81 MG tablet Take 81 mg by mouth daily.   cephALEXin 500 MG capsule Commonly known as: Keflex Take 1 capsule (500 mg total) by mouth  3 (three) times daily.   FLAXSEED (LINSEED) PO Take 1 capsule by mouth daily.   furosemide 40 MG tablet Commonly known as: LASIX Take 40 mg by mouth as needed.   isosorbide mononitrate 120 MG 24 hr tablet Commonly known as: IMDUR Take 1 tablet by mouth once daily   methenamine 1 g tablet Commonly known as: HIPREX Take 1 tablet (1 g total) by mouth daily.   metoprolol succinate 25 MG 24 hr tablet Commonly known as: TOPROL-XL Take 1 tablet (25 mg total) by mouth 2 (two) times daily. Take with or immediately following a meal.   nitroGLYCERIN 0.4 MG SL tablet Commonly known as: Nitrostat Place 1 tablet (0.4 mg total) under  the tongue every 5 (five) minutes as needed for chest pain.   rosuvastatin 5 MG tablet Commonly known as: CRESTOR Take 5 mg by mouth daily.   Vitamin D (Ergocalciferol) 1.25 MG (50000 UNIT) Caps capsule Commonly known as: DRISDOL TAKE 1 CAPSULE BY MOUTH ONCE A WEEK PT TO TAKE 2000 UNITS OF VITAMIN D OVER THE COUNTER DAILY AFTER THIS RX IS FINISHED       Allergies:  Allergies  Allergen Reactions  . Macrobid [Nitrofurantoin] Nausea And Vomiting    Family History: Family History  Problem Relation Age of Onset  . Liver cancer Mother   . Cancer Father   . Heart Problems Brother   . Coronary artery disease Sister        stent  . Heart Problems Daughter        also stomach problems  . COPD Daughter   . Colon cancer Neg Hx     Social History:  reports that she has never smoked. She has never used smokeless tobacco. She reports that she does not drink alcohol and does not use drugs.  ROS: All other review of systems were reviewed and are negative except what is noted above in HPI  Physical Exam: BP (!) 174/66   Pulse 75   Temp 98.2 F (36.8 C)   Ht 5\' 2"  (1.575 m)   Wt 159 lb (72.1 kg)   BMI 29.08 kg/m   Constitutional:  Alert and oriented, No acute distress. HEENT: [place] AT, moist mucus membranes.  Trachea midline, no masses. Cardiovascular: No clubbing, cyanosis, or edema. Respiratory: Normal respiratory effort, no increased work of breathing. GI: Abdomen is soft, nontender, nondistended, no abdominal masses GU: No CVA tenderness.  Lymph: No cervical or inguinal lymphadenopathy. Skin: No rashes, bruises or suspicious lesions. Neurologic: Grossly intact, no focal deficits, moving all 4 extremities. Psychiatric: Normal mood and affect.  Laboratory Data: Lab Results  Component Value Date   WBC 10.5 03/14/2019   HGB 10.3 (L) 03/14/2019   HCT 32.1 (L) 03/14/2019   MCV 102.9 (H) 03/14/2019   PLT 131 (L) 03/14/2019    Lab Results  Component Value Date   CREATININE  2.05 (H) 03/14/2019    No results found for: PSA  No results found for: TESTOSTERONE  No results found for: HGBA1C  Urinalysis    Component Value Date/Time   COLORURINE YELLOW 03/15/2019 0246   APPEARANCEUR Clear 12/11/2019 1403   LABSPEC 1.013 03/15/2019 0246   PHURINE 5.0 03/15/2019 0246   GLUCOSEU Negative 12/11/2019 1403   HGBUR NEGATIVE 03/15/2019 0246   BILIRUBINUR Negative 12/11/2019 1403   KETONESUR 20 (A) 03/15/2019 0246   PROTEINUR Trace (A) 12/11/2019 1403   PROTEINUR 30 (A) 03/15/2019 0246   UROBILINOGEN negative (A) 09/09/2019 1533   NITRITE Negative 12/11/2019  [place] 03/15/2019 0246   LEUKOCYTESUR Negative 12/11/2019 1403   LEUKOCYTESUR TRACE (A) 03/15/2019 0246    Lab Results  Component Value Date   LABMICR Comment 12/11/2019   BACTERIA FEW (A) 03/15/2019    Pertinent Imaging:  No results found for this or any previous visit.  No results found for this or any previous visit.  No results found for this or any previous visit.  No results found for this or any previous visit.  Results for orders placed during the hospital encounter of 02/14/18  US RENAL  Narrative CLINICAL DATA:  Chronic stage 3 kidney disease.  EXAM: RENAL / URINARY TRACT ULTRASOUND COMPLETE  COMPARISON:  CT scan 12/19/2016  FINDINGS: Right Kidney:  Length: 8.1 cm. Mild renal cortical thinning but normal echogenicity. No focal lesions or hydronephrosis. No obvious renal calculi.  Left Kidney:  Length: 9.3 cm. Mild renal cortical thinning but normal echogenicity. No focal lesions or hydronephrosis. No definite renal calculi.  Bladder:  Appears normal for degree of bladder distention.  IMPRESSION: Mild renal cortical thinning but normal echogenicity without focal lesion or hydronephrosis.   Electronically Signed By: Marijo Sanes M.D. On: 02/14/2018 16:30  No results found for this or any previous visit.  No results found for this or any  previous visit.  No results found for this or any previous visit.   Assessment & Plan:    1. Chronic cystitis with hematuria -We will switch prophylaxis to 250mg  keflex daily -RTC 3 months - Urinalysis, Routine w reflex microscopic   No follow-ups on file.  Nicolette Bang, MD  [facility] [place]

## 2019-12-11 NOTE — Patient Instructions (Signed)
Urinary Tract Infection, Adult A urinary tract infection (UTI) is an infection of any part of the urinary tract. The urinary tract includes:  The kidneys.  The ureters.  The bladder.  The urethra. These organs make, store, and get rid of pee (urine) in the body. What are the causes? This is caused by germs (bacteria) in your genital area. These germs grow and cause swelling (inflammation) of your urinary tract. What increases the risk? You are more likely to develop this condition if:  You have a small, thin tube (catheter) to drain pee.  You cannot control when you pee or poop (incontinence).  You are female, and: ? You use these methods to prevent pregnancy:  A medicine that kills sperm (spermicide).  A device that blocks sperm (diaphragm). ? You have low levels of a female hormone (estrogen). ? You are pregnant.  You have genes that add to your risk.  You are sexually active.  You take antibiotic medicines.  You have trouble peeing because of: ? A prostate that is bigger than normal, if you are female. ? A blockage in the part of your body that drains pee from the bladder (urethra). ? A kidney stone. ? A nerve condition that affects your bladder (neurogenic bladder). ? Not getting enough to drink. ? Not peeing often enough.  You have other conditions, such as: ? Diabetes. ? A weak disease-fighting system (immune system). ? Sickle cell disease. ? Gout. ? Injury of the spine. What are the signs or symptoms? Symptoms of this condition include:  Needing to pee right away (urgently).  Peeing often.  Peeing small amounts often.  Pain or burning when peeing.  Blood in the pee.  Pee that smells bad or not like normal.  Trouble peeing.  Pee that is cloudy.  Fluid coming from the vagina, if you are female.  Pain in the belly or lower back. Other symptoms include:  Throwing up (vomiting).  No urge to eat.  Feeling mixed up (confused).  Being tired  and grouchy (irritable).  A fever.  Watery poop (diarrhea). How is this treated? This condition may be treated with:  Antibiotic medicine.  Other medicines.  Drinking enough water. Follow these instructions at home:  Medicines  Take over-the-counter and prescription medicines only as told by your doctor.  If you were prescribed an antibiotic medicine, take it as told by your doctor. Do not stop taking it even if you start to feel better. General instructions  Make sure you: ? Pee until your bladder is empty. ? Do not hold pee for a long time. ? Empty your bladder after sex. ? Wipe from front to back after pooping if you are a female. Use each tissue one time when you wipe.  Drink enough fluid to keep your pee pale yellow.  Keep all follow-up visits as told by your doctor. This is important. Contact a doctor if:  You do not get better after 1-2 days.  Your symptoms go away and then come back. Get help right away if:  You have very bad back pain.  You have very bad pain in your lower belly.  You have a fever.  You are sick to your stomach (nauseous).  You are throwing up. Summary  A urinary tract infection (UTI) is an infection of any part of the urinary tract.  This condition is caused by germs in your genital area.  There are many risk factors for a UTI. These include having a small, thin   tube to drain pee and not being able to control when you pee or poop.  Treatment includes antibiotic medicines for germs.  Drink enough fluid to keep your pee pale yellow. This information is not intended to replace advice given to you by your health care provider. Make sure you discuss any questions you have with your health care provider. Document Revised: 04/12/2018 Document Reviewed: 11/02/2017 Elsevier Patient Education  2020 Elsevier Inc.  

## 2019-12-23 ENCOUNTER — Ambulatory Visit: Payer: Medicare Other | Admitting: Cardiovascular Disease

## 2020-01-01 DIAGNOSIS — D631 Anemia in chronic kidney disease: Secondary | ICD-10-CM | POA: Diagnosis not present

## 2020-01-01 DIAGNOSIS — I5032 Chronic diastolic (congestive) heart failure: Secondary | ICD-10-CM | POA: Diagnosis not present

## 2020-01-01 DIAGNOSIS — N184 Chronic kidney disease, stage 4 (severe): Secondary | ICD-10-CM | POA: Diagnosis not present

## 2020-01-01 DIAGNOSIS — R809 Proteinuria, unspecified: Secondary | ICD-10-CM | POA: Diagnosis not present

## 2020-01-01 DIAGNOSIS — I129 Hypertensive chronic kidney disease with stage 1 through stage 4 chronic kidney disease, or unspecified chronic kidney disease: Secondary | ICD-10-CM | POA: Diagnosis not present

## 2020-01-08 DIAGNOSIS — I129 Hypertensive chronic kidney disease with stage 1 through stage 4 chronic kidney disease, or unspecified chronic kidney disease: Secondary | ICD-10-CM | POA: Diagnosis not present

## 2020-01-08 DIAGNOSIS — D631 Anemia in chronic kidney disease: Secondary | ICD-10-CM | POA: Diagnosis not present

## 2020-01-08 DIAGNOSIS — I5032 Chronic diastolic (congestive) heart failure: Secondary | ICD-10-CM | POA: Diagnosis not present

## 2020-01-08 DIAGNOSIS — R809 Proteinuria, unspecified: Secondary | ICD-10-CM | POA: Diagnosis not present

## 2020-01-08 DIAGNOSIS — N184 Chronic kidney disease, stage 4 (severe): Secondary | ICD-10-CM | POA: Diagnosis not present

## 2020-01-21 ENCOUNTER — Other Ambulatory Visit: Payer: Self-pay

## 2020-01-21 ENCOUNTER — Encounter: Payer: Self-pay | Admitting: Cardiology

## 2020-01-21 ENCOUNTER — Ambulatory Visit: Payer: Medicare Other | Admitting: Cardiology

## 2020-01-21 VITALS — BP 152/80 | HR 66 | Ht 62.0 in | Wt 163.0 lb

## 2020-01-21 DIAGNOSIS — R002 Palpitations: Secondary | ICD-10-CM

## 2020-01-21 DIAGNOSIS — I2510 Atherosclerotic heart disease of native coronary artery without angina pectoris: Secondary | ICD-10-CM

## 2020-01-21 DIAGNOSIS — I10 Essential (primary) hypertension: Secondary | ICD-10-CM | POA: Diagnosis not present

## 2020-01-21 DIAGNOSIS — E782 Mixed hyperlipidemia: Secondary | ICD-10-CM | POA: Diagnosis not present

## 2020-01-21 MED ORDER — AMLODIPINE BESYLATE 10 MG PO TABS: 10.0000 mg | ORAL_TABLET | Freq: Every day | ORAL | 3 refills | Status: DC

## 2020-01-21 NOTE — Patient Instructions (Signed)
Medication Instructions:  INCREASE Amlodipine to 10 mg daily   *If you need a refill on your cardiac medications before your next appointment, please call your pharmacy*   Lab Work: None today If you have labs (blood work) drawn today and your tests are completely normal, you will receive your results only by: Marland Kitchen MyChart Message (if you have MyChart) OR . A paper copy in the mail If you have any lab test that is abnormal or we need to change your treatment, we will call you to review the results.   Testing/Procedures: None today   Follow-Up: At [facility], you and your health needs are our priority.  As part of our continuing mission to provide you with exceptional heart care, we have created designated Provider Care Teams.  These Care Teams include your primary Cardiologist (physician) and Advanced Practice Providers (APPs -  Physician Assistants and Nurse Practitioners) who all work together to provide you with the care you need, when you need it.  We recommend signing up for the patient portal called "MyChart".  Sign up information is provided on this After Visit Summary.  MyChart is used to connect with patients for Virtual Visits (Telemedicine).  Patients are able to view lab/test results, encounter notes, upcoming appointments, etc.  Non-urgent messages can be sent to your provider as well.   To learn more about what you can do with MyChart, go to [email or web address].    Your next appointment:   6 month(s)  The format for your next appointment:   In Person  Provider:   Carlyle Dolly, MD   Other Instructions None       Thank you for choosing [facility] !

## 2020-01-21 NOTE — Progress Notes (Signed)
Clinical Summary [name] is a 86 y.o.female seen today for follow up of the following medical problems.   1. CAD - history of PCI to LAD - underwent a low risk nuclear stress test on 08/29/2018, EF 60%.  - no recent chest pains  2. Chronic LBBB   3. HTN - history white coat HTN per her report, but recent home bp's have also been elevated 150s/60   4. Hyperlipidemia - compliant with meds - compliantw with crestor.  5. Palpitations - on toprol taking 25mg  bid - plan was for cardiac monitor if symptoms progressed - mild palpitations.   6. CKD IV - followed by Dr Theador Hawthorne    Past Medical History:  Diagnosis Date  . Anemia   . Anxiety   . Arthritis   . Bleeding from the nose Oct 2014  . CAD (coronary artery disease)    a. s/p rotational atherectomy and DES placement to LAD in 2012 b. low-risk NST in 08/2018  . Cataract of both eyes   . Chronic kidney disease   . Colon polyps   . Hyperlipidemia   . Hypertension    x 5-6 yrs.  Marland Kitchen LBBB (left bundle Zohair Epp block)   . Renal insufficiency      Allergies  Allergen Reactions  . Macrobid [Nitrofurantoin] Nausea And Vomiting     Current Outpatient Medications  Medication Sig Dispense Refill  . amLODipine (NORVASC) 10 MG tablet Take 0.5 tablets (5 mg total) by mouth daily. 30 tablet 3  . aspirin 81 MG tablet Take 81 mg by mouth daily.     . cephALEXin (KEFLEX) 250 MG capsule Take 1 capsule (250 mg total) by mouth at bedtime. 30 capsule 3  . FLAXSEED, LINSEED, PO Take 1 capsule by mouth daily. (Patient not taking: Reported on 12/11/2019)    . furosemide (LASIX) 40 MG tablet Take 40 mg by mouth as needed.     . isosorbide mononitrate (IMDUR) 120 MG 24 hr tablet Take 1 tablet by mouth once daily 90 tablet 3  . methenamine (HIPREX) 1 g tablet Take 1 tablet (1 g total) by mouth daily. 30 tablet 11  . metoprolol succinate (TOPROL-XL) 25 MG 24 hr tablet Take 1 tablet (25 mg total) by mouth 2 (two) times daily. Take with  or immediately following a meal. 180 tablet 3  . nitroGLYCERIN (NITROSTAT) 0.4 MG SL tablet Place 1 tablet (0.4 mg total) under the tongue every 5 (five) minutes as needed for chest pain. 25 tablet 3  . rosuvastatin (CRESTOR) 5 MG tablet Take 5 mg by mouth daily.    . Vitamin D, Ergocalciferol, (DRISDOL) 50000 units CAPS capsule TAKE 1 CAPSULE BY MOUTH ONCE A WEEK PT TO TAKE 2000 UNITS OF VITAMIN D OVER THE COUNTER DAILY AFTER THIS RX IS FINISHED  0   Current Facility-Administered Medications  Medication Dose Route Frequency Provider Last Rate Last Admin  . polyethylene glycol powder (GLYCOLAX/MIRALAX) container 255 g  1 Container Oral Once Jonnie Kind, MD         Past Surgical History:  Procedure Laterality Date  . BREAST SURGERY     masses removed while she was pregnant  . CARDIAC CATHETERIZATION  06/20/2008   severe single vessel high-grade stenosis of mid LAD at bifurcation of diagonal 1 and setpal perforator 1; diffuse coronary calcification of L coronary system; 50% stenosis of mid RCA (Dr. Jackie Plum)  . CATARACT EXTRACTION W/PHACO Right 10/10/2013   Procedure: CATARACT EXTRACTION PHACO  AND INTRAOCULAR LENS PLACEMENT (IOC);  Surgeon: Tonny Georgene Kopper, MD;  Location: [facility];  Service: Ophthalmology;  Laterality: Right;  CDE:11.96  . CATARACT EXTRACTION W/PHACO Left 11/18/2013   Procedure: CATARACT EXTRACTION PHACO AND INTRAOCULAR LENS PLACEMENT (IOC);  Surgeon: Tonny Tzippy Testerman, MD;  Location: [facility];  Service: Ophthalmology;  Laterality: Left;  CDE 15.96  . COLONOSCOPY N/A 01/16/2013   Procedure: COLONOSCOPY;  Surgeon: Rogene Houston, MD;  [facility];  Service: Endoscopy;  Laterality: N/A;  1200  . CORONARY ANGIOPLASTY WITH STENT PLACEMENT  11/08/2010   complex high-speed rotational atherectomy of calcified LAD, diffusely disease LAD system (Dr. Corky Downs)  . EXCISION ORAL TUMOR Right 01/14/2014   Procedure: EXCISION OROPHARYNGEAL MASS;  Surgeon: Ascencion Dike, MD;  Location: [facility];  Service: ENT;  Laterality: Right;  . NM MYOCAR PERF WALL MOTION  05/2011   lexiscan myoview; normal pattern of perfusion in all regions; post-stress EF 55%; low risk scan   . PARTIAL HYSTERECTOMY    . TONSILLECTOMY    . TRANSTHORACIC ECHOCARDIOGRAM  10/2010   EF 45-50%, mod conc LVH, grade 1 diastolic dysfunction, mildly calcified left AV cusp; calcified MV annulus     Allergies  Allergen Reactions  . Macrobid [Nitrofurantoin] Nausea And Vomiting      Family History  Problem Relation Age of Onset  . Liver cancer Mother   . Cancer Father   . Heart Problems Brother   . Coronary artery disease Sister        stent  . Heart Problems Daughter        also stomach problems  . COPD Daughter   . Colon cancer Neg Hx      Social History [name] reports that she has never smoked. She has never used smokeless tobacco. [name] reports no history of alcohol use.   Review of Systems CONSTITUTIONAL: No weight loss, fever, chills, weakness or fatigue.  HEENT: Eyes: No visual loss, blurred vision, double vision or yellow sclerae.No hearing loss, sneezing, congestion, runny nose or sore throat.  SKIN: No rash or itching.  CARDIOVASCULAR: per hpi RESPIRATORY: No shortness of breath, cough or sputum.  GASTROINTESTINAL: No anorexia, nausea, vomiting or diarrhea. No abdominal pain or blood.  GENITOURINARY: No burning on urination, no polyuria NEUROLOGICAL: No headache, dizziness, syncope, paralysis, ataxia, numbness or tingling in the extremities. No change in bowel or bladder control.  MUSCULOSKELETAL: No muscle, back pain, joint pain or stiffness.  LYMPHATICS: No enlarged nodes. No history of splenectomy.  PSYCHIATRIC: No history of depression or anxiety.  ENDOCRINOLOGIC: No reports of sweating, cold or heat intolerance. No polyuria or polydipsia.  Marland Kitchen   Physical Examination Today's Vitals   01/21/20 1410  BP: (!) 152/80  Pulse: 66  SpO2: 97%  Weight: 163 lb (73.9  kg)  Height: 5\' 2"  (1.575 m)   Body mass index is 29.81 kg/m.  Gen: resting comfortably, no acute distress HEENT: no scleral icterus, pupils equal round and reactive, no palptable cervical adenopathy,  CV: RRR, no mr/g, no jvd Resp: Clear to auscultation bilaterally GI: abdomen is soft, non-tender, non-distended, normal bowel sounds, no hepatosplenomegaly MSK: extremities are warm, no edema.  Skin: warm, no rash Neuro:  no focal deficits Psych: appropriate affect      Assessment and Plan  1. CAD - no symptoms, continue current meds  2. HTN - above goal, particuarly with her CKD IV - increase norvasc to 10mg  daily  3. Hyperlipidemia - request  labs from pcp  4. Palpitations - overall controlled, continue beta blocker  F/u 6 months      Arnoldo Lenis, M.D.

## 2020-01-22 ENCOUNTER — Encounter: Payer: Self-pay | Admitting: Cardiology

## 2020-01-23 DIAGNOSIS — M7731 Calcaneal spur, right foot: Secondary | ICD-10-CM | POA: Diagnosis not present

## 2020-01-23 DIAGNOSIS — M722 Plantar fascial fibromatosis: Secondary | ICD-10-CM | POA: Diagnosis not present

## 2020-01-23 DIAGNOSIS — M79671 Pain in right foot: Secondary | ICD-10-CM | POA: Diagnosis not present

## 2020-02-06 DIAGNOSIS — I129 Hypertensive chronic kidney disease with stage 1 through stage 4 chronic kidney disease, or unspecified chronic kidney disease: Secondary | ICD-10-CM | POA: Diagnosis not present

## 2020-02-06 DIAGNOSIS — N184 Chronic kidney disease, stage 4 (severe): Secondary | ICD-10-CM | POA: Diagnosis not present

## 2020-02-11 DIAGNOSIS — M722 Plantar fascial fibromatosis: Secondary | ICD-10-CM | POA: Diagnosis not present

## 2020-02-11 DIAGNOSIS — M7731 Calcaneal spur, right foot: Secondary | ICD-10-CM | POA: Diagnosis not present

## 2020-02-11 DIAGNOSIS — M79671 Pain in right foot: Secondary | ICD-10-CM | POA: Diagnosis not present

## 2020-02-25 ENCOUNTER — Other Ambulatory Visit: Payer: Self-pay | Admitting: *Deleted

## 2020-02-25 MED ORDER — ROSUVASTATIN CALCIUM 5 MG PO TABS: 5.0000 mg | ORAL_TABLET | Freq: Every day | ORAL | 1 refills | Status: DC

## 2020-03-10 DIAGNOSIS — M722 Plantar fascial fibromatosis: Secondary | ICD-10-CM | POA: Diagnosis not present

## 2020-03-10 DIAGNOSIS — I129 Hypertensive chronic kidney disease with stage 1 through stage 4 chronic kidney disease, or unspecified chronic kidney disease: Secondary | ICD-10-CM | POA: Diagnosis not present

## 2020-03-10 DIAGNOSIS — N184 Chronic kidney disease, stage 4 (severe): Secondary | ICD-10-CM | POA: Diagnosis not present

## 2020-03-10 DIAGNOSIS — M7731 Calcaneal spur, right foot: Secondary | ICD-10-CM | POA: Diagnosis not present

## 2020-03-10 DIAGNOSIS — I5032 Chronic diastolic (congestive) heart failure: Secondary | ICD-10-CM | POA: Diagnosis not present

## 2020-03-10 DIAGNOSIS — D631 Anemia in chronic kidney disease: Secondary | ICD-10-CM | POA: Diagnosis not present

## 2020-03-10 DIAGNOSIS — M79671 Pain in right foot: Secondary | ICD-10-CM | POA: Diagnosis not present

## 2020-03-10 DIAGNOSIS — M25579 Pain in unspecified ankle and joints of unspecified foot: Secondary | ICD-10-CM | POA: Diagnosis not present

## 2020-03-10 DIAGNOSIS — R809 Proteinuria, unspecified: Secondary | ICD-10-CM | POA: Diagnosis not present

## 2020-03-11 ENCOUNTER — Ambulatory Visit: Payer: Medicare Other | Admitting: Urology

## 2020-03-12 DIAGNOSIS — I129 Hypertensive chronic kidney disease with stage 1 through stage 4 chronic kidney disease, or unspecified chronic kidney disease: Secondary | ICD-10-CM | POA: Diagnosis not present

## 2020-03-12 DIAGNOSIS — N184 Chronic kidney disease, stage 4 (severe): Secondary | ICD-10-CM | POA: Diagnosis not present

## 2020-03-12 DIAGNOSIS — R809 Proteinuria, unspecified: Secondary | ICD-10-CM | POA: Diagnosis not present

## 2020-03-12 DIAGNOSIS — D631 Anemia in chronic kidney disease: Secondary | ICD-10-CM | POA: Diagnosis not present

## 2020-03-12 DIAGNOSIS — I5032 Chronic diastolic (congestive) heart failure: Secondary | ICD-10-CM | POA: Diagnosis not present

## 2020-04-07 DIAGNOSIS — N184 Chronic kidney disease, stage 4 (severe): Secondary | ICD-10-CM | POA: Diagnosis not present

## 2020-04-07 DIAGNOSIS — I129 Hypertensive chronic kidney disease with stage 1 through stage 4 chronic kidney disease, or unspecified chronic kidney disease: Secondary | ICD-10-CM | POA: Diagnosis not present

## 2020-05-08 DIAGNOSIS — N184 Chronic kidney disease, stage 4 (severe): Secondary | ICD-10-CM | POA: Diagnosis not present

## 2020-05-08 DIAGNOSIS — I129 Hypertensive chronic kidney disease with stage 1 through stage 4 chronic kidney disease, or unspecified chronic kidney disease: Secondary | ICD-10-CM | POA: Diagnosis not present

## 2020-05-18 DIAGNOSIS — N184 Chronic kidney disease, stage 4 (severe): Secondary | ICD-10-CM | POA: Diagnosis not present

## 2020-05-18 DIAGNOSIS — D631 Anemia in chronic kidney disease: Secondary | ICD-10-CM | POA: Diagnosis not present

## 2020-05-18 DIAGNOSIS — I129 Hypertensive chronic kidney disease with stage 1 through stage 4 chronic kidney disease, or unspecified chronic kidney disease: Secondary | ICD-10-CM | POA: Diagnosis not present

## 2020-05-18 DIAGNOSIS — N189 Chronic kidney disease, unspecified: Secondary | ICD-10-CM | POA: Diagnosis not present

## 2020-05-18 DIAGNOSIS — I5032 Chronic diastolic (congestive) heart failure: Secondary | ICD-10-CM | POA: Diagnosis not present

## 2020-05-20 DIAGNOSIS — N189 Chronic kidney disease, unspecified: Secondary | ICD-10-CM | POA: Diagnosis not present

## 2020-05-20 DIAGNOSIS — I5032 Chronic diastolic (congestive) heart failure: Secondary | ICD-10-CM | POA: Diagnosis not present

## 2020-05-20 DIAGNOSIS — R809 Proteinuria, unspecified: Secondary | ICD-10-CM | POA: Diagnosis not present

## 2020-05-20 DIAGNOSIS — D631 Anemia in chronic kidney disease: Secondary | ICD-10-CM | POA: Diagnosis not present

## 2020-05-20 DIAGNOSIS — N184 Chronic kidney disease, stage 4 (severe): Secondary | ICD-10-CM | POA: Diagnosis not present

## 2020-05-20 DIAGNOSIS — I129 Hypertensive chronic kidney disease with stage 1 through stage 4 chronic kidney disease, or unspecified chronic kidney disease: Secondary | ICD-10-CM | POA: Diagnosis not present

## 2020-05-28 DIAGNOSIS — L821 Other seborrheic keratosis: Secondary | ICD-10-CM | POA: Diagnosis not present

## 2020-06-05 DIAGNOSIS — M810 Age-related osteoporosis without current pathological fracture: Secondary | ICD-10-CM | POA: Diagnosis not present

## 2020-06-05 DIAGNOSIS — M1991 Primary osteoarthritis, unspecified site: Secondary | ICD-10-CM | POA: Diagnosis not present

## 2020-06-05 DIAGNOSIS — Z1389 Encounter for screening for other disorder: Secondary | ICD-10-CM | POA: Diagnosis not present

## 2020-06-05 DIAGNOSIS — I2510 Atherosclerotic heart disease of native coronary artery without angina pectoris: Secondary | ICD-10-CM | POA: Diagnosis not present

## 2020-06-05 DIAGNOSIS — D510 Vitamin B12 deficiency anemia due to intrinsic factor deficiency: Secondary | ICD-10-CM | POA: Diagnosis not present

## 2020-06-05 DIAGNOSIS — I10 Essential (primary) hypertension: Secondary | ICD-10-CM | POA: Diagnosis not present

## 2020-06-05 DIAGNOSIS — Z0001 Encounter for general adult medical examination with abnormal findings: Secondary | ICD-10-CM | POA: Diagnosis not present

## 2020-06-05 DIAGNOSIS — E7849 Other hyperlipidemia: Secondary | ICD-10-CM | POA: Diagnosis not present

## 2020-06-06 DIAGNOSIS — I129 Hypertensive chronic kidney disease with stage 1 through stage 4 chronic kidney disease, or unspecified chronic kidney disease: Secondary | ICD-10-CM | POA: Diagnosis not present

## 2020-06-06 DIAGNOSIS — N184 Chronic kidney disease, stage 4 (severe): Secondary | ICD-10-CM | POA: Diagnosis not present

## 2020-06-11 ENCOUNTER — Ambulatory Visit: Payer: Medicare Other | Admitting: Urology

## 2020-07-06 DIAGNOSIS — I129 Hypertensive chronic kidney disease with stage 1 through stage 4 chronic kidney disease, or unspecified chronic kidney disease: Secondary | ICD-10-CM | POA: Diagnosis not present

## 2020-07-06 DIAGNOSIS — N184 Chronic kidney disease, stage 4 (severe): Secondary | ICD-10-CM | POA: Diagnosis not present

## 2020-07-08 ENCOUNTER — Ambulatory Visit (INDEPENDENT_AMBULATORY_CARE_PROVIDER_SITE_OTHER): Payer: Medicare Other | Admitting: Urology

## 2020-07-08 ENCOUNTER — Encounter: Payer: Self-pay | Admitting: Urology

## 2020-07-08 ENCOUNTER — Other Ambulatory Visit: Payer: Self-pay

## 2020-07-08 VITALS — BP 143/68 | HR 84 | Temp 98.1°F | Ht 64.0 in | Wt 160.0 lb

## 2020-07-08 DIAGNOSIS — N3021 Other chronic cystitis with hematuria: Secondary | ICD-10-CM

## 2020-07-08 LAB — MICROSCOPIC EXAMINATION
Epithelial Cells (non renal): >10 /hpf — AB (ref 0–10)
Renal Epithel, UA: NONE SEEN /hpf

## 2020-07-08 LAB — URINALYSIS, ROUTINE W REFLEX MICROSCOPIC
Bilirubin, UA: NEGATIVE
Glucose, UA: NEGATIVE
Nitrite, UA: NEGATIVE
Specific Gravity, UA: 1.025 (ref 1.005–1.030)
Urobilinogen, Ur: 0.2 mg/dL (ref 0.2–1.0)
pH, UA: 5 (ref 5.0–7.5)

## 2020-07-08 MED ORDER — TRIMETHOPRIM 100 MG PO TABS: 100.0000 mg | ORAL_TABLET | Freq: Every day | ORAL | 11 refills | Status: DC

## 2020-07-08 NOTE — Progress Notes (Signed)
07/08/2020 3:17 PM   [name] [birth year] [id number]  Referring provider: Sharilyn Sites, [place] [address] [place],  [place] [address]  followup recurrent UTI  HPI: [name] is a 87yo here for followup for recurrent UTI. She Has has 2 UTI since last visit. She tried macrobid which caused nausea and vomiting. She denies any worsening LUTS. No other complaints today.    PMH: Past Medical History:  Diagnosis Date  . Anemia   . Anxiety   . Arthritis   . Bleeding from the nose Oct 2014  . CAD (coronary artery disease)    a. s/p rotational atherectomy and DES placement to LAD in 2012 b. low-risk NST in 08/2018  . Cataract of both eyes   . Chronic kidney disease   . Colon polyps   . Hyperlipidemia   . Hypertension    x 5-6 yrs.  Marland Kitchen LBBB (left bundle branch block)   . Renal insufficiency     Surgical History: Past Surgical History:  Procedure Laterality Date  . BREAST SURGERY     masses removed while she was pregnant  . CARDIAC CATHETERIZATION  06/20/2008   severe single vessel high-grade stenosis of mid LAD at bifurcation of diagonal 1 and setpal perforator 1; diffuse coronary calcification of L coronary system; 50% stenosis of mid RCA (Dr. Jackie Plum)  . CATARACT EXTRACTION W/PHACO Right 10/10/2013   Procedure: CATARACT EXTRACTION PHACO AND INTRAOCULAR LENS PLACEMENT (IOC);  Surgeon: Tonny Branch, MD;  Location: [facility];  Service: Ophthalmology;  Laterality: Right;  CDE:11.96  . CATARACT EXTRACTION W/PHACO Left 11/18/2013   Procedure: CATARACT EXTRACTION PHACO AND INTRAOCULAR LENS PLACEMENT (IOC);  Surgeon: Tonny Branch, MD;  Location: [facility];  Service: Ophthalmology;  Laterality: Left;  CDE 15.96  . COLONOSCOPY N/A 01/16/2013   Procedure: COLONOSCOPY;  Surgeon: Rogene Houston, MD;  [facility]: [facility];  Service: Endoscopy;  Laterality: N/A;  1200  . CORONARY ANGIOPLASTY WITH STENT PLACEMENT  11/08/2010   complex high-speed rotational atherectomy of calcified LAD, diffusely  disease LAD system (Dr. Corky Downs)  . EXCISION ORAL TUMOR Right 01/14/2014   Procedure: EXCISION OROPHARYNGEAL MASS;  Surgeon: Ascencion Dike, MD;  Location: [facility];  Service: ENT;  Laterality: Right;  . NM MYOCAR PERF WALL MOTION  05/2011   lexiscan myoview; normal pattern of perfusion in all regions; post-stress EF 55%; low risk scan   . PARTIAL HYSTERECTOMY    . TONSILLECTOMY    . TRANSTHORACIC ECHOCARDIOGRAM  10/2010   EF 45-50%, mod conc LVH, grade 1 diastolic dysfunction, mildly calcified left AV cusp; calcified MV annulus    Home Medications:  Allergies as of 07/08/2020      Reactions   Macrobid [nitrofurantoin] Nausea And Vomiting      Medication List       Accurate as of July 08, 2020  3:17 PM. If you have any questions, ask your nurse or doctor.        ALPRAZolam 0.5 MG tablet Commonly known as: XANAX Take 0.5 mg by mouth 3 (three) times daily as needed.   amLODipine 10 MG tablet Commonly known as: NORVASC Take 1 tablet (10 mg total) by mouth daily.   aspirin 81 MG tablet Take 81 mg by mouth daily.   cephALEXin 250 MG capsule Commonly known as: Keflex Take 1 capsule (250 mg total) by mouth at bedtime.   FLAXSEED (LINSEED) PO Take 1 capsule by mouth daily.   furosemide 40 MG tablet Commonly  known as: LASIX Take 40 mg by mouth as needed.   isosorbide mononitrate 120 MG 24 hr tablet Commonly known as: IMDUR Take 1 tablet by mouth once daily   metoprolol succinate 25 MG 24 hr tablet Commonly known as: TOPROL-XL Take 1 tablet (25 mg total) by mouth 2 (two) times daily. Take with or immediately following a meal.   nitroGLYCERIN 0.4 MG SL tablet Commonly known as: Nitrostat Place 1 tablet (0.4 mg total) under the tongue every 5 (five) minutes as needed for chest pain.   rosuvastatin 5 MG tablet Commonly known as: CRESTOR Take 1 tablet (5 mg total) by mouth daily.   Vitamin D (Ergocalciferol) 1.25 MG (50000 UNIT) Caps capsule Commonly known as:  DRISDOL TAKE 1 CAPSULE BY MOUTH ONCE A WEEK PT TO TAKE 2000 UNITS OF VITAMIN D OVER THE COUNTER DAILY AFTER THIS RX IS FINISHED       Allergies:  Allergies  Allergen Reactions  . Macrobid [Nitrofurantoin] Nausea And Vomiting    Family History: Family History  Problem Relation Age of Onset  . Liver cancer Mother   . Cancer Father   . Heart Problems Brother   . Coronary artery disease Sister        stent  . Heart Problems Daughter        also stomach problems  . COPD Daughter   . Colon cancer Neg Hx     Social History:  reports that she has never smoked. She has never used smokeless tobacco. She reports that she does not drink alcohol and does not use drugs.  ROS: All other review of systems were reviewed and are negative except what is noted above in HPI  Physical Exam: BP (!) 143/68   Pulse 84   Temp 98.1 F (36.7 C)   Ht 5\' 4"  (1.626 m)   Wt 160 lb (72.6 kg)   BMI 27.46 kg/m   Constitutional:  Alert and oriented, No acute distress. HEENT: [place] AT, moist mucus membranes.  Trachea midline, no masses. Cardiovascular: No clubbing, cyanosis, or edema. Respiratory: Normal respiratory effort, no increased work of breathing. GI: Abdomen is soft, nontender, nondistended, no abdominal masses GU: No CVA tenderness.  Lymph: No cervical or inguinal lymphadenopathy. Skin: No rashes, bruises or suspicious lesions. Neurologic: Grossly intact, no focal deficits, moving all 4 extremities. Psychiatric: Normal mood and affect.  Laboratory Data: Lab Results  Component Value Date   WBC 10.5 03/14/2019   HGB 10.3 (L) 03/14/2019   HCT 32.1 (L) 03/14/2019   MCV 102.9 (H) 03/14/2019   PLT 131 (L) 03/14/2019    Lab Results  Component Value Date   CREATININE 2.05 (H) 03/14/2019    No results found for: PSA  No results found for: TESTOSTERONE  No results found for: HGBA1C  Urinalysis    Component Value Date/Time   COLORURINE YELLOW 03/15/2019 0246   APPEARANCEUR Clear  12/11/2019 1403   LABSPEC 1.013 03/15/2019 0246   PHURINE 5.0 03/15/2019 0246   GLUCOSEU Negative 12/11/2019 1403   HGBUR NEGATIVE 03/15/2019 0246   BILIRUBINUR Negative 12/11/2019 1403   KETONESUR 20 (A) 03/15/2019 0246   PROTEINUR Trace (A) 12/11/2019 1403   PROTEINUR 30 (A) 03/15/2019 0246   UROBILINOGEN negative (A) 09/09/2019 1533   NITRITE Negative 12/11/2019 1403   NITRITE NEGATIVE 03/15/2019 0246   LEUKOCYTESUR Negative 12/11/2019 1403   LEUKOCYTESUR TRACE (A) 03/15/2019 0246    Lab Results  Component Value Date   LABMICR Comment 12/11/2019   BACTERIA FEW (  A) 03/15/2019    Pertinent Imaging:  No results found for this or any previous visit.  No results found for this or any previous visit.  No results found for this or any previous visit.  No results found for this or any previous visit.  Results for orders placed during the hospital encounter of 02/14/18  US RENAL  Narrative CLINICAL DATA:  Chronic stage 3 kidney disease.  EXAM: RENAL / URINARY TRACT ULTRASOUND COMPLETE  COMPARISON:  CT scan 12/19/2016  FINDINGS: Right Kidney:  Length: 8.1 cm. Mild renal cortical thinning but normal echogenicity. No focal lesions or hydronephrosis. No obvious renal calculi.  Left Kidney:  Length: 9.3 cm. Mild renal cortical thinning but normal echogenicity. No focal lesions or hydronephrosis. No definite renal calculi.  Bladder:  Appears normal for degree of bladder distention.  IMPRESSION: Mild renal cortical thinning but normal echogenicity without focal lesion or hydronephrosis.   Electronically Signed By: Marijo Sanes M.D. On: 02/14/2018 16:30  No results found for this or any previous visit.  No results found for this or any previous visit.  No results found for this or any previous visit.   Assessment & Plan:    1. Chronic cystitis with hematuria -we will switch to trimethoprim 100mg   -RTC 3 months - Urinalysis, Routine w reflex  microscopic   No follow-ups on file.  Nicolette Bang, MD  [facility] [place]

## 2020-07-08 NOTE — Progress Notes (Signed)
Urological Symptom Review  Patient is experiencing the following symptoms: Frequent urination Hard to postpone urination Burning/pain with urination Get up at night to urinate Urinary tract infection   Review of Systems  Gastrointestinal (upper)  : Indigestion/heartburn  Gastrointestinal (lower) : Negative for lower GI symptoms  Constitutional : Fatigue  Skin: Negative for skin symptoms  Eyes: Negative for eye symptoms  Ear/Nose/Throat : Negative for Ear/Nose/Throat symptoms  Hematologic/Lymphatic: Negative for Hematologic/Lymphatic symptoms  Cardiovascular : Leg swelling  Respiratory : Cough Shortness of breath  Endocrine: Excessive thirst  Musculoskeletal: Back pain Joint pain  Neurological: Negative for neurological symptoms  Psychologic: Negative for psychiatric symptoms

## 2020-07-08 NOTE — Patient Instructions (Signed)
Urinary Tract Infection, Adult A urinary tract infection (UTI) is an infection of any part of the urinary tract. The urinary tract includes:  The kidneys.  The ureters.  The bladder.  The urethra. These organs make, store, and get rid of pee (urine) in the body. What are the causes? This infection is caused by germs (bacteria) in your genital area. These germs grow and cause swelling (inflammation) of your urinary tract. What increases the risk? The following factors may make you more likely to develop this condition:  Using a small, thin tube (catheter) to drain pee.  Not being able to control when you pee or poop (incontinence).  Being female. If you are female, these things can increase the risk: ? Using these methods to prevent pregnancy:  A medicine that kills sperm (spermicide).  A device that blocks sperm (diaphragm). ? Having low levels of a female hormone (estrogen). ? Being pregnant. You are more likely to develop this condition if:  You have genes that add to your risk.  You are sexually active.  You take antibiotic medicines.  You have trouble peeing because of: ? A prostate that is bigger than normal, if you are female. ? A blockage in the part of your body that drains pee from the bladder. ? A kidney stone. ? A nerve condition that affects your bladder. ? Not getting enough to drink. ? Not peeing often enough.  You have other conditions, such as: ? Diabetes. ? A weak disease-fighting system (immune system). ? Sickle cell disease. ? Gout. ? Injury of the spine. What are the signs or symptoms? Symptoms of this condition include:  Needing to pee right away.  Peeing small amounts often.  Pain or burning when peeing.  Blood in the pee.  Pee that smells bad or not like normal.  Trouble peeing.  Pee that is cloudy.  Fluid coming from the vagina, if you are female.  Pain in the belly or lower back. Other symptoms include:  Vomiting.  Not  feeling hungry.  Feeling mixed up (confused). This may be the first symptom in older adults.  Being tired and grouchy (irritable).  A fever.  Watery poop (diarrhea). How is this treated?  Taking antibiotic medicine.  Taking other medicines.  Drinking enough water. In some cases, you may need to see a specialist. Follow these instructions at home: Medicines  Take over-the-counter and prescription medicines only as told by your doctor.  If you were prescribed an antibiotic medicine, take it as told by your doctor. Do not stop taking it even if you start to feel better. General instructions  Make sure you: ? Pee until your bladder is empty. ? Do not hold pee for a long time. ? Empty your bladder after sex. ? Wipe from front to back after peeing or pooping if you are a female. Use each tissue one time when you wipe.  Drink enough fluid to keep your pee pale yellow.  Keep all follow-up visits.   Contact a doctor if:  You do not get better after 1-2 days.  Your symptoms go away and then come back. Get help right away if:  You have very bad back pain.  You have very bad pain in your lower belly.  You have a fever.  You have chills.  You feeling like you will vomit or you vomit. Summary  A urinary tract infection (UTI) is an infection of any part of the urinary tract.  This condition is caused by   germs in your genital area.  There are many risk factors for a UTI.  Treatment includes antibiotic medicines.  Drink enough fluid to keep your pee pale yellow. This information is not intended to replace advice given to you by your health care provider. Make sure you discuss any questions you have with your health care provider. Document Revised: 12/06/2019 Document Reviewed: 12/06/2019 Elsevier Patient Education  2021 Elsevier Inc.  

## 2020-07-10 LAB — URINE CULTURE

## 2020-07-14 ENCOUNTER — Other Ambulatory Visit: Payer: Self-pay

## 2020-07-15 ENCOUNTER — Telehealth: Payer: Self-pay

## 2020-07-15 ENCOUNTER — Other Ambulatory Visit: Payer: Self-pay

## 2020-07-15 DIAGNOSIS — N390 Urinary tract infection, site not specified: Secondary | ICD-10-CM

## 2020-07-15 MED ORDER — SULFAMETHOXAZOLE-TRIMETHOPRIM 800-160 MG PO TABS: 1.0000 | ORAL_TABLET | Freq: Two times a day (BID) | ORAL | 0 refills | Status: DC

## 2020-07-15 NOTE — Progress Notes (Signed)
Entered in error

## 2020-07-15 NOTE — Telephone Encounter (Signed)
Pt notified of positive culture and antibiotic sent into pharmacy.

## 2020-07-15 NOTE — Telephone Encounter (Signed)
-----   Message from Cleon Gustin, MD sent at 07/14/2020 10:05 AM EST ----- Macrobid 100mg  BID for 7 days ----- Message ----- From: Valentina Lucks, LPN Sent: 12/07/1884   4:19 PM EST To: Cleon Gustin, MD  Pls review.

## 2020-07-20 DIAGNOSIS — I5032 Chronic diastolic (congestive) heart failure: Secondary | ICD-10-CM | POA: Diagnosis not present

## 2020-07-20 DIAGNOSIS — N184 Chronic kidney disease, stage 4 (severe): Secondary | ICD-10-CM | POA: Diagnosis not present

## 2020-07-20 DIAGNOSIS — D631 Anemia in chronic kidney disease: Secondary | ICD-10-CM | POA: Diagnosis not present

## 2020-07-20 DIAGNOSIS — N189 Chronic kidney disease, unspecified: Secondary | ICD-10-CM | POA: Diagnosis not present

## 2020-07-20 DIAGNOSIS — I129 Hypertensive chronic kidney disease with stage 1 through stage 4 chronic kidney disease, or unspecified chronic kidney disease: Secondary | ICD-10-CM | POA: Diagnosis not present

## 2020-07-24 DIAGNOSIS — I129 Hypertensive chronic kidney disease with stage 1 through stage 4 chronic kidney disease, or unspecified chronic kidney disease: Secondary | ICD-10-CM | POA: Diagnosis not present

## 2020-07-24 DIAGNOSIS — I5032 Chronic diastolic (congestive) heart failure: Secondary | ICD-10-CM | POA: Diagnosis not present

## 2020-07-24 DIAGNOSIS — D631 Anemia in chronic kidney disease: Secondary | ICD-10-CM | POA: Diagnosis not present

## 2020-07-24 DIAGNOSIS — N184 Chronic kidney disease, stage 4 (severe): Secondary | ICD-10-CM | POA: Diagnosis not present

## 2020-07-24 DIAGNOSIS — E872 Acidosis: Secondary | ICD-10-CM | POA: Diagnosis not present

## 2020-07-24 DIAGNOSIS — N189 Chronic kidney disease, unspecified: Secondary | ICD-10-CM | POA: Diagnosis not present

## 2020-07-24 DIAGNOSIS — E211 Secondary hyperparathyroidism, not elsewhere classified: Secondary | ICD-10-CM | POA: Diagnosis not present

## 2020-07-29 ENCOUNTER — Ambulatory Visit: Payer: Medicare Other | Admitting: Cardiology

## 2020-07-29 ENCOUNTER — Other Ambulatory Visit: Payer: Self-pay

## 2020-07-29 ENCOUNTER — Encounter: Payer: Self-pay | Admitting: Cardiology

## 2020-07-29 VITALS — BP 148/78 | HR 65 | Ht 61.5 in | Wt 162.0 lb

## 2020-07-29 DIAGNOSIS — R002 Palpitations: Secondary | ICD-10-CM

## 2020-07-29 DIAGNOSIS — I2510 Atherosclerotic heart disease of native coronary artery without angina pectoris: Secondary | ICD-10-CM

## 2020-07-29 DIAGNOSIS — I10 Essential (primary) hypertension: Secondary | ICD-10-CM | POA: Diagnosis not present

## 2020-07-29 NOTE — Patient Instructions (Signed)

## 2020-07-29 NOTE — Progress Notes (Signed)
Clinical Summary [name] is a 87 y.o.female seen today for follow up of the following medical problems.   1. CAD - history of PCI to LAD - underwent a low risk nuclear stress test on 08/29/2018, EF 60%.  - no recent chest pains.  - compliant with meds  2. Chronic LBBB   3. HTN - compliant with meds  4. Hyperlipidemia - labs followed by pcp.  5. Palpitations - on toprol taking 25mg  bid - plan was for cardiac monitor if symptoms progressed  - daily palpitations, lasts just a few seconds. Overall not bothersome   6. CKD IV - followed by Dr Theador Hawthorne    Past Medical History:  Diagnosis Date  . Anemia   . Anxiety   . Arthritis   . Bleeding from the nose Oct 2014  . CAD (coronary artery disease)    a. s/p rotational atherectomy and DES placement to LAD in 2012 b. low-risk NST in 08/2018  . Cataract of both eyes   . Chronic kidney disease   . Colon polyps   . Hyperlipidemia   . Hypertension    x 5-6 yrs.  Marland Kitchen LBBB (left bundle [name] block)   . Renal insufficiency      Allergies  Allergen Reactions  . Macrobid [Nitrofurantoin] Nausea And Vomiting     Current Outpatient Medications  Medication Sig Dispense Refill  . ALPRAZolam (XANAX) 0.5 MG tablet Take 0.5 mg by mouth 3 (three) times daily as needed.    Marland Kitchen amLODipine (NORVASC) 10 MG tablet Take 1 tablet (10 mg total) by mouth daily. 90 tablet 3  . aspirin 81 MG tablet Take 81 mg by mouth daily.     . cephALEXin (KEFLEX) 250 MG capsule Take 1 capsule (250 mg total) by mouth at bedtime. 30 capsule 3  . FLAXSEED, LINSEED, PO Take 1 capsule by mouth daily.     . furosemide (LASIX) 40 MG tablet Take 40 mg by mouth as needed.     . isosorbide mononitrate (IMDUR) 120 MG 24 hr tablet Take 1 tablet by mouth once daily 90 tablet 3  . metoprolol succinate (TOPROL-XL) 25 MG 24 hr tablet Take 1 tablet (25 mg total) by mouth 2 (two) times daily. Take with or immediately following a meal. 180 tablet 3  .  nitroGLYCERIN (NITROSTAT) 0.4 MG SL tablet Place 1 tablet (0.4 mg total) under the tongue every 5 (five) minutes as needed for chest pain. 25 tablet 3  . rosuvastatin (CRESTOR) 5 MG tablet Take 1 tablet (5 mg total) by mouth daily. 90 tablet 1  . sulfamethoxazole-trimethoprim (BACTRIM DS) 800-160 MG tablet Take 1 tablet by mouth every 12 (twelve) hours. 14 tablet 0  . trimethoprim (TRIMPEX) 100 MG tablet Take 1 tablet (100 mg total) by mouth at bedtime. 30 tablet 11  . Vitamin D, Ergocalciferol, (DRISDOL) 50000 units CAPS capsule TAKE 1 CAPSULE BY MOUTH ONCE A WEEK PT TO TAKE 2000 UNITS OF VITAMIN D OVER THE COUNTER DAILY AFTER THIS RX IS FINISHED  0   Current Facility-Administered Medications  Medication Dose Route Frequency Provider Last Rate Last Admin  . polyethylene glycol powder (GLYCOLAX/MIRALAX) container 255 g  1 Container Oral Once Jonnie Kind, MD         Past Surgical History:  Procedure Laterality Date  . BREAST SURGERY     masses removed while she was pregnant  . CARDIAC CATHETERIZATION  06/20/2008   severe single vessel high-grade stenosis of mid LAD  at bifurcation of diagonal 1 and setpal perforator 1; diffuse coronary calcification of L coronary system; 50% stenosis of mid RCA (Dr. Jackie Plum)  . CATARACT EXTRACTION W/PHACO Right 10/10/2013   Procedure: CATARACT EXTRACTION PHACO AND INTRAOCULAR LENS PLACEMENT (IOC);  Surgeon: Tonny Rayce Brahmbhatt, MD;  Location: [facility];  Service: Ophthalmology;  Laterality: Right;  CDE:11.96  . CATARACT EXTRACTION W/PHACO Left 11/18/2013   Procedure: CATARACT EXTRACTION PHACO AND INTRAOCULAR LENS PLACEMENT (IOC);  Surgeon: Tonny Nicki Gracy, MD;  Location: [facility];  Service: Ophthalmology;  Laterality: Left;  CDE 15.96  . COLONOSCOPY N/A 01/16/2013   Procedure: COLONOSCOPY;  Surgeon: Rogene Houston, MD;  [facility];  Service: Endoscopy;  Laterality: N/A;  1200  . CORONARY ANGIOPLASTY WITH STENT PLACEMENT  11/08/2010   complex high-speed rotational  atherectomy of calcified LAD, diffusely disease LAD system (Dr. Corky Downs)  . EXCISION ORAL TUMOR Right 01/14/2014   Procedure: EXCISION OROPHARYNGEAL MASS;  Surgeon: Ascencion Dike, MD;  Location: [facility];  Service: ENT;  Laterality: Right;  . NM MYOCAR PERF WALL MOTION  05/2011   lexiscan myoview; normal pattern of perfusion in all regions; post-stress EF 55%; low risk scan   . PARTIAL HYSTERECTOMY    . TONSILLECTOMY    . TRANSTHORACIC ECHOCARDIOGRAM  10/2010   EF 45-50%, mod conc LVH, grade 1 diastolic dysfunction, mildly calcified left AV cusp; calcified MV annulus     Allergies  Allergen Reactions  . Macrobid [Nitrofurantoin] Nausea And Vomiting      Family History  Problem Relation Age of Onset  . Liver cancer Mother   . Cancer Father   . Heart Problems Brother   . Coronary artery disease Sister        stent  . Heart Problems Daughter        also stomach problems  . COPD Daughter   . Colon cancer Neg Hx      Social History Ms. Hallum reports that she has never smoked. She has never used smokeless tobacco. Ms. Maris reports no history of alcohol use.   Review of Systems CONSTITUTIONAL: No weight loss, fever, chills, weakness or fatigue.  HEENT: Eyes: No visual loss, blurred vision, double vision or yellow sclerae.No hearing loss, sneezing, congestion, runny nose or sore throat.  SKIN: No rash or itching.  CARDIOVASCULAR: per hpi RESPIRATORY: No shortness of breath, cough or sputum.  GASTROINTESTINAL: No anorexia, nausea, vomiting or diarrhea. No abdominal pain or blood.  GENITOURINARY: No burning on urination, no polyuria NEUROLOGICAL: No headache, dizziness, syncope, paralysis, ataxia, numbness or tingling in the extremities. No change in bowel or bladder control.  MUSCULOSKELETAL: No muscle, back pain, joint pain or stiffness.  LYMPHATICS: No enlarged nodes. No history of splenectomy.  PSYCHIATRIC: No history of depression or anxiety.  ENDOCRINOLOGIC:  No reports of sweating, cold or heat intolerance. No polyuria or polydipsia.  Marland Kitchen   Physical Examination Today's Vitals   07/29/20 1332  BP: (!) 148/78  Pulse: 65  SpO2: 97%  Weight: 162 lb (73.5 kg)  Height: 5' 1.5" (1.562 m)   Body mass index is 30.11 kg/m.  Gen: resting comfortably, no acute distress HEENT: no scleral icterus, pupils equal round and reactive, no palptable cervical adenopathy,  CV: RRR, no m/r/g no jvd Resp: Clear to auscultation bilaterally GI: abdomen is soft, non-tender, non-distended, normal bowel sounds, no hepatosplenomegaly MSK: extremities are warm, no edema.  Skin: warm, no rash Neuro:  no focal deficits Psych: appropriate affect  Diagnostic Studies     Assessment and Plan   1. CAD - no recent symptoms, continue current meds  2. HTN - 140s/70s in general - given advanced age reasonable bp at this time. Limited options given renal dysfunction. Could change her metoprolol to carvedilol or consider hydralazine in the future  3. Palpitaitons - mild symptoms, continue beta blocker  EKG today shows SR, chronic LBBB, 1st degre AV block   F/u 6 months  Arnoldo Lenis, M.D.

## 2020-08-15 ENCOUNTER — Other Ambulatory Visit: Payer: Self-pay | Admitting: Student

## 2020-08-17 NOTE — Telephone Encounter (Signed)
This is a [place] pt.  °

## 2020-08-28 ENCOUNTER — Other Ambulatory Visit: Payer: Self-pay

## 2020-08-28 MED ORDER — ISOSORBIDE MONONITRATE ER 120 MG PO TB24: 120.0000 mg | ORAL_TABLET | Freq: Every day | ORAL | 3 refills | Status: DC

## 2020-08-28 NOTE — Telephone Encounter (Signed)
Refilled imdur 120 mg qd, #90, RF:3 to [facility] [place]

## 2020-09-18 DIAGNOSIS — N189 Chronic kidney disease, unspecified: Secondary | ICD-10-CM | POA: Diagnosis not present

## 2020-09-18 DIAGNOSIS — D631 Anemia in chronic kidney disease: Secondary | ICD-10-CM | POA: Diagnosis not present

## 2020-09-18 DIAGNOSIS — I129 Hypertensive chronic kidney disease with stage 1 through stage 4 chronic kidney disease, or unspecified chronic kidney disease: Secondary | ICD-10-CM | POA: Diagnosis not present

## 2020-09-18 DIAGNOSIS — N184 Chronic kidney disease, stage 4 (severe): Secondary | ICD-10-CM | POA: Diagnosis not present

## 2020-09-18 DIAGNOSIS — I5032 Chronic diastolic (congestive) heart failure: Secondary | ICD-10-CM | POA: Diagnosis not present

## 2020-09-23 DIAGNOSIS — N189 Chronic kidney disease, unspecified: Secondary | ICD-10-CM | POA: Diagnosis not present

## 2020-09-23 DIAGNOSIS — R809 Proteinuria, unspecified: Secondary | ICD-10-CM | POA: Diagnosis not present

## 2020-09-23 DIAGNOSIS — D631 Anemia in chronic kidney disease: Secondary | ICD-10-CM | POA: Diagnosis not present

## 2020-09-23 DIAGNOSIS — I5032 Chronic diastolic (congestive) heart failure: Secondary | ICD-10-CM | POA: Diagnosis not present

## 2020-09-23 DIAGNOSIS — E870 Hyperosmolality and hypernatremia: Secondary | ICD-10-CM | POA: Diagnosis not present

## 2020-09-23 DIAGNOSIS — N184 Chronic kidney disease, stage 4 (severe): Secondary | ICD-10-CM | POA: Diagnosis not present

## 2020-09-23 DIAGNOSIS — I129 Hypertensive chronic kidney disease with stage 1 through stage 4 chronic kidney disease, or unspecified chronic kidney disease: Secondary | ICD-10-CM | POA: Diagnosis not present

## 2020-10-06 DIAGNOSIS — I129 Hypertensive chronic kidney disease with stage 1 through stage 4 chronic kidney disease, or unspecified chronic kidney disease: Secondary | ICD-10-CM | POA: Diagnosis not present

## 2020-10-06 DIAGNOSIS — N184 Chronic kidney disease, stage 4 (severe): Secondary | ICD-10-CM | POA: Diagnosis not present

## 2020-10-09 ENCOUNTER — Ambulatory Visit: Payer: Medicare Other | Admitting: Urology

## 2020-10-09 ENCOUNTER — Encounter: Payer: Self-pay | Admitting: Urology

## 2020-10-09 ENCOUNTER — Other Ambulatory Visit: Payer: Self-pay

## 2020-10-09 VITALS — Ht 61.5 in

## 2020-10-09 DIAGNOSIS — N3021 Other chronic cystitis with hematuria: Secondary | ICD-10-CM | POA: Diagnosis not present

## 2020-10-09 DIAGNOSIS — N390 Urinary tract infection, site not specified: Secondary | ICD-10-CM

## 2020-10-09 LAB — URINALYSIS, ROUTINE W REFLEX MICROSCOPIC
Bilirubin, UA: NEGATIVE
Glucose, UA: NEGATIVE
Ketones, UA: NEGATIVE
Nitrite, UA: POSITIVE — AB
Specific Gravity, UA: 1.02 (ref 1.005–1.030)
Urobilinogen, Ur: 0.2 mg/dL (ref 0.2–1.0)
pH, UA: 5.5 (ref 5.0–7.5)

## 2020-10-09 LAB — MICROSCOPIC EXAMINATION
Epithelial Cells (non renal): >10 /hpf — AB (ref 0–10)
Renal Epithel, UA: NONE SEEN /hpf
WBC, UA: >30 /hpf — AB (ref 0–5)

## 2020-10-09 MED ORDER — ESTROGENS, CONJUGATED 0.625 MG/GM VA CREA: 1.0000 | TOPICAL_CREAM | Freq: Every day | VAGINAL | 12 refills | Status: DC

## 2020-10-09 NOTE — Progress Notes (Signed)
Urological Symptom Review  Patient is experiencing the following symptoms: Frequent urination Hard to postpone urination Burning/pain with urination Get up at night to urinate Urinary tract infection   Review of Systems  Gastrointestinal (upper)  : Indigestion/heartburn  Gastrointestinal (lower) : Negative for lower GI symptoms  Constitutional : Negative for symptoms  Skin: Itching  Eyes: Negative for eye symptoms  Ear/Nose/Throat : Negative for Ear/Nose/Throat symptoms  Hematologic/Lymphatic: Negative for Hematologic/Lymphatic symptoms  Cardiovascular : Leg swelling  Respiratory : Cough  Endocrine: Negative for endocrine symptoms  Musculoskeletal: Back pain Joint pain  Neurological: Negative for neurological symptoms  Psychologic: Negative for psychiatric symptoms

## 2020-10-09 NOTE — Progress Notes (Signed)
10/09/2020 2:39 PM   [name] Sep 08, 1933 [id number]  Referring provider: Sharilyn Sites, MD [address] [place],   [address]  Recurrent UTI  HPI: [name] is a 87yo here for followup for recurrent. No UTIs since last visit. She uses trimethoprim daily. She has intermittent vaginal burning/discomfort. Urinary stream strong. NO urinary hesitancy. No feeling of incomplete emptying. No other complaints today.    PMH: Past Medical History:  Diagnosis Date   Anemia    Anxiety    Arthritis    Bleeding from the nose Oct 2014   CAD (coronary artery disease)    a. s/p rotational atherectomy and DES placement to LAD in 2012 b. low-risk NST in 08/2018   Cataract of both eyes    Chronic kidney disease    Colon polyps    Hyperlipidemia    Hypertension    x 5-6 yrs.   LBBB (left bundle branch block)    Renal insufficiency     Surgical History: Past Surgical History:  Procedure Laterality Date   BREAST SURGERY     masses removed while she was pregnant   CARDIAC CATHETERIZATION  06/20/2008   severe single vessel high-grade stenosis of mid LAD at bifurcation of diagonal 1 and setpal perforator 1; diffuse coronary calcification of L coronary system; 50% stenosis of mid RCA (Dr. Jackie Plum)   CATARACT EXTRACTION W/PHACO Right 10/10/2013   Procedure: CATARACT EXTRACTION PHACO AND INTRAOCULAR LENS PLACEMENT ([place]);  Surgeon: Tonny Branch, MD;  Location: [facility];  Service: Ophthalmology;  Laterality: Right;  CDE:11.96   CATARACT EXTRACTION W/PHACO Left 11/18/2013   Procedure: CATARACT EXTRACTION PHACO AND INTRAOCULAR LENS PLACEMENT ([facility]);  Surgeon: Tonny Branch, MD;  Location: [facility];  Service: Ophthalmology;  Laterality: Left;  CDE 15.96   COLONOSCOPY N/A 01/16/2013   Procedure: COLONOSCOPY;  Surgeon: Rogene Houston, MD;  Location: [facility];  Service: Endoscopy;  Laterality: N/A;  1200   CORONARY ANGIOPLASTY WITH STENT PLACEMENT  11/08/2010   complex high-speed rotational  atherectomy of calcified LAD, diffusely disease LAD system (Dr. Corky Downs)   EXCISION ORAL TUMOR Right 01/14/2014   Procedure: EXCISION OROPHARYNGEAL MASS;  Surgeon: Ascencion Dike, MD;  Location: [facility];  Service: ENT;  Laterality: Right;   NM MYOCAR [place]  05/2011   lexiscan myoview; normal pattern of perfusion in all regions; post-stress EF 55%; low risk scan    PARTIAL HYSTERECTOMY     TONSILLECTOMY     TRANSTHORACIC ECHOCARDIOGRAM  10/2010   EF 45-50%, mod conc LVH, grade 1 diastolic dysfunction, mildly calcified left AV cusp; calcified MV annulus    Home Medications:  Allergies as of 10/09/2020       Reactions   Macrobid [nitrofurantoin] Nausea And Vomiting        Medication List        Accurate as of October 09, 2020  2:39 PM. If you have any questions, ask your nurse or doctor.          ALPRAZolam 0.5 MG tablet Commonly known as: XANAX Take 0.5 mg by mouth 3 (three) times daily as needed.   amLODipine 10 MG tablet Commonly known as: NORVASC Take 1 tablet (10 mg total) by mouth daily.   cephALEXin 250 MG capsule Commonly known as: Keflex Take 1 capsule (250 mg total) by mouth at bedtime.   FLAXSEED (LINSEED) PO Take 1 capsule by mouth daily.   furosemide 40 MG tablet Commonly known as: LASIX Take  40 mg by mouth as needed.   isosorbide mononitrate 120 MG 24 hr tablet Commonly known as: IMDUR Take 1 tablet (120 mg total) by mouth daily.   metoprolol succinate 25 MG 24 hr tablet Commonly known as: TOPROL-XL TAKE 1 TABLET BY MOUTH TWICE DAILY - TAKE WITH OR IMMEDIATELY FOLLOWING A MEAL   nitroGLYCERIN 0.4 MG SL tablet Commonly known as: Nitrostat Place 1 tablet (0.4 mg total) under the tongue every 5 (five) minutes as needed for chest pain.   rosuvastatin 5 MG tablet Commonly known as: CRESTOR Take 1 tablet (5 mg total) by mouth daily.   sodium bicarbonate 650 MG tablet Take 650 mg by mouth 4 (four) times daily.    sulfamethoxazole-trimethoprim 800-160 MG tablet Commonly known as: BACTRIM DS Take 1 tablet by mouth every 12 (twelve) hours.   trimethoprim 100 MG tablet Commonly known as: TRIMPEX Take 1 tablet (100 mg total) by mouth at bedtime.   Vitamin D (Ergocalciferol) 1.25 MG (50000 UNIT) Caps capsule Commonly known as: DRISDOL TAKE 1 CAPSULE BY MOUTH ONCE A WEEK PT TO TAKE 2000 UNITS OF VITAMIN D OVER THE COUNTER DAILY AFTER THIS RX IS FINISHED        Allergies:  Allergies  Allergen Reactions   Macrobid [Nitrofurantoin] Nausea And Vomiting    Family History: Family History  Problem Relation Age of Onset   Liver cancer Mother    Cancer Father    Heart Problems Brother    Coronary artery disease Sister        stent   Heart Problems Daughter        also stomach problems   COPD Daughter    Colon cancer Neg Hx     Social History:  reports that she has never smoked. She has never used smokeless tobacco. She reports that she does not drink alcohol and does not use drugs.  ROS: All other review of systems were reviewed and are negative except what is noted above in HPI  Physical Exam: Ht 5' 1.5" (1.562 m)   BMI 30.11 kg/m   Constitutional:  Alert and oriented, No acute distress. HEENT: [place] AT, moist mucus membranes.  Trachea midline, no masses. Cardiovascular: No clubbing, cyanosis, or edema. Respiratory: Normal respiratory effort, no increased work of breathing. GI: Abdomen is soft, nontender, nondistended, no abdominal masses GU: No CVA tenderness.  Lymph: No cervical or inguinal lymphadenopathy. Skin: No rashes, bruises or suspicious lesions. Neurologic: Grossly intact, no focal deficits, moving all 4 extremities. Psychiatric: Normal mood and affect.  Laboratory Data: Lab Results  Component Value Date   WBC 10.5 03/14/2019   HGB 10.3 (L) 03/14/2019   HCT 32.1 (L) 03/14/2019   MCV 102.9 (H) 03/14/2019   PLT 131 (L) 03/14/2019    Lab Results  Component Value Date    CREATININE 2.05 (H) 03/14/2019    No results found for: PSA  No results found for: TESTOSTERONE  No results found for: HGBA1C  Urinalysis    Component Value Date/Time   COLORURINE YELLOW 03/15/2019 0246   APPEARANCEUR Clear 10/09/2020 1412   LABSPEC 1.013 03/15/2019 0246   PHURINE 5.0 03/15/2019 0246   GLUCOSEU Negative 10/09/2020 1412   HGBUR NEGATIVE 03/15/2019 0246   BILIRUBINUR Negative 10/09/2020 1412   KETONESUR 20 (A) 03/15/2019 0246   PROTEINUR Trace (A) 10/09/2020 1412   PROTEINUR 30 (A) 03/15/2019 0246   UROBILINOGEN negative (A) 09/09/2019 1533   NITRITE Positive (A) 10/09/2020 1412   NITRITE NEGATIVE 03/15/2019 0246  LEUKOCYTESUR 2+ (A) 10/09/2020 1412   LEUKOCYTESUR TRACE (A) 03/15/2019 0246    Lab Results  Component Value Date   LABMICR See below: 10/09/2020   WBCUA >30 (A) 10/09/2020   LABEPIT >10 (A) 10/09/2020   MUCUS Present 10/09/2020   BACTERIA Many (A) 10/09/2020    Pertinent Imaging:  No results found for this or any previous visit.  No results found for this or any previous visit.  No results found for this or any previous visit.  No results found for this or any previous visit.  Results for orders placed during the hospital encounter of 02/14/18  US RENAL  Narrative CLINICAL DATA:  Chronic stage 3 kidney disease.  EXAM: RENAL / URINARY TRACT ULTRASOUND COMPLETE  COMPARISON:  CT scan 12/19/2016  FINDINGS: Right Kidney:  Length: 8.1 cm. Mild renal cortical thinning but normal echogenicity. No focal lesions or hydronephrosis. No obvious renal calculi.  Left Kidney:  Length: 9.3 cm. Mild renal cortical thinning but normal echogenicity. No focal lesions or hydronephrosis. No definite renal calculi.  Bladder:  Appears normal for degree of bladder distention.  IMPRESSION: Mild renal cortical thinning but normal echogenicity without focal lesion or hydronephrosis.   Electronically Signed By: Marijo Sanes M.D. On:  02/14/2018 16:30  No results found for this or any previous visit.  No results found for this or any previous visit.  No results found for this or any previous visit.   Assessment & Plan:    1. Urinary tract infection without hematuria, site unspecified -Continue trimethoprim 100mg  [facility] - Urinalysis, Routine w reflex microscopic   No follow-ups on file.  Nicolette Bang, MD  [facility] [place]

## 2020-10-09 NOTE — Patient Instructions (Signed)
Urinary Tract Infection, Adult  A urinary tract infection (UTI) is an infection of any part of the urinary tract. The urinary tract includes the kidneys, ureters, bladder, and urethra. These organs make, store, and get rid of urine in the body. An upper UTI affects the ureters and kidneys. A lower UTI affects the bladder and urethra. What are the causes? Most urinary tract infections are caused by bacteria in your genital area around your urethra, where urine leaves your body. These bacteria grow and cause inflammation of your urinary tract. What increases the risk? You are more likely to develop this condition if:  You have a urinary catheter that stays in place.  You are not able to control when you urinate or have a bowel movement (incontinence).  You are female and you: ? Use a spermicide or diaphragm for birth control. ? Have low estrogen levels. ? Are pregnant.  You have certain genes that increase your risk.  You are sexually active.  You take antibiotic medicines.  You have a condition that causes your flow of urine to slow down, such as: ? An enlarged prostate, if you are female. ? Blockage in your urethra. ? A kidney stone. ? A nerve condition that affects your bladder control (neurogenic bladder). ? Not getting enough to drink, or not urinating often.  You have certain medical conditions, such as: ? Diabetes. ? A weak disease-fighting system (immunesystem). ? Sickle cell disease. ? Gout. ? Spinal cord injury. What are the signs or symptoms? Symptoms of this condition include:  Needing to urinate right away (urgency).  Frequent urination. This may include small amounts of urine each time you urinate.  Pain or burning with urination.  Blood in the urine.  Urine that smells bad or unusual.  Trouble urinating.  Cloudy urine.  Vaginal discharge, if you are female.  Pain in the abdomen or the lower back. You may also have:  Vomiting or a decreased  appetite.  Confusion.  Irritability or tiredness.  A fever or chills.  Diarrhea. The first symptom in older adults may be confusion. In some cases, they may not have any symptoms until the infection has worsened. How is this diagnosed? This condition is diagnosed based on your medical history and a physical exam. You may also have other tests, including:  Urine tests.  Blood tests.  Tests for STIs (sexually transmitted infections). If you have had more than one UTI, a cystoscopy or imaging studies may be done to determine the cause of the infections. How is this treated? Treatment for this condition includes:  Antibiotic medicine.  Over-the-counter medicines to treat discomfort.  Drinking enough water to stay hydrated. If you have frequent infections or have other conditions such as a kidney stone, you may need to see a health care provider who specializes in the urinary tract (urologist). In rare cases, urinary tract infections can cause sepsis. Sepsis is a life-threatening condition that occurs when the body responds to an infection. Sepsis is treated in the hospital with IV antibiotics, fluids, and other medicines. Follow these instructions at home: Medicines  Take over-the-counter and prescription medicines only as told by your health care provider.  If you were prescribed an antibiotic medicine, take it as told by your health care provider. Do not stop using the antibiotic even if you start to feel better. General instructions  Make sure you: ? Empty your bladder often and completely. Do not hold urine for long periods of time. ? Empty your bladder after   sex. ? Wipe from front to back after urinating or having a bowel movement if you are female. Use each tissue only one time when you wipe.  Drink enough fluid to keep your urine pale yellow.  Keep all follow-up visits. This is important.   Contact a health care provider if:  Your symptoms do not get better after 1-2  days.  Your symptoms go away and then return. Get help right away if:  You have severe pain in your back or your lower abdomen.  You have a fever or chills.  You have nausea or vomiting. Summary  A urinary tract infection (UTI) is an infection of any part of the urinary tract, which includes the kidneys, ureters, bladder, and urethra.  Most urinary tract infections are caused by bacteria in your genital area.  Treatment for this condition often includes antibiotic medicines.  If you were prescribed an antibiotic medicine, take it as told by your health care provider. Do not stop using the antibiotic even if you start to feel better.  Keep all follow-up visits. This is important. This information is not intended to replace advice given to you by your health care provider. Make sure you discuss any questions you have with your health care provider. Document Revised: 12/06/2019 Document Reviewed: 12/06/2019 Elsevier Patient Education  2021 [employer]  

## 2020-10-16 LAB — URINE CULTURE

## 2020-10-19 ENCOUNTER — Telehealth: Payer: Self-pay

## 2020-10-19 DIAGNOSIS — N390 Urinary tract infection, site not specified: Secondary | ICD-10-CM

## 2020-10-19 MED ORDER — SULFAMETHOXAZOLE-TRIMETHOPRIM 800-160 MG PO TABS: 1.0000 | ORAL_TABLET | Freq: Two times a day (BID) | ORAL | 0 refills | Status: DC

## 2020-10-19 NOTE — Telephone Encounter (Signed)
Rx sent, message left to return call to office.

## 2020-10-19 NOTE — Telephone Encounter (Signed)
-----   Message from Cleon Gustin, MD sent at 10/19/2020 11:45 AM EDT ----- Bactrim DS BID for 7 days ----- Message ----- From: Iris Pert, LPN Sent: 2/55/0016   8:31 AM EDT To: Cleon Gustin, MD  Please review.Marland KitchenMarland Kitchen

## 2020-10-22 NOTE — Telephone Encounter (Signed)
Patient called and made aware.

## 2020-11-23 DIAGNOSIS — E870 Hyperosmolality and hypernatremia: Secondary | ICD-10-CM | POA: Diagnosis not present

## 2020-11-23 DIAGNOSIS — N184 Chronic kidney disease, stage 4 (severe): Secondary | ICD-10-CM | POA: Diagnosis not present

## 2020-11-23 DIAGNOSIS — D631 Anemia in chronic kidney disease: Secondary | ICD-10-CM | POA: Diagnosis not present

## 2020-11-23 DIAGNOSIS — N189 Chronic kidney disease, unspecified: Secondary | ICD-10-CM | POA: Diagnosis not present

## 2020-11-23 DIAGNOSIS — I129 Hypertensive chronic kidney disease with stage 1 through stage 4 chronic kidney disease, or unspecified chronic kidney disease: Secondary | ICD-10-CM | POA: Diagnosis not present

## 2020-11-24 ENCOUNTER — Ambulatory Visit (HOSPITAL_COMMUNITY)
Admission: RE | Admit: 2020-11-24 | Discharge: 2020-11-24 | Disposition: A | Discharge status: Home / Self Care | Payer: Medicare Other | Source: Ambulatory Visit | Attending: Family Medicine | Admitting: Family Medicine

## 2020-11-24 ENCOUNTER — Other Ambulatory Visit (HOSPITAL_COMMUNITY): Payer: Self-pay | Admitting: Family Medicine

## 2020-11-24 ENCOUNTER — Other Ambulatory Visit: Payer: Self-pay

## 2020-11-24 DIAGNOSIS — I10 Essential (primary) hypertension: Secondary | ICD-10-CM | POA: Diagnosis not present

## 2020-11-24 DIAGNOSIS — R059 Cough, unspecified: Secondary | ICD-10-CM | POA: Diagnosis not present

## 2020-11-24 DIAGNOSIS — I2510 Atherosclerotic heart disease of native coronary artery without angina pectoris: Secondary | ICD-10-CM | POA: Diagnosis not present

## 2020-11-24 DIAGNOSIS — R051 Acute cough: Secondary | ICD-10-CM

## 2020-11-26 DIAGNOSIS — N184 Chronic kidney disease, stage 4 (severe): Secondary | ICD-10-CM | POA: Diagnosis not present

## 2020-11-26 DIAGNOSIS — I129 Hypertensive chronic kidney disease with stage 1 through stage 4 chronic kidney disease, or unspecified chronic kidney disease: Secondary | ICD-10-CM | POA: Diagnosis not present

## 2020-11-26 DIAGNOSIS — I5032 Chronic diastolic (congestive) heart failure: Secondary | ICD-10-CM | POA: Diagnosis not present

## 2020-11-26 DIAGNOSIS — R718 Other abnormality of red blood cells: Secondary | ICD-10-CM | POA: Diagnosis not present

## 2020-11-26 DIAGNOSIS — D631 Anemia in chronic kidney disease: Secondary | ICD-10-CM | POA: Diagnosis not present

## 2020-11-26 DIAGNOSIS — R809 Proteinuria, unspecified: Secondary | ICD-10-CM | POA: Diagnosis not present

## 2020-11-26 DIAGNOSIS — N189 Chronic kidney disease, unspecified: Secondary | ICD-10-CM | POA: Diagnosis not present

## 2020-12-06 DIAGNOSIS — I129 Hypertensive chronic kidney disease with stage 1 through stage 4 chronic kidney disease, or unspecified chronic kidney disease: Secondary | ICD-10-CM | POA: Diagnosis not present

## 2020-12-06 DIAGNOSIS — N184 Chronic kidney disease, stage 4 (severe): Secondary | ICD-10-CM | POA: Diagnosis not present

## 2021-01-07 DIAGNOSIS — L570 Actinic keratosis: Secondary | ICD-10-CM | POA: Diagnosis not present

## 2021-01-07 DIAGNOSIS — L820 Inflamed seborrheic keratosis: Secondary | ICD-10-CM | POA: Diagnosis not present

## 2021-01-07 DIAGNOSIS — X32XXXD Exposure to sunlight, subsequent encounter: Secondary | ICD-10-CM | POA: Diagnosis not present

## 2021-01-15 ENCOUNTER — Ambulatory Visit: Payer: Medicare Other | Admitting: Urology

## 2021-01-29 DIAGNOSIS — D519 Vitamin B12 deficiency anemia, unspecified: Secondary | ICD-10-CM | POA: Diagnosis not present

## 2021-01-29 DIAGNOSIS — N184 Chronic kidney disease, stage 4 (severe): Secondary | ICD-10-CM | POA: Diagnosis not present

## 2021-01-29 DIAGNOSIS — R809 Proteinuria, unspecified: Secondary | ICD-10-CM | POA: Diagnosis not present

## 2021-01-29 DIAGNOSIS — Z79899 Other long term (current) drug therapy: Secondary | ICD-10-CM | POA: Diagnosis not present

## 2021-01-29 DIAGNOSIS — N189 Chronic kidney disease, unspecified: Secondary | ICD-10-CM | POA: Diagnosis not present

## 2021-01-29 DIAGNOSIS — D631 Anemia in chronic kidney disease: Secondary | ICD-10-CM | POA: Diagnosis not present

## 2021-01-29 DIAGNOSIS — I129 Hypertensive chronic kidney disease with stage 1 through stage 4 chronic kidney disease, or unspecified chronic kidney disease: Secondary | ICD-10-CM | POA: Diagnosis not present

## 2021-02-03 ENCOUNTER — Emergency Department (HOSPITAL_COMMUNITY)
Admission: EM | Admit: 2021-02-03 | Discharge: 2021-02-03 | Disposition: A | Discharge status: Home / Self Care | Payer: Medicare Other | Attending: Emergency Medicine | Admitting: Emergency Medicine

## 2021-02-03 ENCOUNTER — Other Ambulatory Visit: Payer: Self-pay

## 2021-02-03 ENCOUNTER — Encounter (HOSPITAL_COMMUNITY): Payer: Self-pay | Admitting: Emergency Medicine

## 2021-02-03 DIAGNOSIS — I5032 Chronic diastolic (congestive) heart failure: Secondary | ICD-10-CM | POA: Diagnosis not present

## 2021-02-03 DIAGNOSIS — Z951 Presence of aortocoronary bypass graft: Secondary | ICD-10-CM | POA: Diagnosis not present

## 2021-02-03 DIAGNOSIS — I129 Hypertensive chronic kidney disease with stage 1 through stage 4 chronic kidney disease, or unspecified chronic kidney disease: Secondary | ICD-10-CM | POA: Insufficient documentation

## 2021-02-03 DIAGNOSIS — R1909 Other intra-abdominal and pelvic swelling, mass and lump: Secondary | ICD-10-CM | POA: Diagnosis present

## 2021-02-03 DIAGNOSIS — Z79899 Other long term (current) drug therapy: Secondary | ICD-10-CM | POA: Diagnosis not present

## 2021-02-03 DIAGNOSIS — I2510 Atherosclerotic heart disease of native coronary artery without angina pectoris: Secondary | ICD-10-CM | POA: Diagnosis not present

## 2021-02-03 DIAGNOSIS — E872 Acidosis: Secondary | ICD-10-CM | POA: Diagnosis not present

## 2021-02-03 DIAGNOSIS — L0291 Cutaneous abscess, unspecified: Secondary | ICD-10-CM

## 2021-02-03 DIAGNOSIS — L03314 Cellulitis of groin: Secondary | ICD-10-CM | POA: Diagnosis not present

## 2021-02-03 DIAGNOSIS — E538 Deficiency of other specified B group vitamins: Secondary | ICD-10-CM | POA: Diagnosis not present

## 2021-02-03 DIAGNOSIS — N189 Chronic kidney disease, unspecified: Secondary | ICD-10-CM | POA: Diagnosis not present

## 2021-02-03 DIAGNOSIS — N184 Chronic kidney disease, stage 4 (severe): Secondary | ICD-10-CM | POA: Diagnosis not present

## 2021-02-03 DIAGNOSIS — D631 Anemia in chronic kidney disease: Secondary | ICD-10-CM | POA: Diagnosis not present

## 2021-02-03 DIAGNOSIS — N764 Abscess of vulva: Secondary | ICD-10-CM | POA: Diagnosis not present

## 2021-02-03 DIAGNOSIS — L02214 Cutaneous abscess of groin: Secondary | ICD-10-CM | POA: Diagnosis not present

## 2021-02-03 DIAGNOSIS — L02224 Furuncle of groin: Secondary | ICD-10-CM | POA: Diagnosis not present

## 2021-02-03 DIAGNOSIS — R809 Proteinuria, unspecified: Secondary | ICD-10-CM | POA: Diagnosis not present

## 2021-02-03 LAB — CBC WITH DIFFERENTIAL/PLATELET
Abs Immature Granulocytes: 0.01 10*3/uL (ref 0.00–0.07)
Basophils Absolute: 0 10*3/uL (ref 0.0–0.1)
Basophils Relative: 0 %
Eosinophils Absolute: 0 10*3/uL (ref 0.0–0.5)
Eosinophils Relative: 1 %
HCT: 31.8 % — ABNORMAL LOW (ref 36.0–46.0)
Hemoglobin: 10.2 g/dL — ABNORMAL LOW (ref 12.0–15.0)
Immature Granulocytes: 0 %
Lymphocytes Relative: 45 %
Lymphs Abs: 1.9 10*3/uL (ref 0.7–4.0)
MCH: 33.2 pg (ref 26.0–34.0)
MCHC: 32.1 g/dL (ref 30.0–36.0)
MCV: 103.6 fL — ABNORMAL HIGH (ref 80.0–100.0)
Monocytes Absolute: 0.4 10*3/uL (ref 0.1–1.0)
Monocytes Relative: 10 %
Neutro Abs: 1.9 10*3/uL (ref 1.7–7.7)
Neutrophils Relative %: 44 %
Platelets: 124 10*3/uL — ABNORMAL LOW (ref 150–400)
RBC: 3.07 MIL/uL — ABNORMAL LOW (ref 3.87–5.11)
RDW: 13.3 % (ref 11.5–15.5)
WBC: 4.2 10*3/uL (ref 4.0–10.5)
nRBC: 0 % (ref 0.0–0.2)

## 2021-02-03 LAB — BASIC METABOLIC PANEL
Anion gap: 6 (ref 5–15)
BUN: 35 mg/dL — ABNORMAL HIGH (ref 8–23)
CO2: 22 mmol/L (ref 22–32)
Calcium: 8.8 mg/dL — ABNORMAL LOW (ref 8.9–10.3)
Chloride: 109 mmol/L (ref 98–111)
Creatinine, Ser: 1.94 mg/dL — ABNORMAL HIGH (ref 0.44–1.00)
GFR, Estimated: 25 mL/min — ABNORMAL LOW (ref >60)
Glucose, Bld: 122 mg/dL — ABNORMAL HIGH (ref 70–99)
Potassium: 4 mmol/L (ref 3.5–5.1)
Sodium: 137 mmol/L (ref 135–145)

## 2021-02-03 MED ORDER — DOXYCYCLINE HYCLATE 100 MG PO CAPS: 100.0000 mg | ORAL_CAPSULE | Freq: Two times a day (BID) | ORAL | 0 refills | Status: AC

## 2021-02-03 MED ORDER — LIDOCAINE-EPINEPHRINE (PF) 1 %-1:200000 IJ SOLN
10.0000 mL | Freq: Once | INTRAMUSCULAR | Status: AC
  Administered 2021-02-03: 10 mL via INTRADERMAL
  Filled 2021-02-03: qty 30

## 2021-02-03 MED ORDER — DOXYCYCLINE HYCLATE 100 MG PO TABS
100.0000 mg | ORAL_TABLET | Freq: Once | ORAL | Status: AC
  Administered 2021-02-03: 100 mg via ORAL
  Filled 2021-02-03: qty 1

## 2021-02-03 NOTE — ED Provider Notes (Signed)
[facility] EMERGENCY DEPARTMENT Provider Note   CSN: [id number] Arrival date & time: 02/03/21  1155     History Chief Complaint  Patient presents with   Abscess    [name] is a 87 y.o. female who presents with concern for redness, pain, and swelling to her right groin x4 days.  History of "boils when I was little" but states it has been years since she had any skin infection.  Was at a specialist office this morning and was directed to the emergency department for possible incision and drainage.  She denies any fevers, nausea, vomiting but endorses may be transient episode of chills last night.  Denies any recent antibiotic use.  I personally reviewed this patient's medical records.  She is history of coronary artery disease, GERD, left bundle branch block, and urinary incontinence with chronic cystitis.  HPI     Past Medical History:  Diagnosis Date   Anemia    Anxiety    Arthritis    Bleeding from the nose Oct 2014   CAD (coronary artery disease)    a. s/p rotational atherectomy and DES placement to LAD in 2012 b. low-risk NST in 08/2018   Cataract of both eyes    Chronic kidney disease    Colon polyps    Hyperlipidemia    Hypertension    x 5-6 yrs.   LBBB (left bundle branch block)    Renal insufficiency     Patient Active Problem List   Diagnosis Date Noted   Chronic cystitis with hematuria 05/08/2019   Urgency incontinence 12/27/2017   Pelvic and perineal pain 12/10/2016   Pelvic mass in female 11/28/2016   Hypertensive heart disease 01/17/2015   History of renal insufficiency syndrome 05/12/2014   stage 2 hypertension 05/03/2013   CAD (coronary artery disease) 05/03/2013   GERD (gastroesophageal reflux disease) 05/03/2013   LBBB (left bundle branch block) 05/03/2013   Hemorrhoids 03/12/2013   Epistaxis 03/12/2013   Anemia, iron deficiency 03/02/2013   Anemia macrocytic 12/20/2012   Personal history of colonic polyps 12/20/2012    Past Surgical  History:  Procedure Laterality Date   BREAST SURGERY     masses removed while she was pregnant   CARDIAC CATHETERIZATION  06/20/2008   severe single vessel high-grade stenosis of mid LAD at bifurcation of diagonal 1 and setpal perforator 1; diffuse coronary calcification of L coronary system; 50% stenosis of mid RCA (Dr. Jackie Plum)   CATARACT EXTRACTION W/PHACO Right 10/10/2013   Procedure: CATARACT EXTRACTION PHACO AND INTRAOCULAR LENS PLACEMENT ([place]);  Surgeon: Tonny Branch, MD;  Location: [facility];  Service: Ophthalmology;  Laterality: Right;  CDE:11.96   CATARACT EXTRACTION W/PHACO Left 11/18/2013   Procedure: CATARACT EXTRACTION PHACO AND INTRAOCULAR LENS PLACEMENT ([facility]);  Surgeon: Tonny Branch, MD;  Location: [facility];  Service: Ophthalmology;  Laterality: Left;  CDE 15.96   COLONOSCOPY N/A 01/16/2013   Procedure: COLONOSCOPY;  Surgeon: Rogene Houston, MD;  Location: [facility];  Service: Endoscopy;  Laterality: N/A;  1200   CORONARY ANGIOPLASTY WITH STENT PLACEMENT  11/08/2010   complex high-speed rotational atherectomy of calcified LAD, diffusely disease LAD system (Dr. Corky Downs)   EXCISION ORAL TUMOR Right 01/14/2014   Procedure: EXCISION OROPHARYNGEAL MASS;  Surgeon: Ascencion Dike, MD;  Location: [place];  Service: ENT;  Laterality: Right;   NM MYOCAR [place]  05/2011   lexiscan myoview; normal pattern of perfusion in all regions; post-stress EF 55%; low risk  scan    PARTIAL HYSTERECTOMY     TONSILLECTOMY     TRANSTHORACIC ECHOCARDIOGRAM  10/2010   EF 45-50%, mod conc LVH, grade 1 diastolic dysfunction, mildly calcified left AV cusp; calcified MV annulus     OB History     Gravida  4   Para  4   Term      Preterm      AB      Living  3      SAB      IAB      Ectopic      Multiple      Live Births              Family History  Problem Relation Age of Onset   Liver cancer Mother    Cancer Father    Heart Problems Brother    Coronary artery  disease Sister        stent   Heart Problems Daughter        also stomach problems   COPD Daughter    Colon cancer Neg Hx     Social History   Tobacco Use   Smoking status: Never   Smokeless tobacco: Never  Vaping Use   Vaping Use: Never used  Substance Use Topics   Alcohol use: No   Drug use: No    Home Medications Prior to Admission medications   Medication Sig Start Date End Date Taking? Authorizing Provider  ALPRAZolam Duanne Moron) 0.5 MG tablet Take 0.5 mg by mouth 3 (three) times daily as needed. 06/05/20   [provider]  amLODipine (NORVASC) 10 MG tablet Take 1 tablet (10 mg total) by mouth daily. 01/21/20 04/20/20  Arnoldo Lenis, MD  cephALEXin (KEFLEX) 250 MG capsule Take 1 capsule (250 mg total) by mouth at bedtime. 12/11/19   McKenzie, Candee Furbish, MD  conjugated estrogens (PREMARIN) vaginal cream Place 1 Applicatorful vaginally daily. 10/09/20   McKenzie, Candee Furbish, MD  FLAXSEED, LINSEED, PO Take 1 capsule by mouth daily.     [provider]  furosemide (LASIX) 40 MG tablet Take 40 mg by mouth as needed.  12/25/17   [provider]  isosorbide mononitrate (IMDUR) 120 MG 24 hr tablet Take 1 tablet (120 mg total) by mouth daily. 08/28/20   Arnoldo Lenis, MD  metoprolol succinate (TOPROL-XL) 25 MG 24 hr tablet TAKE 1 TABLET BY MOUTH TWICE DAILY - TAKE WITH OR IMMEDIATELY FOLLOWING A MEAL 08/17/20   Strader, [place] M, PA-C  nitroGLYCERIN (NITROSTAT) 0.4 MG SL tablet Place 1 tablet (0.4 mg total) under the tongue every 5 (five) minutes as needed for chest pain. 07/16/19   Strader, Fransisco Hertz, PA-C  rosuvastatin (CRESTOR) 5 MG tablet Take 1 tablet (5 mg total) by mouth daily. 02/25/20   Arnoldo Lenis, MD  sodium bicarbonate 650 MG tablet Take 650 mg by mouth 4 (four) times daily.    [provider]  sulfamethoxazole-trimethoprim (BACTRIM DS) 800-160 MG tablet Take 1 tablet by mouth every 12 (twelve) hours. 07/15/20   McKenzie, Candee Furbish, MD   sulfamethoxazole-trimethoprim (BACTRIM DS) 800-160 MG tablet Take 1 tablet by mouth 2 (two) times daily. 10/19/20   McKenzie, Candee Furbish, MD  trimethoprim (TRIMPEX) 100 MG tablet Take 1 tablet (100 mg total) by mouth at bedtime. 07/08/20   McKenzie, Candee Furbish, MD  Vitamin D, Ergocalciferol, (DRISDOL) 50000 units CAPS capsule TAKE 1 CAPSULE BY MOUTH ONCE A WEEK PT TO TAKE 2000 UNITS OF VITAMIN  D OVER THE COUNTER DAILY AFTER THIS RX IS FINISHED 10/19/17   [provider]    Allergies    Macrobid [nitrofurantoin]  Review of Systems   Review of Systems  Constitutional:  Positive for chills. Negative for activity change, appetite change, fatigue and fever.  HENT: Negative.    Respiratory: Negative.    Cardiovascular: Negative.   Gastrointestinal: Negative.   Genitourinary:  Negative for decreased urine volume, dysuria, frequency, hematuria, menstrual problem, urgency, vaginal bleeding, vaginal discharge and vaginal pain.       Swelling to the right groin, with associated TTP, redness, and drainage.  Musculoskeletal: Negative.   Neurological: Negative.    Physical Exam Updated Vital Signs BP (!) 187/74 (BP Location: Right Arm)   Pulse 72   Temp 98.2 F (36.8 C)   Resp 18   Ht 5\' 1"  (1.549 m)   Wt 72.1 kg   SpO2 96%   BMI 30.04 kg/m   Physical Exam Vitals and nursing note reviewed. Exam conducted with a chaperone present.  Constitutional:      General: She is not in acute distress.    Appearance: She is not ill-appearing or toxic-appearing.  HENT:     Head: Normocephalic and atraumatic.     Nose: Nose normal. No congestion.     Mouth/Throat:     Mouth: Mucous membranes are moist.     Pharynx: No oropharyngeal exudate or posterior oropharyngeal erythema.  Eyes:     General:        Right eye: No discharge.        Left eye: No discharge.     Extraocular Movements: Extraocular movements intact.     Conjunctiva/sclera: Conjunctivae normal.     Pupils: Pupils are equal,  round, and reactive to light.  Cardiovascular:     Rate and Rhythm: Normal rate and regular rhythm.     Pulses: Normal pulses.     Heart sounds: Normal heart sounds. No murmur heard. Pulmonary:     Effort: Pulmonary effort is normal. No respiratory distress.     Breath sounds: Normal breath sounds. No wheezing or rales.  Abdominal:     General: Bowel sounds are normal. There is no distension.     Palpations: Abdomen is soft.     Tenderness: There is no abdominal tenderness. There is no right CVA tenderness, left CVA tenderness, guarding or rebound.  Genitourinary:    Exam position: Prone.     Labia:        Right: Tenderness and lesion present.        Comments:  Patient does have some intertriginous rash in the bilateral inguinal folds Musculoskeletal:        General: No deformity.     Cervical back: Neck supple.     Right lower leg: No edema.     Left lower leg: No edema.  Lymphadenopathy:     Lower Body: No right inguinal adenopathy. No left inguinal adenopathy.  Skin:    General: Skin is warm and dry.     Capillary Refill: Capillary refill takes less than 2 seconds.     Findings: Abscess and wound present.  Neurological:     General: No focal deficit present.     Mental Status: She is alert and oriented to person, place, and time. Mental status is at baseline.  Psychiatric:        Mood and Affect: Mood normal.    ED Results / Procedures / Treatments   Labs (all  labs ordered are listed, but only abnormal results are displayed) Labs Reviewed  CBC WITH DIFFERENTIAL/PLATELET  BASIC METABOLIC PANEL    EKG None  Radiology No results found.  Procedures .Marland KitchenIncision and Drainage  Date/Time: 02/05/2021 1:33 PM Performed by: Emeline Darling, PA-C Authorized by: Emeline Darling, PA-C   Consent:    Consent obtained:  Verbal   Consent given by:  Patient   Risks discussed:  Bleeding, incomplete drainage, pain and damage to other organs   Alternatives  discussed:  No treatment Universal protocol:    Procedure explained and questions answered to patient or proxy's satisfaction: yes     Relevant documents present and verified: yes     Test results available : yes     Imaging studies available: yes     Required blood products, implants, devices, and special equipment available: yes     Site/side marked: yes     Immediately prior to procedure, a time out was called: yes     Patient identity confirmed:  Verbally with patient Location:    Type:  Abscess   Size:  5 cm   Location:  Anogenital   Anogenital location:  Vulva Pre-procedure details:    Skin preparation:  Betadine (Right labial majora) Anesthesia:    Anesthesia method:  Local infiltration   Local anesthetic:  Lidocaine 1% WITH epi Procedure type:    Complexity:  Simple Procedure details:    Incision types:  Single straight   Incision depth:  Subcutaneous   Wound management:  Probed and deloculated, irrigated with saline and extensive cleaning   Drainage:  Purulent   Drainage amount:  Scant   Wound treatment:  Wound left open   Packing materials:  None Post-procedure details:    Procedure completion:  Tolerated well, no immediate complications Ultrasound ED Soft Tissue  Date/Time: 02/05/2021 1:34 PM Performed by: Emeline Darling, PA-C Authorized by: Emeline Darling, PA-C   Procedure details:    Indications: localization of abscess and evaluate for cellulitis     Transverse view:  Visualized   Longitudinal view:  Visualized   Images: not archived   Location:    Location: groin     Location comment:  Right labial majora and inguinal fold   Side:  Right Findings:     abscess present    cellulitis present Comments:     Cobblestoning of the tissues of the right labia majora consistent with exam concerning for cellulitis; additionally small pocket of fluid located centrally, suggesting small abscess amenable to drainage.   Medications Ordered in  ED Medications  lidocaine-EPINEPHrine (XYLOCAINE-EPINEPHrine) 1 %-1:200000 (PF) injection 10 mL (has no administration in time range)    ED Course  I have reviewed the triage vital signs and the nursing notes.  Pertinent labs & imaging results that were available during my care of the patient were reviewed by me and considered in my medical decision making (see chart for details).  Clinical Course as of 02/05/21 1332  Wed Feb 04, 8316  [id number] 87 year old female here with a possible abscess down under perineal area.  It sounds like she has had these before.  Otherwise no systemic illness.  Provider is going to do a bedside ultrasound to see if there is an area to I&D.  Likely needs some antibiotics [MB]    Clinical Course User Index [MB] Hayden Rasmussen, MD   MDM Rules/Calculators/A&P  87 year old female who presents with concern for swelling and pain in the groin for 4 days.   Differential diagnosis includes but is limited to cellulitis, abscess, erysipelas, hidradenitis suppurativa, Bartholin gland cyst, lymphadenopathy.  Hypertensive on intake, vital signs otherwise normal.  Cardiopulmonary exam is normal, abdominal exam is benign.  GU exam revealed edema, erythema, and tenderness to palpation of the right labia majora without surrounding erythema or induration or area of fluctuance.  CBC without leukocytosis and with hemoglobin of 10.2 near patient's baseline.  Additionally BMP with creatinine of 1.9 near patient's baseline of 2.  No CT warranted given localization of infection on exam most consistent with cellulitis.  Bedside ultrasound was utilized to evaluate for associated abscess which did reveal small collection of fluid.  This was incised and drained as above after extensive discussion with the patient regarding her treatment options: Observation  + antibiotic therapy versus I&D followed by antibiotics.  Patient voiced understanding of her treatment options  and expresses to proceed with incision and drainage today.  She tolerated the procedure well and the wound was left open.  The patient was placed on antibiotics in the outpatient setting with particular consideration of her chronic kidney disease.  No further work-up warranted in ED at this time given reassuring physical exam and laboratory findings.  Maleiya voiced understanding of her medical evaluation and treatment plan.  Each of her questions was answered to her expressed satisfaction.  Return precautions were given.  Patient is well-appearing, stable, and appropriate for discharge at this time.  This chart was dictated using voice recognition software, Dragon. Despite the best efforts of this provider to proofread and correct errors, errors may still occur which can change documentation meaning.   Final Clinical Impression(s) / ED Diagnoses Final diagnoses:  None    Rx / DC Orders ED Discharge Orders     None        Aura Dials 02/05/21 1341    Hayden Rasmussen, MD 02/06/21 [id number]

## 2021-02-03 NOTE — Discharge Instructions (Signed)
You were seen in the ER today for your swelling in your groin area.  Diagnosed with a labial abscess which is a collection of infection of the skin in her labia.  This was drained in the emergency department you have been prescribed antibiotics to take at home for the next week.  Please take these as prescribed for the entire course and follow-up with your primary care doctor.  You may do sitz bath's, which is a few inches of warm water in the bottom of the tub, and hot compresses.  You may use Tylenol as needed for your symptoms.  Return to the ER with any fevers, chills, nausea or vomiting does not stop, or any other new severe symptoms.

## 2021-02-03 NOTE — ED Triage Notes (Signed)
Pt to the ED with an abscess on her groin, send to ED by PCP.

## 2021-02-12 ENCOUNTER — Other Ambulatory Visit: Payer: Self-pay | Admitting: Student

## 2021-02-12 NOTE — Telephone Encounter (Signed)
This is a [place] pt.  °

## 2021-02-15 ENCOUNTER — Other Ambulatory Visit (HOSPITAL_COMMUNITY): Payer: Self-pay | Admitting: Family Medicine

## 2021-02-15 DIAGNOSIS — Z1231 Encounter for screening mammogram for malignant neoplasm of breast: Secondary | ICD-10-CM

## 2021-02-18 DIAGNOSIS — I10 Essential (primary) hypertension: Secondary | ICD-10-CM | POA: Diagnosis not present

## 2021-02-18 DIAGNOSIS — R21 Rash and other nonspecific skin eruption: Secondary | ICD-10-CM | POA: Diagnosis not present

## 2021-02-18 DIAGNOSIS — D510 Vitamin B12 deficiency anemia due to intrinsic factor deficiency: Secondary | ICD-10-CM | POA: Diagnosis not present

## 2021-02-18 DIAGNOSIS — L02214 Cutaneous abscess of groin: Secondary | ICD-10-CM | POA: Diagnosis not present

## 2021-02-18 DIAGNOSIS — I129 Hypertensive chronic kidney disease with stage 1 through stage 4 chronic kidney disease, or unspecified chronic kidney disease: Secondary | ICD-10-CM | POA: Diagnosis not present

## 2021-02-25 ENCOUNTER — Ambulatory Visit (HOSPITAL_COMMUNITY)
Admission: RE | Admit: 2021-02-25 | Discharge: 2021-02-25 | Disposition: A | Discharge status: Home / Self Care | Payer: Medicare Other | Source: Ambulatory Visit | Attending: Family Medicine | Admitting: Family Medicine

## 2021-02-25 ENCOUNTER — Other Ambulatory Visit: Payer: Self-pay

## 2021-02-25 DIAGNOSIS — Z1231 Encounter for screening mammogram for malignant neoplasm of breast: Secondary | ICD-10-CM | POA: Diagnosis not present

## 2021-04-12 DIAGNOSIS — I129 Hypertensive chronic kidney disease with stage 1 through stage 4 chronic kidney disease, or unspecified chronic kidney disease: Secondary | ICD-10-CM | POA: Diagnosis not present

## 2021-04-12 DIAGNOSIS — D631 Anemia in chronic kidney disease: Secondary | ICD-10-CM | POA: Diagnosis not present

## 2021-04-12 DIAGNOSIS — N189 Chronic kidney disease, unspecified: Secondary | ICD-10-CM | POA: Diagnosis not present

## 2021-04-12 DIAGNOSIS — R809 Proteinuria, unspecified: Secondary | ICD-10-CM | POA: Diagnosis not present

## 2021-04-12 DIAGNOSIS — N184 Chronic kidney disease, stage 4 (severe): Secondary | ICD-10-CM | POA: Diagnosis not present

## 2021-04-26 DIAGNOSIS — N184 Chronic kidney disease, stage 4 (severe): Secondary | ICD-10-CM | POA: Diagnosis not present

## 2021-04-26 DIAGNOSIS — I129 Hypertensive chronic kidney disease with stage 1 through stage 4 chronic kidney disease, or unspecified chronic kidney disease: Secondary | ICD-10-CM | POA: Diagnosis not present

## 2021-04-26 DIAGNOSIS — R809 Proteinuria, unspecified: Secondary | ICD-10-CM | POA: Diagnosis not present

## 2021-04-26 DIAGNOSIS — I5032 Chronic diastolic (congestive) heart failure: Secondary | ICD-10-CM | POA: Diagnosis not present

## 2021-04-26 DIAGNOSIS — E538 Deficiency of other specified B group vitamins: Secondary | ICD-10-CM | POA: Diagnosis not present

## 2021-04-28 DIAGNOSIS — D510 Vitamin B12 deficiency anemia due to intrinsic factor deficiency: Secondary | ICD-10-CM | POA: Diagnosis not present

## 2021-05-05 ENCOUNTER — Telehealth: Payer: Self-pay

## 2021-05-05 NOTE — Telephone Encounter (Signed)
Our med list has her taking toprol 25mg  bid, can we clarify if she is only taking daily if so could increase to bid  Zandra Abts MD

## 2021-05-05 NOTE — Telephone Encounter (Signed)
[name] walked into office today with c/o increased palpitations over the last week. She states she is under a lot of stress, her husband has liver cancer and is not doing well.She only drinks one cup of decaffeinated coffee a day. She is currently taking Toprol Xl 25 mg daily.     I will message Dr.Branch

## 2021-05-05 NOTE — Telephone Encounter (Signed)
Attempt to reach, no answer,voicemail not set up.

## 2021-05-06 NOTE — Telephone Encounter (Signed)
Attempt to reach again, no answer,voicemail not set up.

## 2021-05-07 ENCOUNTER — Other Ambulatory Visit: Payer: Self-pay | Admitting: Student

## 2021-05-07 NOTE — Telephone Encounter (Signed)
I reached patient. She has new phone and her friends are going to help her set up her voicemail.She was not taking Toprol BID, only once a day.She will take it BID from now on.She will update [place] later.

## 2021-05-11 DIAGNOSIS — R809 Proteinuria, unspecified: Secondary | ICD-10-CM | POA: Diagnosis not present

## 2021-05-11 DIAGNOSIS — N184 Chronic kidney disease, stage 4 (severe): Secondary | ICD-10-CM | POA: Diagnosis not present

## 2021-05-11 DIAGNOSIS — E538 Deficiency of other specified B group vitamins: Secondary | ICD-10-CM | POA: Diagnosis not present

## 2021-05-11 DIAGNOSIS — I129 Hypertensive chronic kidney disease with stage 1 through stage 4 chronic kidney disease, or unspecified chronic kidney disease: Secondary | ICD-10-CM | POA: Diagnosis not present

## 2021-05-11 DIAGNOSIS — I5032 Chronic diastolic (congestive) heart failure: Secondary | ICD-10-CM | POA: Diagnosis not present

## 2021-06-01 DIAGNOSIS — D510 Vitamin B12 deficiency anemia due to intrinsic factor deficiency: Secondary | ICD-10-CM | POA: Diagnosis not present

## 2021-06-07 ENCOUNTER — Other Ambulatory Visit: Payer: Self-pay | Admitting: Cardiology

## 2021-06-17 NOTE — Progress Notes (Signed)
Cardiology Office Note    Date:  06/19/2021   ID:  [name], DOB Aug 16, 1933, MRN [id number]  PCP:  Sharilyn Sites, MD  Cardiologist: Carlyle Dolly, MD    Chief Complaint  Patient presents with   Follow-up    6 month visit    History of Present Illness:    [name] is a 88 y.o. female with past medical history of CAD (s/p rotational atherectomy and DES placement to LAD in 2012, low-risk NST in 08/2018), HTN, HLD, and known LBBB who presents to the office today for overdue 29-month follow-up.   She was last examined by Dr. Harl Bowie in 07/2020 and denied any recent anginal symptoms. Was having occasional palpitations but would resolve within a a few seconds. She was continued on her current cardiac medications.   She did notify the office in 04/2021 of worsening palpitations and was only taking Toprol-XL 25mg  once daily, therefore was informed to increase this to BID dosing.   In talking with the patient today, she reports being under increased stress as her husband has end-stage liver cancer and is currently being followed by Hospice. She is the primary caregiver for him. She does report more frequent palpitations over the past few months which she describes as a skip. No tachy-palpitations. No chest pain, progressive dyspnea on exertion, orthopnea, PND or pitting edema.   Past Medical History:  Diagnosis Date   Anemia    Anxiety    Arthritis    Bleeding from the nose Oct 2014   CAD (coronary artery disease)    a. s/p rotational atherectomy and DES placement to LAD in 2012 b. low-risk NST in 08/2018   Cataract of both eyes    Chronic kidney disease    Colon polyps    Hyperlipidemia    Hypertension    x 5-6 yrs.   LBBB (left bundle branch block)    Renal insufficiency     Past Surgical History:  Procedure Laterality Date   BREAST SURGERY     masses removed while she was pregnant   CARDIAC CATHETERIZATION  06/20/2008   severe single vessel high-grade stenosis of  mid LAD at bifurcation of diagonal 1 and setpal perforator 1; diffuse coronary calcification of L coronary system; 50% stenosis of mid RCA (Dr. Jackie Plum)   CATARACT EXTRACTION W/PHACO Right 10/10/2013   Procedure: CATARACT EXTRACTION PHACO AND INTRAOCULAR LENS PLACEMENT ([place]);  Surgeon: Tonny Branch, MD;  Location: [facility];  Service: Ophthalmology;  Laterality: Right;  CDE:11.96   CATARACT EXTRACTION W/PHACO Left 11/18/2013   Procedure: CATARACT EXTRACTION PHACO AND INTRAOCULAR LENS PLACEMENT ([facility]);  Surgeon: Tonny Branch, MD;  Location: [facility];  Service: Ophthalmology;  Laterality: Left;  CDE 15.96   COLONOSCOPY N/A 01/16/2013   Procedure: COLONOSCOPY;  Surgeon: Rogene Houston, MD;  [facility];  Service: Endoscopy;  Laterality: N/A;  1200   CORONARY ANGIOPLASTY WITH STENT PLACEMENT  11/08/2010   complex high-speed rotational atherectomy of calcified LAD, diffusely disease LAD system (Dr. Corky Downs)   EXCISION ORAL TUMOR Right 01/14/2014   Procedure: EXCISION OROPHARYNGEAL MASS;  Surgeon: Ascencion Dike, MD;  Location: [facility];  Service: ENT;  Laterality: Right;   NM MYOCAR [place]  05/2011   lexiscan myoview; normal pattern of perfusion in all regions; post-stress EF 55%; low risk scan    PARTIAL HYSTERECTOMY     TONSILLECTOMY     TRANSTHORACIC ECHOCARDIOGRAM  10/2010   EF 45-50%,  mod conc LVH, grade 1 diastolic dysfunction, mildly calcified left AV cusp; calcified MV annulus    Current Medications: Outpatient Medications Prior to Visit  Medication Sig Dispense Refill   ALPRAZolam (XANAX) 0.5 MG tablet Take 0.5 mg by mouth 3 (three) times daily as needed.     amLODipine (NORVASC) 10 MG tablet Take 1 tablet by mouth once daily 90 tablet 0   aspirin EC 81 MG tablet Take 81 mg by mouth daily. Swallow whole.     conjugated estrogens (PREMARIN) vaginal cream Place 1 Applicatorful vaginally daily. 42.5 g 12   FLAXSEED, LINSEED, PO Take 1 capsule by mouth daily.       furosemide (LASIX) 40 MG tablet Take 40 mg by mouth as needed.      isosorbide mononitrate (IMDUR) 120 MG 24 hr tablet Take 1 tablet (120 mg total) by mouth daily. 90 tablet 3   nitroGLYCERIN (NITROSTAT) 0.4 MG SL tablet Place 1 tablet (0.4 mg total) under the tongue every 5 (five) minutes as needed for chest pain. 25 tablet 3   Vitamin D, Ergocalciferol, (DRISDOL) 50000 units CAPS capsule TAKE 1 CAPSULE BY MOUTH ONCE A WEEK PT TO TAKE 2000 UNITS OF VITAMIN D OVER THE COUNTER DAILY AFTER THIS RX IS FINISHED  0   metoprolol succinate (TOPROL-XL) 25 MG 24 hr tablet TAKE 1 TABLET BY MOUTH TWICE DAILY WITH OR IMMEDIATELY FOLLOWING A MEAL 180 tablet 3   rosuvastatin (CRESTOR) 5 MG tablet Take 1 tablet (5 mg total) by mouth daily. 90 tablet 1   sodium bicarbonate 650 MG tablet Take 650 mg by mouth 4 (four) times daily.     valsartan (DIOVAN) 40 MG tablet Take 40 mg by mouth daily.     Facility-Administered Medications Prior to Visit  Medication Dose Route Frequency Provider Last Rate Last Admin   polyethylene glycol powder (GLYCOLAX/MIRALAX) container 255 g  1 Container Oral Once Jonnie Kind, MD         Allergies:   Macrobid [nitrofurantoin]   Social History   Socioeconomic History   Marital status: Married    Spouse name: Not on file   Number of children: 4   Years of education: 8   Highest education level: Not on file  Occupational History   Not on file  Tobacco Use   Smoking status: Never   Smokeless tobacco: Never  Vaping Use   Vaping Use: Never used  Substance and Sexual Activity   Alcohol use: No   Drug use: No   Sexual activity: Yes    Birth control/protection: Surgical  Other Topics Concern   Not on file  Social History Narrative   Not on file   Social Determinants of Health   Financial Resource Strain: Not on file  Food Insecurity: Not on file  Transportation Needs: Not on file  Physical Activity: Not on file  Stress: Not on file  Social Connections: Not on  file     Family History:  The patient's family history includes COPD in her daughter; Cancer in her father; Coronary artery disease in her sister; Heart Problems in her brother and daughter; Liver cancer in her mother.   Review of Systems:    Please see the history of present illness.     All other systems reviewed and are otherwise negative except as noted above.   Physical Exam:    VS:  BP (!) 142/70    Pulse 76    Ht 5\' 2"  (1.575 m)  Wt 156 lb (70.8 kg)    SpO2 96%    BMI 28.53 kg/m    General: Pleasant elderly female appearing in no acute distress. Head: Normocephalic, atraumatic. Neck: No carotid bruits. JVD not elevated.  Lungs: Respirations regular and unlabored, without wheezes or rales.  Heart: Regular rate and rhythm. No S3 or S4.  No murmur, no rubs, or gallops appreciated. Abdomen: Appears non-distended. No obvious abdominal masses. Msk:  Strength and tone appear normal for age. No obvious joint deformities or effusions. Extremities: No clubbing or cyanosis. No pitting edema.  Distal pedal pulses are 2+ bilaterally. Neuro: Alert and oriented X 3. Moves all extremities spontaneously. No focal deficits noted. Psych:  Responds to questions appropriately with a normal affect. Skin: No rashes or lesions noted  Wt Readings from Last 3 Encounters:  06/18/21 156 lb (70.8 kg)  02/03/21 159 lb (72.1 kg)  07/29/20 162 lb (73.5 kg)     Studies/Labs Reviewed:   EKG:  EKG is ordered today.  The ekg ordered today demonstrates NSR, HR 69 with 1st degree AV block and known LBBB. Occasional PVC's.   Recent Labs: 02/03/2021: BUN 35; Creatinine, Ser 1.94; Hemoglobin 10.2; Platelets 124; Potassium 4.0; Sodium 137   Lipid Panel    Component Value Date/Time   CHOL 164 05/13/2014 0846   TRIG 118 05/13/2014 0846   HDL 46 05/13/2014 0846   CHOLHDL 3.6 05/13/2014 0846   VLDL 24 05/13/2014 0846   LDLCALC 94 05/13/2014 0846    Additional studies/ records that were reviewed today  include:   Echocardiogram: 01/2015 Study Conclusions   - Left ventricle: The cavity size was normal. Wall thickness was    increased in a pattern of mild LVH. Systolic function was normal.    The estimated ejection fraction was in the range of 55% to 60%.    Wall motion was normal; there were no regional wall motion    abnormalities. Doppler parameters are consistent with abnormal    left ventricular relaxation (grade 1 diastolic dysfunction).  - Ventricular septum: Septal motion showed abnormal function and    dyssynergy.  - Aortic valve: Mildly calcified annulus. Trileaflet.  - Mitral valve: Mildly thickened leaflets . There was trivial    regurgitation.  - Left atrium: The atrium was mildly dilated.  - Right atrium: Central venous pressure (est): 3 mm Hg.  - Atrial septum: No defect or patent foramen ovale was identified.  - Tricuspid valve: There was physiologic regurgitation.  - Pulmonary arteries: Systolic pressure could not be accurately    estimated.  - Pericardium, extracardiac: A small pericardial effusion was    identified circumferential to the heart.   Impressions:   - Mild LVH with LVEF 55-60% and grade 1 diastolic dysfunction.    Septal dyssynergy consistent with conduction abnormality. Mild    left atrial enlargement. Mildly sclerotic mitral and aortic    annulus. Small circumferential pericardial effusion noted.   NST: 08/2018 This is a low risk study. Nuclear stress EF: 60%. LBBB seen throughout study. Mild, small fixed septal defect likely due to LBBB.  Assessment:    1. Coronary artery disease involving native coronary artery of native heart without angina pectoris   2. Palpitations   3. Essential hypertension   4. Hyperlipidemia LDL goal <70   5. CKD (chronic kidney disease) stage 4, GFR 15-29 ml/min (HCC)      Plan:   In order of problems listed above:  1. CAD - She is s/p rotational atherectomy  and DES placement to the LAD in 2012. Most  recent ischemic evaluation was a low-risk NST in 08/2018. She denies any recent chest pain or progressive dyspnea. Would plan for a repeat echocardiogram once her schedule allows as outlined below.  - Continue current medication regimen with ASA 81mg  daily, Toprol-XL 50mg  daily, Imdur 120mg  daily and Crestor 5mg  daily.   2. Palpitations - By description, her symptoms seem most consistent with PAC's/PVC's and she did have a PVC on her EKG today and reported feeling the skip at that time. Her symptoms occur throughout the day and she reports minimal symptoms at night. She is currently taking Toprol-XL 25mg  BID and I recommended she try taking the full 50mg  in AM. If symptoms do not improve, could further titrate dosing. Given her more frequent symptoms, I did recommended a repeat echocardiogram for reassessment of any structural abnormalities as recent labs reveal no etiology. Her increased stress is likely playing a role as well.   3. HTN - BP is slightly elevated at 142/70 during today's visit. Will adjust timing of Toprol-XL as outlined above and continue Imdur, Amlodipine and Valsartan (per Nephrology).   4. HLD - Will request most recent labs from her PCP. She has been intolerant to high-intensity statins and remains on Crestor 5mg  daily.   5. Stage 3-4 CKD - Followed by Dr. Theador Hawthorne. Creatinine was stable at 1.87 by recent labs in 05/2021.   Medication Adjustments/Labs and Tests Ordered: Current medicines are reviewed at length with the patient today.  Concerns regarding medicines are outlined above.  Medication changes, Labs and Tests ordered today are listed in the Patient Instructions below. Patient Instructions  Medication Instructions:   Take Toprol XL 50 mg in the AM Daily   *If you need a refill on your cardiac medications before your next appointment, please call your pharmacy*   Lab Work: NONE   If you have labs (blood work) drawn today and your tests are completely normal,  you will receive your results only by: [place] (if you have MyChart) OR A paper copy in the mail If you have any lab test that is abnormal or we need to change your treatment, we will call you to review the results.   Testing/Procedures: Your physician has requested that you have Love echocardiogram. Echocardiography is a painless test that uses sound waves to create images of your heart. It provides your doctor with information about the size and shape of your heart and how well your hearts chambers and valves are working. This procedure takes approximately one hour. There are no restrictions for this procedure.    Follow-Up: At [facility], you and your health needs are our priority.  As part of our continuing mission to provide you with exceptional heart care, we have created designated Provider Care Teams.  These Care Teams include your primary Cardiologist (physician) and Advanced Practice Providers (APPs -  Physician Assistants and Nurse Practitioners) who all work together to provide you with the care you need, when you need it.  We recommend signing up for the patient portal called "MyChart".  Sign up information is provided on this After Visit Summary.  MyChart is used to connect with patients for Virtual Visits (Telemedicine).  Patients are able to view lab/test results, encounter notes, upcoming appointments, etc.  Non-urgent messages can be sent to your provider as well.   To learn more about what you can do with MyChart, go to [email or web address].    Your next appointment:  6 month(s)  The format for your next appointment:   In Person  Provider:   Carlyle Dolly, MD    Other Instructions Thank you for choosing [facility]!      Signed, Erma Heritage, PA-C  06/19/2021 2:17 PM    [facility] [address] [place], [place] [address] Phone: [phone] Fax: [phone]

## 2021-06-18 ENCOUNTER — Ambulatory Visit: Payer: Medicare Other | Admitting: Student

## 2021-06-18 ENCOUNTER — Encounter: Payer: Self-pay | Admitting: Student

## 2021-06-18 ENCOUNTER — Other Ambulatory Visit: Payer: Self-pay

## 2021-06-18 VITALS — BP 142/70 | HR 76 | Ht 62.0 in | Wt 156.0 lb

## 2021-06-18 DIAGNOSIS — I10 Essential (primary) hypertension: Secondary | ICD-10-CM | POA: Diagnosis not present

## 2021-06-18 DIAGNOSIS — I2510 Atherosclerotic heart disease of native coronary artery without angina pectoris: Secondary | ICD-10-CM

## 2021-06-18 DIAGNOSIS — N184 Chronic kidney disease, stage 4 (severe): Secondary | ICD-10-CM | POA: Diagnosis not present

## 2021-06-18 DIAGNOSIS — R002 Palpitations: Secondary | ICD-10-CM | POA: Diagnosis not present

## 2021-06-18 DIAGNOSIS — E785 Hyperlipidemia, unspecified: Secondary | ICD-10-CM

## 2021-06-18 MED ORDER — METOPROLOL SUCCINATE ER 50 MG PO TB24: 50.0000 mg | ORAL_TABLET | Freq: Every day | ORAL | 3 refills | Status: DC

## 2021-06-18 NOTE — Patient Instructions (Signed)
Medication Instructions:   Take Toprol XL 50 mg in the AM Daily   *If you need a refill on your cardiac medications before your next appointment, please call your pharmacy*   Lab Work: NONE   If you have labs (blood work) drawn today and your tests are completely normal, you will receive your results only by: [place] (if you have MyChart) OR A paper copy in the mail If you have any lab test that is abnormal or we need to change your treatment, we will call you to review the results.   Testing/Procedures: Your physician has requested that you have an echocardiogram. Echocardiography is a painless test that uses sound waves to create images of your heart. It provides your doctor with information about the size and shape of your heart and how well your hearts chambers and valves are working. This procedure takes approximately one hour. There are no restrictions for this procedure.    Follow-Up: At [facility], you and your health needs are our priority.  As part of our continuing mission to provide you with exceptional heart care, we have created designated Provider Care Teams.  These Care Teams include your primary Cardiologist (physician) and Advanced Practice Providers (APPs -  Physician Assistants and Nurse Practitioners) who all work together to provide you with the care you need, when you need it.  We recommend signing up for the patient portal called "MyChart".  Sign up information is provided on this After Visit Summary.  MyChart is used to connect with patients for Virtual Visits (Telemedicine).  Patients are able to view lab/test results, encounter notes, upcoming appointments, etc.  Non-urgent messages can be sent to your provider as well.   To learn more about what you can do with MyChart, go to [email or web address].    Your next appointment:   6 month(s)  The format for your next appointment:   In Person  Provider:   Carlyle Dolly, MD    Other  Instructions Thank you for choosing [facility]!

## 2021-06-19 ENCOUNTER — Encounter: Payer: Self-pay | Admitting: Student

## 2021-06-22 ENCOUNTER — Ambulatory Visit: Payer: Medicare Other | Admitting: Student

## 2021-06-23 DIAGNOSIS — I129 Hypertensive chronic kidney disease with stage 1 through stage 4 chronic kidney disease, or unspecified chronic kidney disease: Secondary | ICD-10-CM | POA: Diagnosis not present

## 2021-06-23 DIAGNOSIS — N184 Chronic kidney disease, stage 4 (severe): Secondary | ICD-10-CM | POA: Diagnosis not present

## 2021-06-23 DIAGNOSIS — E538 Deficiency of other specified B group vitamins: Secondary | ICD-10-CM | POA: Diagnosis not present

## 2021-06-23 DIAGNOSIS — I5032 Chronic diastolic (congestive) heart failure: Secondary | ICD-10-CM | POA: Diagnosis not present

## 2021-06-23 DIAGNOSIS — R809 Proteinuria, unspecified: Secondary | ICD-10-CM | POA: Diagnosis not present

## 2021-06-28 DIAGNOSIS — E538 Deficiency of other specified B group vitamins: Secondary | ICD-10-CM | POA: Diagnosis not present

## 2021-06-28 DIAGNOSIS — R718 Other abnormality of red blood cells: Secondary | ICD-10-CM | POA: Diagnosis not present

## 2021-06-28 DIAGNOSIS — D631 Anemia in chronic kidney disease: Secondary | ICD-10-CM | POA: Diagnosis not present

## 2021-06-28 DIAGNOSIS — I5032 Chronic diastolic (congestive) heart failure: Secondary | ICD-10-CM | POA: Diagnosis not present

## 2021-06-28 DIAGNOSIS — I129 Hypertensive chronic kidney disease with stage 1 through stage 4 chronic kidney disease, or unspecified chronic kidney disease: Secondary | ICD-10-CM | POA: Diagnosis not present

## 2021-06-28 DIAGNOSIS — D510 Vitamin B12 deficiency anemia due to intrinsic factor deficiency: Secondary | ICD-10-CM | POA: Diagnosis not present

## 2021-06-28 DIAGNOSIS — N184 Chronic kidney disease, stage 4 (severe): Secondary | ICD-10-CM | POA: Diagnosis not present

## 2021-06-28 DIAGNOSIS — N189 Chronic kidney disease, unspecified: Secondary | ICD-10-CM | POA: Diagnosis not present

## 2021-06-28 DIAGNOSIS — R809 Proteinuria, unspecified: Secondary | ICD-10-CM | POA: Diagnosis not present

## 2021-07-29 ENCOUNTER — Ambulatory Visit (HOSPITAL_COMMUNITY)
Admission: RE | Admit: 2021-07-29 | Discharge: 2021-07-29 | Disposition: A | Discharge status: Home / Self Care | Payer: Medicare Other | Source: Ambulatory Visit | Attending: Student | Admitting: Student

## 2021-07-29 DIAGNOSIS — I2510 Atherosclerotic heart disease of native coronary artery without angina pectoris: Secondary | ICD-10-CM | POA: Insufficient documentation

## 2021-07-29 DIAGNOSIS — E785 Hyperlipidemia, unspecified: Secondary | ICD-10-CM | POA: Insufficient documentation

## 2021-07-29 DIAGNOSIS — I119 Hypertensive heart disease without heart failure: Secondary | ICD-10-CM | POA: Insufficient documentation

## 2021-07-29 DIAGNOSIS — I447 Left bundle-branch block, unspecified: Secondary | ICD-10-CM | POA: Diagnosis not present

## 2021-07-29 DIAGNOSIS — I3139 Other pericardial effusion (noninflammatory): Secondary | ICD-10-CM | POA: Diagnosis not present

## 2021-07-29 DIAGNOSIS — R002 Palpitations: Secondary | ICD-10-CM | POA: Diagnosis not present

## 2021-07-29 DIAGNOSIS — D510 Vitamin B12 deficiency anemia due to intrinsic factor deficiency: Secondary | ICD-10-CM | POA: Diagnosis not present

## 2021-07-29 LAB — ECHOCARDIOGRAM COMPLETE
Area-P 1/2: 5.38 cm2
S' Lateral: 3.3 cm

## 2021-07-29 NOTE — Progress Notes (Signed)
*  PRELIMINARY RESULTS* ?Echocardiogram ?2D Echocardiogram has been performed. ? ?Samuel Germany ?07/29/2021, 1:41 PM ?

## 2021-08-06 ENCOUNTER — Other Ambulatory Visit: Payer: Self-pay | Admitting: Cardiology

## 2021-08-23 DIAGNOSIS — R809 Proteinuria, unspecified: Secondary | ICD-10-CM | POA: Diagnosis not present

## 2021-08-23 DIAGNOSIS — D510 Vitamin B12 deficiency anemia due to intrinsic factor deficiency: Secondary | ICD-10-CM | POA: Diagnosis not present

## 2021-08-23 DIAGNOSIS — N184 Chronic kidney disease, stage 4 (severe): Secondary | ICD-10-CM | POA: Diagnosis not present

## 2021-08-23 DIAGNOSIS — D631 Anemia in chronic kidney disease: Secondary | ICD-10-CM | POA: Diagnosis not present

## 2021-08-23 DIAGNOSIS — I129 Hypertensive chronic kidney disease with stage 1 through stage 4 chronic kidney disease, or unspecified chronic kidney disease: Secondary | ICD-10-CM | POA: Diagnosis not present

## 2021-08-23 DIAGNOSIS — I5032 Chronic diastolic (congestive) heart failure: Secondary | ICD-10-CM | POA: Diagnosis not present

## 2021-08-26 DIAGNOSIS — N189 Chronic kidney disease, unspecified: Secondary | ICD-10-CM | POA: Diagnosis not present

## 2021-08-26 DIAGNOSIS — R809 Proteinuria, unspecified: Secondary | ICD-10-CM | POA: Diagnosis not present

## 2021-08-26 DIAGNOSIS — D631 Anemia in chronic kidney disease: Secondary | ICD-10-CM | POA: Diagnosis not present

## 2021-08-26 DIAGNOSIS — I129 Hypertensive chronic kidney disease with stage 1 through stage 4 chronic kidney disease, or unspecified chronic kidney disease: Secondary | ICD-10-CM | POA: Diagnosis not present

## 2021-08-26 DIAGNOSIS — N184 Chronic kidney disease, stage 4 (severe): Secondary | ICD-10-CM | POA: Diagnosis not present

## 2021-08-26 DIAGNOSIS — I5033 Acute on chronic diastolic (congestive) heart failure: Secondary | ICD-10-CM | POA: Diagnosis not present

## 2021-09-09 ENCOUNTER — Telehealth: Payer: Self-pay | Admitting: Student

## 2021-09-09 NOTE — Telephone Encounter (Signed)
New Message: ? ? ? ?Patient is calling to see if Bernerd Pho wants her to have another Echo? ?

## 2021-09-09 NOTE — Telephone Encounter (Signed)
Pt had echo completed in 07/2021. Voiced to pt that we are not pursuing a new echo at this time. Pt verbalized understanding.  ?

## 2021-09-24 DIAGNOSIS — D631 Anemia in chronic kidney disease: Secondary | ICD-10-CM | POA: Diagnosis not present

## 2021-09-24 DIAGNOSIS — I129 Hypertensive chronic kidney disease with stage 1 through stage 4 chronic kidney disease, or unspecified chronic kidney disease: Secondary | ICD-10-CM | POA: Diagnosis not present

## 2021-09-24 DIAGNOSIS — N184 Chronic kidney disease, stage 4 (severe): Secondary | ICD-10-CM | POA: Diagnosis not present

## 2021-09-24 DIAGNOSIS — N189 Chronic kidney disease, unspecified: Secondary | ICD-10-CM | POA: Diagnosis not present

## 2021-09-24 DIAGNOSIS — R809 Proteinuria, unspecified: Secondary | ICD-10-CM | POA: Diagnosis not present

## 2021-09-24 DIAGNOSIS — D510 Vitamin B12 deficiency anemia due to intrinsic factor deficiency: Secondary | ICD-10-CM | POA: Diagnosis not present

## 2021-09-29 DIAGNOSIS — R809 Proteinuria, unspecified: Secondary | ICD-10-CM | POA: Diagnosis not present

## 2021-09-29 DIAGNOSIS — I129 Hypertensive chronic kidney disease with stage 1 through stage 4 chronic kidney disease, or unspecified chronic kidney disease: Secondary | ICD-10-CM | POA: Diagnosis not present

## 2021-09-29 DIAGNOSIS — N189 Chronic kidney disease, unspecified: Secondary | ICD-10-CM | POA: Diagnosis not present

## 2021-09-29 DIAGNOSIS — I5032 Chronic diastolic (congestive) heart failure: Secondary | ICD-10-CM | POA: Diagnosis not present

## 2021-09-29 DIAGNOSIS — N184 Chronic kidney disease, stage 4 (severe): Secondary | ICD-10-CM | POA: Diagnosis not present

## 2021-09-29 DIAGNOSIS — D631 Anemia in chronic kidney disease: Secondary | ICD-10-CM | POA: Diagnosis not present

## 2021-10-06 DIAGNOSIS — I10 Essential (primary) hypertension: Secondary | ICD-10-CM | POA: Diagnosis not present

## 2021-10-06 DIAGNOSIS — E785 Hyperlipidemia, unspecified: Secondary | ICD-10-CM | POA: Diagnosis not present

## 2021-10-06 DIAGNOSIS — I2510 Atherosclerotic heart disease of native coronary artery without angina pectoris: Secondary | ICD-10-CM | POA: Diagnosis not present

## 2021-10-06 DIAGNOSIS — M1991 Primary osteoarthritis, unspecified site: Secondary | ICD-10-CM | POA: Diagnosis not present

## 2021-10-06 DIAGNOSIS — N184 Chronic kidney disease, stage 4 (severe): Secondary | ICD-10-CM | POA: Diagnosis not present

## 2021-10-06 DIAGNOSIS — D510 Vitamin B12 deficiency anemia due to intrinsic factor deficiency: Secondary | ICD-10-CM | POA: Diagnosis not present

## 2021-10-06 DIAGNOSIS — D509 Iron deficiency anemia, unspecified: Secondary | ICD-10-CM | POA: Diagnosis not present

## 2021-10-13 DIAGNOSIS — N189 Chronic kidney disease, unspecified: Secondary | ICD-10-CM | POA: Diagnosis not present

## 2021-10-13 DIAGNOSIS — D631 Anemia in chronic kidney disease: Secondary | ICD-10-CM | POA: Diagnosis not present

## 2021-10-13 DIAGNOSIS — R809 Proteinuria, unspecified: Secondary | ICD-10-CM | POA: Diagnosis not present

## 2021-10-13 DIAGNOSIS — N184 Chronic kidney disease, stage 4 (severe): Secondary | ICD-10-CM | POA: Diagnosis not present

## 2021-10-13 DIAGNOSIS — I5032 Chronic diastolic (congestive) heart failure: Secondary | ICD-10-CM | POA: Diagnosis not present

## 2021-11-02 DIAGNOSIS — D510 Vitamin B12 deficiency anemia due to intrinsic factor deficiency: Secondary | ICD-10-CM | POA: Diagnosis not present

## 2021-11-24 DIAGNOSIS — D510 Vitamin B12 deficiency anemia due to intrinsic factor deficiency: Secondary | ICD-10-CM | POA: Diagnosis not present

## 2021-12-09 DIAGNOSIS — N184 Chronic kidney disease, stage 4 (severe): Secondary | ICD-10-CM | POA: Diagnosis not present

## 2021-12-09 DIAGNOSIS — I5032 Chronic diastolic (congestive) heart failure: Secondary | ICD-10-CM | POA: Diagnosis not present

## 2021-12-09 DIAGNOSIS — R809 Proteinuria, unspecified: Secondary | ICD-10-CM | POA: Diagnosis not present

## 2021-12-09 DIAGNOSIS — N189 Chronic kidney disease, unspecified: Secondary | ICD-10-CM | POA: Diagnosis not present

## 2021-12-09 DIAGNOSIS — D631 Anemia in chronic kidney disease: Secondary | ICD-10-CM | POA: Diagnosis not present

## 2021-12-15 DIAGNOSIS — N184 Chronic kidney disease, stage 4 (severe): Secondary | ICD-10-CM | POA: Diagnosis not present

## 2021-12-15 DIAGNOSIS — I129 Hypertensive chronic kidney disease with stage 1 through stage 4 chronic kidney disease, or unspecified chronic kidney disease: Secondary | ICD-10-CM | POA: Diagnosis not present

## 2021-12-15 DIAGNOSIS — I5032 Chronic diastolic (congestive) heart failure: Secondary | ICD-10-CM | POA: Diagnosis not present

## 2021-12-15 DIAGNOSIS — E538 Deficiency of other specified B group vitamins: Secondary | ICD-10-CM | POA: Diagnosis not present

## 2021-12-15 DIAGNOSIS — R809 Proteinuria, unspecified: Secondary | ICD-10-CM | POA: Diagnosis not present

## 2021-12-15 DIAGNOSIS — D539 Nutritional anemia, unspecified: Secondary | ICD-10-CM | POA: Diagnosis not present

## 2021-12-27 DIAGNOSIS — D510 Vitamin B12 deficiency anemia due to intrinsic factor deficiency: Secondary | ICD-10-CM | POA: Diagnosis not present

## 2022-01-14 ENCOUNTER — Encounter: Payer: Self-pay | Admitting: Cardiology

## 2022-01-14 ENCOUNTER — Ambulatory Visit: Payer: Medicare Other | Attending: Cardiology | Admitting: Cardiology

## 2022-01-14 VITALS — BP 144/65 | HR 61 | Ht 62.0 in | Wt 155.6 lb

## 2022-01-14 DIAGNOSIS — I10 Essential (primary) hypertension: Secondary | ICD-10-CM

## 2022-01-14 DIAGNOSIS — R002 Palpitations: Secondary | ICD-10-CM

## 2022-01-14 DIAGNOSIS — I2510 Atherosclerotic heart disease of native coronary artery without angina pectoris: Secondary | ICD-10-CM

## 2022-01-14 MED ORDER — METOPROLOL SUCCINATE ER 50 MG PO TB24: 75.0000 mg | ORAL_TABLET | Freq: Every day | ORAL | 3 refills | Status: DC

## 2022-01-14 NOTE — Progress Notes (Signed)
Clinical Summary [name] is a 88 y.o.female seen today for follow up of the following medical problems.    1. CAD - history of PCI to LAD - underwent a low risk nuclear stress test on 08/29/2018, EF 60%.    07/2021 echo: LVEF 60-65%, no WMAs, grade I dd, normal, low normal RV function, severe LAE - no chest pains.    2. Chronic LBBB     3. HTN - compliant with meds - recent chagnes make by neprhology, she reports bp's got too low and went back to prior regimen.    4. Hyperlipidemia - compliant with meds   5. Palpitations - occasonial palpitatons. Occurs 1-2 times daily, just a few seconds.  - limited caffeine.    6. CKD IV - followed by Dr Theador Hawthorne     Husband with home hospice, liver cancer.  Past Medical History:  Diagnosis Date   Anemia    Anxiety    Arthritis    Bleeding from the nose Oct 2014   CAD (coronary artery disease)    a. s/p rotational atherectomy and DES placement to LAD in 2012 b. low-risk NST in 08/2018   Cataract of both eyes    Chronic kidney disease    Colon polyps    Hyperlipidemia    Hypertension    x 5-6 yrs.   LBBB (left bundle [place] block)    Renal insufficiency      Allergies  Allergen Reactions   Macrobid [Nitrofurantoin] Nausea And Vomiting     Current Outpatient Medications  Medication Sig Dispense Refill   ALPRAZolam (XANAX) 0.5 MG tablet Take 0.5 mg by mouth 3 (three) times daily as needed.     amLODipine (NORVASC) 10 MG tablet Take 1 tablet by mouth once daily 90 tablet 0   aspirin EC 81 MG tablet Take 81 mg by mouth daily. Swallow whole.     conjugated estrogens (PREMARIN) vaginal cream Place 1 Applicatorful vaginally daily. 42.5 g 12   Cyanocobalamin 1000 MCG/ML KIT Inject as directed every 30 (thirty) days     FLAXSEED, LINSEED, PO Take 1 capsule by mouth daily.      furosemide (LASIX) 40 MG tablet Take 40 mg by mouth as needed.      isosorbide mononitrate (IMDUR) 120 MG 24 hr tablet Take 1 tablet by mouth  once daily 90 tablet 3   metoprolol succinate (TOPROL-XL) 50 MG 24 hr tablet Take 1 tablet (50 mg total) by mouth daily. Take with or immediately following a meal. Take in the Morning 90 tablet 3   nitroGLYCERIN (NITROSTAT) 0.4 MG SL tablet Place 1 tablet (0.4 mg total) under the tongue every 5 (five) minutes as needed for chest pain. 25 tablet 3   rosuvastatin (CRESTOR) 5 MG tablet Take 1 tablet (5 mg total) by mouth daily. 90 tablet 1   sodium bicarbonate 650 MG tablet Take 650 mg by mouth 4 (four) times daily.     Vitamin D, Ergocalciferol, (DRISDOL) 50000 units CAPS capsule TAKE 1 CAPSULE BY MOUTH ONCE A WEEK PT TO TAKE 2000 UNITS OF VITAMIN D OVER THE COUNTER DAILY AFTER THIS RX IS FINISHED  0   valsartan (DIOVAN) 40 MG tablet Take 40 mg by mouth daily. (Patient not taking: Reported on 01/14/2022)     No current facility-administered medications for this visit.     Past Surgical History:  Procedure Laterality Date   BREAST SURGERY     masses removed while she was  pregnant   CARDIAC CATHETERIZATION  06/20/2008   severe single vessel high-grade stenosis of mid LAD at bifurcation of diagonal 1 and setpal perforator 1; diffuse coronary calcification of L coronary system; 50% stenosis of mid RCA (Dr. Jackie Plum)   CATARACT EXTRACTION W/PHACO Right 10/10/2013   Procedure: CATARACT EXTRACTION PHACO AND INTRAOCULAR LENS PLACEMENT ([place]);  Surgeon: Tonny Anjel Perfetti, MD;  [facility];  Service: Ophthalmology;  Laterality: Right;  CDE:11.96   CATARACT EXTRACTION W/PHACO Left 11/18/2013   Procedure: CATARACT EXTRACTION PHACO AND INTRAOCULAR LENS PLACEMENT ([facility]);  Surgeon: Tonny Percy Comp, MD;  Location: [facility];  Service: Ophthalmology;  Laterality: Left;  CDE 15.96   COLONOSCOPY N/A 01/16/2013   Procedure: COLONOSCOPY;  Surgeon: Rogene Houston, MD;  [facility];  Service: Endoscopy;  Laterality: N/A;  1200   CORONARY ANGIOPLASTY WITH STENT PLACEMENT  11/08/2010   complex high-speed rotational  atherectomy of calcified LAD, diffusely disease LAD system (Dr. Corky Downs)   EXCISION ORAL TUMOR Right 01/14/2014   Procedure: EXCISION OROPHARYNGEAL MASS;  Surgeon: Ascencion Dike, MD;  Location: [facility];  Service: ENT;  Laterality: Right;   NM MYOCAR [place]  05/2011   lexiscan myoview; normal pattern of perfusion in all regions; post-stress EF 55%; low risk scan    PARTIAL HYSTERECTOMY     TONSILLECTOMY     TRANSTHORACIC ECHOCARDIOGRAM  10/2010   EF 45-50%, mod conc LVH, grade 1 diastolic dysfunction, mildly calcified left AV cusp; calcified MV annulus     Allergies  Allergen Reactions   Macrobid [Nitrofurantoin] Nausea And Vomiting      Family History  Problem Relation Age of Onset   Liver cancer Mother    Cancer Father    Heart Problems Brother    Coronary artery disease Sister        stent   Heart Problems Daughter        also stomach problems   COPD Daughter    Colon cancer Neg Hx      Social History Ms. Notch reports that she has never smoked. She has never used smokeless tobacco. Ms. Pardo reports no history of alcohol use.   Review of Systems CONSTITUTIONAL: No weight loss, fever, chills, weakness or fatigue.  HEENT: Eyes: No visual loss, blurred vision, double vision or yellow sclerae.No hearing loss, sneezing, congestion, runny nose or sore throat.  SKIN: No rash or itching.  CARDIOVASCULAR:per hpi  RESPIRATORY: No shortness of breath, cough or sputum.  GASTROINTESTINAL: No anorexia, nausea, vomiting or diarrhea. No abdominal pain or blood.  GENITOURINARY: No burning on urination, no polyuria NEUROLOGICAL: No headache, dizziness, syncope, paralysis, ataxia, numbness or tingling in the extremities. No change in bowel or bladder control.  MUSCULOSKELETAL: No muscle, back pain, joint pain or stiffness.  LYMPHATICS: No enlarged nodes. No history of splenectomy.  PSYCHIATRIC: No history of depression or anxiety.  ENDOCRINOLOGIC: No reports of  sweating, cold or heat intolerance. No polyuria or polydipsia.  Marland Kitchen   Physical Examination Today's Vitals   01/14/22 1403 01/14/22 1429  BP: (!) 152/68 (!) 144/65  Pulse: 61   SpO2: 95%   Weight: 155 lb 9.6 oz (70.6 kg)   Height: '5\' 2"'  (1.575 m)    Body mass index is 28.46 kg/m.  Gen: resting comfortably, no acute distress HEENT: no scleral icterus, pupils equal round and reactive, no palptable cervical adenopathy,  CV: RRR, no m/r/g no jvd Resp: Clear to auscultation bilaterally GI: abdomen is soft, non-tender,  non-distended, normal bowel sounds, no hepatosplenomegaly MSK: extremities are warm, no edema.  Skin: warm, no rash Neuro:  no focal deficits Psych: appropriate affect   Diagnostic Studies  07/2021 echo  IMPRESSIONS     1. Left ventricular ejection fraction, by estimation, is 60 to 65%. The  left ventricle has normal function. The left ventricle has no regional  wall motion abnormalities. There is moderate left ventricular hypertrophy.  Left ventricular diastolic  parameters are consistent with Grade I diastolic dysfunction (impaired  relaxation).   2. Right ventricular systolic function is low normal. The right  ventricular size is normal. There is normal pulmonary artery systolic  pressure. The estimated right ventricular systolic pressure is 63.7 mmHg.   3. Left atrial size was severely dilated.   4. A small pericardial effusion is present. The pericardial effusion is  circumferential. There is no evidence of cardiac tamponade.   5. The mitral valve is abnormal. Trivial mitral valve regurgitation.   6. The aortic valve is tricuspid. Aortic valve regurgitation is not  visualized.   7. The inferior vena cava is normal in size with greater than 50%  respiratory variability, suggesting right atrial pressure of 3 mmHg.   Assessment and Plan   1. CAD - denies any symptoms, continue current meds   2. HTN - have deferred to neprhology who has made some recent  changes and to avoid complicating her management. Overall management complicated by significant renal disease.    3. Palpitaitons - ongoing, though fairly mild. Increase toprol to 59m daily.    EKG today shows SR, chronic LBBB, 1st degre AV block     JArnoldo Lenis M.D.

## 2022-01-14 NOTE — Patient Instructions (Signed)
Medication Instructions:  Your physician has recommended you make the following change in your medication:   - Increase Toprol XL to 75 mg tablets daily   Labwork: None  Testing/Procedures: None  Follow-Up: Follow up with Dr. Harl Bowie in 6 months.   Any Other Special Instructions Will Be Listed Below (If Applicable).     If you need a refill on your cardiac medications before your next appointment, please call your pharmacy.

## 2022-01-14 NOTE — Addendum Note (Signed)
Addended by: Berlinda Last on: 01/14/2022 02:32 PM   Modules accepted: Orders

## 2022-01-24 ENCOUNTER — Other Ambulatory Visit (HOSPITAL_COMMUNITY): Payer: Self-pay | Admitting: Family Medicine

## 2022-01-25 DIAGNOSIS — D510 Vitamin B12 deficiency anemia due to intrinsic factor deficiency: Secondary | ICD-10-CM | POA: Diagnosis not present

## 2022-02-10 DIAGNOSIS — I10 Essential (primary) hypertension: Secondary | ICD-10-CM | POA: Diagnosis not present

## 2022-02-10 DIAGNOSIS — N644 Mastodynia: Secondary | ICD-10-CM | POA: Diagnosis not present

## 2022-02-10 DIAGNOSIS — Z0001 Encounter for general adult medical examination with abnormal findings: Secondary | ICD-10-CM | POA: Diagnosis not present

## 2022-02-10 DIAGNOSIS — D510 Vitamin B12 deficiency anemia due to intrinsic factor deficiency: Secondary | ICD-10-CM | POA: Diagnosis not present

## 2022-02-10 DIAGNOSIS — E785 Hyperlipidemia, unspecified: Secondary | ICD-10-CM | POA: Diagnosis not present

## 2022-02-10 DIAGNOSIS — M810 Age-related osteoporosis without current pathological fracture: Secondary | ICD-10-CM | POA: Diagnosis not present

## 2022-02-10 DIAGNOSIS — D509 Iron deficiency anemia, unspecified: Secondary | ICD-10-CM | POA: Diagnosis not present

## 2022-02-10 DIAGNOSIS — M1991 Primary osteoarthritis, unspecified site: Secondary | ICD-10-CM | POA: Diagnosis not present

## 2022-02-16 ENCOUNTER — Other Ambulatory Visit (HOSPITAL_COMMUNITY): Payer: Self-pay | Admitting: Family Medicine

## 2022-02-16 DIAGNOSIS — N644 Mastodynia: Secondary | ICD-10-CM

## 2022-02-22 DIAGNOSIS — D510 Vitamin B12 deficiency anemia due to intrinsic factor deficiency: Secondary | ICD-10-CM | POA: Diagnosis not present

## 2022-02-24 DIAGNOSIS — E538 Deficiency of other specified B group vitamins: Secondary | ICD-10-CM | POA: Diagnosis not present

## 2022-02-24 DIAGNOSIS — I5032 Chronic diastolic (congestive) heart failure: Secondary | ICD-10-CM | POA: Diagnosis not present

## 2022-02-24 DIAGNOSIS — I129 Hypertensive chronic kidney disease with stage 1 through stage 4 chronic kidney disease, or unspecified chronic kidney disease: Secondary | ICD-10-CM | POA: Diagnosis not present

## 2022-02-24 DIAGNOSIS — R809 Proteinuria, unspecified: Secondary | ICD-10-CM | POA: Diagnosis not present

## 2022-02-24 DIAGNOSIS — N184 Chronic kidney disease, stage 4 (severe): Secondary | ICD-10-CM | POA: Diagnosis not present

## 2022-03-01 ENCOUNTER — Ambulatory Visit (HOSPITAL_COMMUNITY)
Admission: RE | Admit: 2022-03-01 | Discharge: 2022-03-01 | Disposition: A | Discharge status: Home / Self Care | Payer: Medicare Other | Source: Ambulatory Visit | Attending: Family Medicine | Admitting: Family Medicine

## 2022-03-01 DIAGNOSIS — N644 Mastodynia: Secondary | ICD-10-CM | POA: Diagnosis not present

## 2022-03-02 DIAGNOSIS — Z7689 Persons encountering health services in other specified circumstances: Secondary | ICD-10-CM | POA: Diagnosis not present

## 2022-03-02 DIAGNOSIS — N189 Chronic kidney disease, unspecified: Secondary | ICD-10-CM | POA: Diagnosis not present

## 2022-03-02 DIAGNOSIS — I5032 Chronic diastolic (congestive) heart failure: Secondary | ICD-10-CM | POA: Diagnosis not present

## 2022-03-02 DIAGNOSIS — N184 Chronic kidney disease, stage 4 (severe): Secondary | ICD-10-CM | POA: Diagnosis not present

## 2022-03-02 DIAGNOSIS — D631 Anemia in chronic kidney disease: Secondary | ICD-10-CM | POA: Diagnosis not present

## 2022-03-02 DIAGNOSIS — I129 Hypertensive chronic kidney disease with stage 1 through stage 4 chronic kidney disease, or unspecified chronic kidney disease: Secondary | ICD-10-CM | POA: Diagnosis not present

## 2022-03-02 DIAGNOSIS — D539 Nutritional anemia, unspecified: Secondary | ICD-10-CM | POA: Diagnosis not present

## 2022-03-02 DIAGNOSIS — E8722 Chronic metabolic acidosis: Secondary | ICD-10-CM | POA: Diagnosis not present

## 2022-03-02 DIAGNOSIS — R809 Proteinuria, unspecified: Secondary | ICD-10-CM | POA: Diagnosis not present

## 2022-03-14 ENCOUNTER — Telehealth: Payer: Self-pay | Admitting: Cardiology

## 2022-03-14 DIAGNOSIS — I10 Essential (primary) hypertension: Secondary | ICD-10-CM | POA: Diagnosis not present

## 2022-03-14 NOTE — Telephone Encounter (Signed)
Pt c/o BP issue: STAT if pt c/o blurred vision, one-sided weakness or slurred speech  1. What are your last 5 BP readings?  189/71 - This morning 191/66 - This Afternoon 205/65 - Yesterday  2. Are you having any other symptoms (ex. Dizziness, headache, blurred vision, passed out)? Dizzy  3. What is your BP issue? Pt states that BP has been elevated for the last 2 to 3 days. Pt would like a callback regarding this matter.

## 2022-03-14 NOTE — Telephone Encounter (Signed)
Patient states that since her September office visit with Dr.Branch, she IS taking Diovan 40 mg daily and Metoprolol 75 mg daily.  Her nephrologist stopped Amlodipine.  She reports her BP has been up since 10/22  She went to Urgent care this am and her BP was 175/63, HR 56 after pcp had no openings.   She states UC told her they could not make medication changes until they spoke with her doctor, so, she left there w/o any medication changes.    I will forward to Dr.Branch for instructions.

## 2022-03-15 NOTE — Telephone Encounter (Signed)
Patient walked in this am and I discussed what Dr.Branch had said. She states she will stop by her nephrologist office this am.

## 2022-03-15 NOTE — Telephone Encounter (Signed)
I would touch base with her nephrologist who had made the most recent changes. Sometimes for blood pressure too many cooks in the kitchen can complicate things, particularly given her kidney function and how that affects bp med options.  Zandra Abts MD

## 2022-03-22 DIAGNOSIS — D510 Vitamin B12 deficiency anemia due to intrinsic factor deficiency: Secondary | ICD-10-CM | POA: Diagnosis not present

## 2022-04-26 DIAGNOSIS — M1711 Unilateral primary osteoarthritis, right knee: Secondary | ICD-10-CM | POA: Diagnosis not present

## 2022-04-26 DIAGNOSIS — D510 Vitamin B12 deficiency anemia due to intrinsic factor deficiency: Secondary | ICD-10-CM | POA: Diagnosis not present

## 2022-04-30 ENCOUNTER — Other Ambulatory Visit: Payer: Self-pay | Admitting: Cardiology

## 2022-05-18 DIAGNOSIS — N184 Chronic kidney disease, stage 4 (severe): Secondary | ICD-10-CM | POA: Diagnosis not present

## 2022-05-18 DIAGNOSIS — I129 Hypertensive chronic kidney disease with stage 1 through stage 4 chronic kidney disease, or unspecified chronic kidney disease: Secondary | ICD-10-CM | POA: Diagnosis not present

## 2022-05-18 DIAGNOSIS — E8722 Chronic metabolic acidosis: Secondary | ICD-10-CM | POA: Diagnosis not present

## 2022-05-18 DIAGNOSIS — I5032 Chronic diastolic (congestive) heart failure: Secondary | ICD-10-CM | POA: Diagnosis not present

## 2022-05-18 DIAGNOSIS — R809 Proteinuria, unspecified: Secondary | ICD-10-CM | POA: Diagnosis not present

## 2022-05-26 DIAGNOSIS — D510 Vitamin B12 deficiency anemia due to intrinsic factor deficiency: Secondary | ICD-10-CM | POA: Diagnosis not present

## 2022-06-15 ENCOUNTER — Other Ambulatory Visit (HOSPITAL_COMMUNITY): Payer: Self-pay | Admitting: Family Medicine

## 2022-06-15 ENCOUNTER — Ambulatory Visit (HOSPITAL_COMMUNITY)
Admission: RE | Admit: 2022-06-15 | Discharge: 2022-06-15 | Disposition: A | Discharge status: Home / Self Care | Payer: Medicare Other | Source: Ambulatory Visit | Attending: Family Medicine | Admitting: Family Medicine

## 2022-06-15 DIAGNOSIS — M1711 Unilateral primary osteoarthritis, right knee: Secondary | ICD-10-CM

## 2022-06-15 DIAGNOSIS — S8991XA Unspecified injury of right lower leg, initial encounter: Secondary | ICD-10-CM | POA: Diagnosis not present

## 2022-06-15 DIAGNOSIS — E538 Deficiency of other specified B group vitamins: Secondary | ICD-10-CM | POA: Diagnosis not present

## 2022-06-15 DIAGNOSIS — M25561 Pain in right knee: Secondary | ICD-10-CM | POA: Diagnosis not present

## 2022-06-15 DIAGNOSIS — Z8739 Personal history of other diseases of the musculoskeletal system and connective tissue: Secondary | ICD-10-CM | POA: Diagnosis not present

## 2022-06-23 DIAGNOSIS — N184 Chronic kidney disease, stage 4 (severe): Secondary | ICD-10-CM | POA: Diagnosis not present

## 2022-06-23 DIAGNOSIS — I10 Essential (primary) hypertension: Secondary | ICD-10-CM | POA: Diagnosis not present

## 2022-06-23 DIAGNOSIS — I129 Hypertensive chronic kidney disease with stage 1 through stage 4 chronic kidney disease, or unspecified chronic kidney disease: Secondary | ICD-10-CM | POA: Diagnosis not present

## 2022-06-23 DIAGNOSIS — E8722 Chronic metabolic acidosis: Secondary | ICD-10-CM | POA: Diagnosis not present

## 2022-06-23 DIAGNOSIS — I5032 Chronic diastolic (congestive) heart failure: Secondary | ICD-10-CM | POA: Diagnosis not present

## 2022-06-23 DIAGNOSIS — R809 Proteinuria, unspecified: Secondary | ICD-10-CM | POA: Diagnosis not present

## 2022-06-27 ENCOUNTER — Encounter: Payer: Self-pay | Admitting: Cardiology

## 2022-06-27 ENCOUNTER — Ambulatory Visit: Payer: Medicare Other | Attending: Cardiology | Admitting: Cardiology

## 2022-06-27 ENCOUNTER — Other Ambulatory Visit: Payer: Self-pay | Admitting: Cardiology

## 2022-06-27 ENCOUNTER — Ambulatory Visit: Payer: Medicare Other | Attending: Cardiology

## 2022-06-27 VITALS — BP 146/62 | HR 95 | Ht 62.0 in | Wt 165.0 lb

## 2022-06-27 DIAGNOSIS — R002 Palpitations: Secondary | ICD-10-CM

## 2022-06-27 DIAGNOSIS — R5383 Other fatigue: Secondary | ICD-10-CM | POA: Diagnosis not present

## 2022-06-27 DIAGNOSIS — R42 Dizziness and giddiness: Secondary | ICD-10-CM | POA: Diagnosis not present

## 2022-06-27 MED ORDER — METOPROLOL SUCCINATE ER 50 MG PO TB24: 50.0000 mg | ORAL_TABLET | Freq: Every day | ORAL | Status: DC

## 2022-06-27 NOTE — Progress Notes (Signed)
Clinical Summary [name] is a 89 y.o.female seen today for follow up of the following medical problems.      This is a focused visit on recent symptoms of fatigue, palpitations, dizziness  1.Fatigue/palpitatons/dizziness - several months of feeling fatigue, low energy - two falls recently, from standing up from bed - Some lightheadedness/dizziness, dizziness when she works gets up in the morning. Some episodes with heart fluttering with activities - 3-4 glasses 16 oz - takes xanax 1/2 tablet at lunch, full one at night   Other medical issues not addressed this visit    1. CAD - history of PCI to LAD - underwent a low risk nuclear stress test on 08/29/2018, EF 60%.     07/2021 echo: LVEF 60-65%, no WMAs, grade I dd, normal, low normal RV function, severe LAE - no chest pains.    2. Chronic LBBB     3. HTN - compliant with meds - recent chagnes make by neprhology, she reports bp's got too low and went back to prior regimen.    4. Hyperlipidemia - compliant with meds   5. Palpitations - occasonial palpitatons. Occurs 1-2 times daily, just a few seconds.  - limited caffeine.    6. CKD IV - followed by Dr Theador Hawthorne         Husband with home hospice, liver cancer.  Past Medical History:  Diagnosis Date   Anemia    Anxiety    Arthritis    Bleeding from the nose Oct 2014   CAD (coronary artery disease)    a. s/p rotational atherectomy and DES placement to LAD in 2012 b. low-risk NST in 08/2018   Cataract of both eyes    Chronic kidney disease    Colon polyps    Hyperlipidemia    Hypertension    x 5-6 yrs.   LBBB (left bundle [name] block)    Renal insufficiency      Allergies  Allergen Reactions   Macrobid [Nitrofurantoin] Nausea And Vomiting     Current Outpatient Medications  Medication Sig Dispense Refill   ALPRAZolam (XANAX) 0.5 MG tablet Take 0.5 mg by mouth 3 (three) times daily as needed.     amLODipine (NORVASC) 10 MG tablet Take 1  tablet by mouth once daily 90 tablet 0   aspirin EC 81 MG tablet Take 81 mg by mouth daily. Swallow whole.     conjugated estrogens (PREMARIN) vaginal cream Place 1 Applicatorful vaginally daily. 42.5 g 12   Cyanocobalamin 1000 MCG/ML KIT Inject as directed every 30 (thirty) days     FLAXSEED, LINSEED, PO Take 1 capsule by mouth daily.      furosemide (LASIX) 40 MG tablet Take 40 mg by mouth as needed.      isosorbide mononitrate (IMDUR) 120 MG 24 hr tablet Take 1 tablet by mouth once daily 90 tablet 3   metoprolol succinate (TOPROL-XL) 50 MG 24 hr tablet Take 1.5 tablets (75 mg total) by mouth daily. Take with or immediately following a meal. 135 tablet 3   nitroGLYCERIN (NITROSTAT) 0.4 MG SL tablet Place 1 tablet (0.4 mg total) under the tongue every 5 (five) minutes as needed for chest pain. 25 tablet 3   rosuvastatin (CRESTOR) 5 MG tablet Take 1 tablet (5 mg total) by mouth daily. 90 tablet 1   sodium bicarbonate 650 MG tablet Take 650 mg by mouth 4 (four) times daily.     valsartan (DIOVAN) 40 MG tablet Take 40 mg  by mouth daily. (Patient not taking: Reported on 01/14/2022)     Vitamin D, Ergocalciferol, (DRISDOL) 50000 units CAPS capsule TAKE 1 CAPSULE BY MOUTH ONCE A WEEK PT TO TAKE 2000 UNITS OF VITAMIN D OVER THE COUNTER DAILY AFTER THIS RX IS FINISHED  0   No current facility-administered medications for this visit.     Past Surgical History:  Procedure Laterality Date   BREAST SURGERY     masses removed while she was pregnant   CARDIAC CATHETERIZATION  06/20/2008   severe single vessel high-grade stenosis of mid LAD at bifurcation of diagonal 1 and setpal perforator 1; diffuse coronary calcification of L coronary system; 50% stenosis of mid RCA (Dr. Jackie Plum)   CATARACT EXTRACTION W/PHACO Right 10/10/2013   Procedure: CATARACT EXTRACTION PHACO AND INTRAOCULAR LENS PLACEMENT ([place]);  Surgeon: Tonny Minahil Quinlivan, MD;  Location: [facility];  Service: Ophthalmology;  Laterality: Right;  CDE:11.96    CATARACT EXTRACTION W/PHACO Left 11/18/2013   Procedure: CATARACT EXTRACTION PHACO AND INTRAOCULAR LENS PLACEMENT ([facility]);  Surgeon: Tonny Donyale Berthold, MD;  Location: [facility];  Service: Ophthalmology;  Laterality: Left;  CDE 15.96   COLONOSCOPY N/A 01/16/2013   Procedure: COLONOSCOPY;  Surgeon: Rogene Houston, MD;  [facility];  Service: Endoscopy;  Laterality: N/A;  1200   CORONARY ANGIOPLASTY WITH STENT PLACEMENT  11/08/2010   complex high-speed rotational atherectomy of calcified LAD, diffusely disease LAD system (Dr. Corky Downs)   EXCISION ORAL TUMOR Right 01/14/2014   Procedure: EXCISION OROPHARYNGEAL MASS;  Surgeon: Ascencion Dike, MD;  Location: [place];  Service: ENT;  Laterality: Right;   NM MYOCAR [place]  05/2011   lexiscan myoview; normal pattern of perfusion in all regions; post-stress EF 55%; low risk scan    PARTIAL HYSTERECTOMY     TONSILLECTOMY     TRANSTHORACIC ECHOCARDIOGRAM  10/2010   EF 45-50%, mod conc LVH, grade 1 diastolic dysfunction, mildly calcified left AV cusp; calcified MV annulus     Allergies  Allergen Reactions   Macrobid [Nitrofurantoin] Nausea And Vomiting      Family History  Problem Relation Age of Onset   Liver cancer Mother    Cancer Father    Heart Problems Brother    Coronary artery disease Sister        stent   Heart Problems Daughter        also stomach problems   COPD Daughter    Colon cancer Neg Hx      Social History [name] reports that she has never smoked. She has never used smokeless tobacco. [name] reports no history of alcohol use.   Review of Systems CONSTITUTIONAL: No weight loss, fever, chills, weakness or fatigue.  HEENT: Eyes: No visual loss, blurred vision, double vision or yellow sclerae.No hearing loss, sneezing, congestion, runny nose or sore throat.  SKIN: No rash or itching.  CARDIOVASCULAR: per hpi RESPIRATORY: No shortness of breath, cough or sputum.  GASTROINTESTINAL: No anorexia,  nausea, vomiting or diarrhea. No abdominal pain or blood.  GENITOURINARY: No burning on urination, no polyuria NEUROLOGICAL: No headache, dizziness, syncope, paralysis, ataxia, numbness or tingling in the extremities. No change in bowel or bladder control.  MUSCULOSKELETAL: No muscle, back pain, joint pain or stiffness.  LYMPHATICS: No enlarged nodes. No history of splenectomy.  PSYCHIATRIC: No history of depression or anxiety.  ENDOCRINOLOGIC: No reports of sweating, cold or heat intolerance. No polyuria or polydipsia.  Marland Kitchen   Physical Examination Today's Vitals  06/27/22 1458  BP: (!) 146/62  Pulse: 95  SpO2: 95%  Weight: 165 lb (74.8 kg)  Height: 5' 2"$  (1.575 m)   Body mass index is 30.18 kg/m.  Gen: resting comfortably, no acute distress HEENT: no scleral icterus, pupils equal round and reactive, no palptable cervical adenopathy,  CV: RRR, no m/rg, no jvd Resp: Clear to auscultation bilaterally GI: abdomen is soft, non-tender, non-distended, normal bowel sounds, no hepatosplenomegaly MSK: extremities are warm, no edema.  Skin: warm, no rash Neuro:  no focal deficits Psych: appropriate affect   Diagnostic Studies  07/2021 echo   IMPRESSIONS     1. Left ventricular ejection fraction, by estimation, is 60 to 65%. The  left ventricle has normal function. The left ventricle has no regional  wall motion abnormalities. There is moderate left ventricular hypertrophy.  Left ventricular diastolic  parameters are consistent with Grade I diastolic dysfunction (impaired  relaxation).   2. Right ventricular systolic function is low normal. The right  ventricular size is normal. There is normal pulmonary artery systolic  pressure. The estimated right ventricular systolic pressure is 0000000 mmHg.   3. Left atrial size was severely dilated.   4. A small pericardial effusion is present. The pericardial effusion is  circumferential. There is no evidence of cardiac tamponade.   5. The  mitral valve is abnormal. Trivial mitral valve regurgitation.   6. The aortic valve is tricuspid. Aortic valve regurgitation is not  visualized.   7. The inferior vena cava is normal in size with greater than 50%  respiratory variability, suggesting right atrial pressure of 3 mmHg.    Assessment and Plan   1.Fatigue/palpitations/dizziness - unclear etiology - we had increased her toprol from 33mto 732min 01/2022, unclear if related to symptoms. Lower back to 509maily - with palpitations and dizziness obtain 7 day zio patch - asked to discuss her xanax dosing with her pcp - recent labs no significant anemia  Keep March appt   JonArnoldo Lenis.D.

## 2022-06-27 NOTE — Patient Instructions (Addendum)
Medication Instructions:  Decrease Toprol to 31m daily  Continue all other medications.     Labwork: none  Testing/Procedures: Your physician has recommended that you wear a 7 day event monitor. Event monitors are medical devices that record the heart's electrical activity. Doctors most often u[place]athese monitors to diagnose arrhythmias. Arrhythmias are problems with the speed or rhythm of the heartbeat. The monitor is a small, portable device. You can wear one while you do your normal daily activities. This is usually used to diagnose what is causing palpitations/syncope (passing out).  Office will contact with results via phone, letter or mychart.     Follow-Up: Keep March as scheduled   Any Other Special Instructions Will Be Listed Below (If Applicable).   If you need a refill on your cardiac medications before your next appointment, please call your pharmacy.

## 2022-06-29 DIAGNOSIS — N184 Chronic kidney disease, stage 4 (severe): Secondary | ICD-10-CM | POA: Diagnosis not present

## 2022-06-29 DIAGNOSIS — I129 Hypertensive chronic kidney disease with stage 1 through stage 4 chronic kidney disease, or unspecified chronic kidney disease: Secondary | ICD-10-CM | POA: Diagnosis not present

## 2022-06-29 DIAGNOSIS — R296 Repeated falls: Secondary | ICD-10-CM | POA: Diagnosis not present

## 2022-06-29 DIAGNOSIS — D631 Anemia in chronic kidney disease: Secondary | ICD-10-CM | POA: Diagnosis not present

## 2022-06-29 DIAGNOSIS — Z7689 Persons encountering health services in other specified circumstances: Secondary | ICD-10-CM | POA: Diagnosis not present

## 2022-06-29 DIAGNOSIS — I5032 Chronic diastolic (congestive) heart failure: Secondary | ICD-10-CM | POA: Diagnosis not present

## 2022-06-29 DIAGNOSIS — N189 Chronic kidney disease, unspecified: Secondary | ICD-10-CM | POA: Diagnosis not present

## 2022-06-29 DIAGNOSIS — R809 Proteinuria, unspecified: Secondary | ICD-10-CM | POA: Diagnosis not present

## 2022-06-29 DIAGNOSIS — E8722 Chronic metabolic acidosis: Secondary | ICD-10-CM | POA: Diagnosis not present

## 2022-07-02 DIAGNOSIS — R002 Palpitations: Secondary | ICD-10-CM

## 2022-07-04 DIAGNOSIS — M1711 Unilateral primary osteoarthritis, right knee: Secondary | ICD-10-CM | POA: Diagnosis not present

## 2022-07-04 DIAGNOSIS — S8991XA Unspecified injury of right lower leg, initial encounter: Secondary | ICD-10-CM | POA: Diagnosis not present

## 2022-07-11 DIAGNOSIS — M25561 Pain in right knee: Secondary | ICD-10-CM | POA: Insufficient documentation

## 2022-07-18 DIAGNOSIS — E538 Deficiency of other specified B group vitamins: Secondary | ICD-10-CM | POA: Diagnosis not present

## 2022-07-21 DIAGNOSIS — R002 Palpitations: Secondary | ICD-10-CM | POA: Diagnosis not present

## 2022-08-01 ENCOUNTER — Ambulatory Visit: Payer: Medicare Other | Attending: Cardiology | Admitting: Cardiology

## 2022-08-01 ENCOUNTER — Encounter: Payer: Self-pay | Admitting: Cardiology

## 2022-08-01 VITALS — BP 134/76 | HR 87 | Ht 62.0 in | Wt 154.0 lb

## 2022-08-01 DIAGNOSIS — I2510 Atherosclerotic heart disease of native coronary artery without angina pectoris: Secondary | ICD-10-CM

## 2022-08-01 DIAGNOSIS — R42 Dizziness and giddiness: Secondary | ICD-10-CM

## 2022-08-01 DIAGNOSIS — R002 Palpitations: Secondary | ICD-10-CM | POA: Diagnosis not present

## 2022-08-01 DIAGNOSIS — R5383 Other fatigue: Secondary | ICD-10-CM | POA: Diagnosis not present

## 2022-08-01 MED ORDER — NITROGLYCERIN 0.4 MG SL SUBL: 0.4000 mg | SUBLINGUAL_TABLET | SUBLINGUAL | 3 refills | Status: AC | PRN

## 2022-08-01 NOTE — Patient Instructions (Signed)
Medication Instructions:  Your physician recommends that you continue on your current medications as directed. Please refer to the Current Medication list given to you today.   Labwork: None  Testing/Procedures: None  Follow-Up: Follow up with Dr. Branch in 6 months.   Any Other Special Instructions Will Be Listed Below (If Applicable).     If you need a refill on your cardiac medications before your next appointment, please call your pharmacy.  

## 2022-08-01 NOTE — Progress Notes (Signed)
Clinical Summary [name] is a 89 y.o.female seen today for follow up of the following medical problems.       This is a focused visit on recent symptoms of fatigue, palpitations, dizziness   1.Fatigue/palpitatons/dizziness - several months of feeling fatigue, low energy - two falls recently, from standing up from bed - Some lightheadedness/dizziness, dizziness when she works gets up in the morning. Some episodes with heart fluttering with activities - 3-4 glasses 16 oz - takes xanax 1/2 tablet at lunch, full one at night     07/2022 monitor was benign -last visit we lowered her toprol back to 50mg  daily.  - chronic fatigue and weakness.       2. CAD - history of PCI to LAD - underwent a low risk nuclear stress test on 08/29/2018, EF 60%.     07/2021 echo: LVEF 60-65%, no WMAs, grade I dd, normal, low normal RV function, severe LAE - no chest pains.    2. Chronic LBBB     3. HTN - compliant with meds - recent chagnes make by neprhology, she reports bp's got too low and went back to prior regimen.    4. Hyperlipidemia - compliant with meds   5. Palpitations - occasonial palpitatons. Occurs 1-2 times daily, just a few seconds.  - recent benign monitor   6. CKD IV - followed by Dr Theador Hawthorne - she reports considering dialysis       Husband with home hospice, liver cancer.  Past Medical History:  Diagnosis Date   Anemia    Anxiety    Arthritis    Bleeding from the nose Oct 2014   CAD (coronary artery disease)    a. s/p rotational atherectomy and DES placement to LAD in 2012 b. low-risk NST in 08/2018   Cataract of both eyes    Chronic kidney disease    Colon polyps    Hyperlipidemia    Hypertension    x 5-6 yrs.   LBBB (left bundle [name] block)    Renal insufficiency      Allergies  Allergen Reactions   Macrobid [Nitrofurantoin] Nausea And Vomiting     Current Outpatient Medications  Medication Sig Dispense Refill   ALPRAZolam (XANAX) 0.5  MG tablet Take 0.5 mg by mouth 3 (three) times daily as needed.     amLODipine (NORVASC) 10 MG tablet Take 1 tablet by mouth once daily 90 tablet 0   aspirin EC 81 MG tablet Take 81 mg by mouth daily. Swallow whole.     chlorthalidone (HYGROTON) 25 MG tablet Take by mouth.     conjugated estrogens (PREMARIN) vaginal cream Place 1 Applicatorful vaginally daily. 42.5 g 12   Cyanocobalamin 1000 MCG/ML KIT Inject as directed every 30 (thirty) days     FLAXSEED, LINSEED, PO Take 1 capsule by mouth daily.      furosemide (LASIX) 40 MG tablet Take 40 mg by mouth as needed.      isosorbide mononitrate (IMDUR) 120 MG 24 hr tablet Take 1 tablet by mouth once daily 90 tablet 3   metoprolol succinate (TOPROL-XL) 50 MG 24 hr tablet Take 1 tablet (50 mg total) by mouth daily.     nitroGLYCERIN (NITROSTAT) 0.4 MG SL tablet Place 1 tablet (0.4 mg total) under the tongue every 5 (five) minutes as needed for chest pain. 25 tablet 3   rosuvastatin (CRESTOR) 5 MG tablet Take 1 tablet (5 mg total) by mouth daily. 90 tablet 1  sodium bicarbonate 650 MG tablet Take 650 mg by mouth 4 (four) times daily.     telmisartan (MICARDIS) 20 MG tablet Take by mouth.     valsartan (DIOVAN) 40 MG tablet Take 40 mg by mouth daily.     Vitamin D, Ergocalciferol, (DRISDOL) 50000 units CAPS capsule TAKE 1 CAPSULE BY MOUTH ONCE A WEEK PT TO TAKE 2000 UNITS OF VITAMIN D OVER THE COUNTER DAILY AFTER THIS RX IS FINISHED  0   No current facility-administered medications for this visit.     Past Surgical History:  Procedure Laterality Date   BREAST SURGERY     masses removed while she was pregnant   CARDIAC CATHETERIZATION  06/20/2008   severe single vessel high-grade stenosis of mid LAD at bifurcation of diagonal 1 and setpal perforator 1; diffuse coronary calcification of L coronary system; 50% stenosis of mid RCA (Dr. Jackie Plum)   CATARACT EXTRACTION W/PHACO Right 10/10/2013   Procedure: CATARACT EXTRACTION PHACO AND INTRAOCULAR  LENS PLACEMENT ([place]);  Surgeon: Tonny Ruta Capece, MD;  Location: [facility];  Service: Ophthalmology;  Laterality: Right;  CDE:11.96   CATARACT EXTRACTION W/PHACO Left 11/18/2013   Procedure: CATARACT EXTRACTION PHACO AND INTRAOCULAR LENS PLACEMENT ([facility]);  Surgeon: Tonny Jocelyne Reinertsen, MD;  Location: [facility];  Service: Ophthalmology;  Laterality: Left;  CDE 15.96   COLONOSCOPY N/A 01/16/2013   Procedure: COLONOSCOPY;  Surgeon: Rogene Houston, MD;  [facility];  Service: Endoscopy;  Laterality: N/A;  1200   CORONARY ANGIOPLASTY WITH STENT PLACEMENT  11/08/2010   complex high-speed rotational atherectomy of calcified LAD, diffusely disease LAD system (Dr. Corky Downs)   EXCISION ORAL TUMOR Right 01/14/2014   Procedure: EXCISION OROPHARYNGEAL MASS;  Surgeon: Ascencion Dike, MD;  Location: [place];  Service: ENT;  Laterality: Right;   NM MYOCAR [place]  05/2011   lexiscan myoview; normal pattern of perfusion in all regions; post-stress EF 55%; low risk scan    PARTIAL HYSTERECTOMY     TONSILLECTOMY     TRANSTHORACIC ECHOCARDIOGRAM  10/2010   EF 45-50%, mod conc LVH, grade 1 diastolic dysfunction, mildly calcified left AV cusp; calcified MV annulus     Allergies  Allergen Reactions   Macrobid [Nitrofurantoin] Nausea And Vomiting      Family History  Problem Relation Age of Onset   Liver cancer Mother    Cancer Father    Heart Problems Brother    Coronary artery disease Sister        stent   Heart Problems Daughter        also stomach problems   COPD Daughter    Colon cancer Neg Hx      Social History [name] reports that she has never smoked. She has never used smokeless tobacco. [name] reports no history of alcohol use.   Review of Systems CONSTITUTIONAL: No weight loss, fever, chills, weakness or fatigue.  HEENT: Eyes: No visual loss, blurred vision, double vision or yellow sclerae.No hearing loss, sneezing, congestion, runny nose or sore throat.  SKIN: No rash  or itching.  CARDIOVASCULAR: per hpi RESPIRATORY: No shortness of breath, cough or sputum.  GASTROINTESTINAL: No anorexia, nausea, vomiting or diarrhea. No abdominal pain or blood.  GENITOURINARY: No burning on urination, no polyuria NEUROLOGICAL: No headache, dizziness, syncope, paralysis, ataxia, numbness or tingling in the extremities. No change in bowel or bladder control.  MUSCULOSKELETAL: No muscle, back pain, joint pain or stiffness.  LYMPHATICS: No enlarged nodes. No history  of splenectomy.  PSYCHIATRIC: No history of depression or anxiety.  ENDOCRINOLOGIC: No reports of sweating, cold or heat intolerance. No polyuria or polydipsia.  Marland Kitchen   Physical Examination Today's Vitals   08/01/22 1414  BP: 134/76  Pulse: 87  SpO2: 95%  Weight: 154 lb (69.9 kg)  Height: 5\' 2"  (1.575 m)   Body mass index is 28.17 kg/m.  Gen: resting comfortably, no acute distress HEENT: no scleral icterus, pupils equal round and reactive, no palptable cervical adenopathy,  CV: RRR, no m/rg, no jvd Resp: Clear to auscultation bilaterally GI: abdomen is soft, non-tender, non-distended, normal bowel sounds, no hepatosplenomegaly MSK: extremities are warm, no edema.  Skin: warm, no rash Neuro:  no focal deficits Psych: appropriate affect   Diagnostic Studies 07/2021 echo   IMPRESSIONS     1. Left ventricular ejection fraction, by estimation, is 60 to 65%. The  left ventricle has normal function. The left ventricle has no regional  wall motion abnormalities. There is moderate left ventricular hypertrophy.  Left ventricular diastolic  parameters are consistent with Grade I diastolic dysfunction (impaired  relaxation).   2. Right ventricular systolic function is low normal. The right  ventricular size is normal. There is normal pulmonary artery systolic  pressure. The estimated right ventricular systolic pressure is 0000000 mmHg.   3. Left atrial size was severely dilated.   4. A small pericardial  effusion is present. The pericardial effusion is  circumferential. There is no evidence of cardiac tamponade.   5. The mitral valve is abnormal. Trivial mitral valve regurgitation.   6. The aortic valve is tricuspid. Aortic valve regurgitation is not  visualized.   7. The inferior vena cava is normal in size with greater than 50%  respiratory variability, suggesting right atrial pressure of 3 mmHg.     Assessment and Plan   1.Generalized fatigue - no clear cardiac cause, no specific cardiopulmonary symptoms - benign recent monitor - no further cardiac workup  2. Palpitations - mild, recent benign montior - we recently lowered her toprol due to possible connection with fatiuge, continue lower dose  3. CAD  - no symptoms, continue to monitor.    Arnoldo Lenis, M.D.

## 2022-08-20 ENCOUNTER — Other Ambulatory Visit: Payer: Self-pay | Admitting: Cardiology

## 2022-08-23 DIAGNOSIS — E538 Deficiency of other specified B group vitamins: Secondary | ICD-10-CM | POA: Diagnosis not present

## 2022-09-01 DIAGNOSIS — N184 Chronic kidney disease, stage 4 (severe): Secondary | ICD-10-CM | POA: Diagnosis not present

## 2022-09-01 DIAGNOSIS — I5032 Chronic diastolic (congestive) heart failure: Secondary | ICD-10-CM | POA: Diagnosis not present

## 2022-09-01 DIAGNOSIS — Z7689 Persons encountering health services in other specified circumstances: Secondary | ICD-10-CM | POA: Diagnosis not present

## 2022-09-01 DIAGNOSIS — I129 Hypertensive chronic kidney disease with stage 1 through stage 4 chronic kidney disease, or unspecified chronic kidney disease: Secondary | ICD-10-CM | POA: Diagnosis not present

## 2022-09-01 DIAGNOSIS — E8722 Chronic metabolic acidosis: Secondary | ICD-10-CM | POA: Diagnosis not present

## 2022-09-06 DIAGNOSIS — I129 Hypertensive chronic kidney disease with stage 1 through stage 4 chronic kidney disease, or unspecified chronic kidney disease: Secondary | ICD-10-CM | POA: Diagnosis not present

## 2022-09-06 DIAGNOSIS — N184 Chronic kidney disease, stage 4 (severe): Secondary | ICD-10-CM | POA: Diagnosis not present

## 2022-09-06 DIAGNOSIS — I10 Essential (primary) hypertension: Secondary | ICD-10-CM | POA: Diagnosis not present

## 2022-09-19 ENCOUNTER — Ambulatory Visit (HOSPITAL_COMMUNITY)
Admission: RE | Admit: 2022-09-19 | Discharge: 2022-09-19 | Disposition: A | Discharge status: Home / Self Care | Payer: Medicare Other | Source: Ambulatory Visit | Attending: Internal Medicine | Admitting: Internal Medicine

## 2022-09-19 ENCOUNTER — Other Ambulatory Visit (HOSPITAL_COMMUNITY): Payer: Self-pay | Admitting: Internal Medicine

## 2022-09-19 DIAGNOSIS — M810 Age-related osteoporosis without current pathological fracture: Secondary | ICD-10-CM | POA: Diagnosis not present

## 2022-09-19 DIAGNOSIS — R42 Dizziness and giddiness: Secondary | ICD-10-CM | POA: Diagnosis not present

## 2022-09-19 DIAGNOSIS — N184 Chronic kidney disease, stage 4 (severe): Secondary | ICD-10-CM | POA: Diagnosis not present

## 2022-09-19 DIAGNOSIS — S0990XA Unspecified injury of head, initial encounter: Secondary | ICD-10-CM | POA: Insufficient documentation

## 2022-09-19 DIAGNOSIS — I10 Essential (primary) hypertension: Secondary | ICD-10-CM | POA: Diagnosis not present

## 2022-09-19 DIAGNOSIS — I129 Hypertensive chronic kidney disease with stage 1 through stage 4 chronic kidney disease, or unspecified chronic kidney disease: Secondary | ICD-10-CM | POA: Diagnosis not present

## 2022-09-22 ENCOUNTER — Encounter: Payer: Self-pay | Admitting: Obstetrics & Gynecology

## 2022-09-22 ENCOUNTER — Ambulatory Visit: Payer: Medicare Other | Admitting: Obstetrics & Gynecology

## 2022-09-22 VITALS — BP 128/65 | HR 84

## 2022-09-22 DIAGNOSIS — N764 Abscess of vulva: Secondary | ICD-10-CM

## 2022-09-22 DIAGNOSIS — E538 Deficiency of other specified B group vitamins: Secondary | ICD-10-CM | POA: Diagnosis not present

## 2022-09-22 DIAGNOSIS — L02214 Cutaneous abscess of groin: Secondary | ICD-10-CM | POA: Diagnosis not present

## 2022-09-22 MED ORDER — SULFAMETHOXAZOLE-TRIMETHOPRIM 800-160 MG PO TABS
1.0000 | ORAL_TABLET | Freq: Two times a day (BID) | ORAL | 0 refills | Status: AC
Start: 2022-09-22 — End: 2022-10-02

## 2022-09-22 NOTE — Progress Notes (Signed)
   GYN VISIT Patient name: [name] MRN [id number]  Date of birth: 05-31-1933 Chief Complaint:   Procedure (I&D of boil)  History of Present Illness:   [name] is a 89 y.o. G4P4 referral for consultation from PCP due to vulvar abscess.    This has been present since Saturday.  Trying to use OTC black salve, but it doesn't seem to have helped.  Reports that she tried to drain it herself but was unsuccessful.  Notes considerable pain.  Taking OTC medication for the pain.  Notes fever all day Sunday.    -Seen by PCP and treated with Rocephin IM x 2.     Noted diarrhea this weekend but no other acute concerns or complaints  No LMP recorded. Patient has had a hysterectomy.      No data to display           Review of Systems:   Pertinent items are noted in HPI Denies fever/chills, dizziness, headaches, visual disturbances, fatigue, shortness of breath, chest pain, abdominal pain, vomiting. Pertinent History Reviewed:  Reviewed past medical,surgical, social, obstetrical and family history.  Reviewed problem list, medications and allergies. Physical Assessment:   Vitals:   09/22/22 1421  BP: 128/65  Pulse: 84  There is no height or weight on file to calculate BMI.       Physical Examination:   General appearance: alert, well appearing, and in no distress  Psych: mood appropriate, normal affect  Skin: warm & dry   Cardiovascular: normal heart rate noted  Respiratory: normal respiratory effort, no distress  Abdomen: soft, non-tender, no rebound no guarding  Pelvic: Lower portion of right labia majora: significant inducation and erythema, small pea-sized white area. Abscess measures ~ 7x6x5cm  Extremities: no edema   I&D NOTE The indications for I&D  were reviewed - abscess.   Risks include pain, bleeding, infection and scarring.  The patient stated understanding and agreed to undergo procedure today.   The patient's skin over the abscess was prepped with Betadine.   1% lidocaine was injected into the abscess and surrounding area.  A 3cm incision was made with an 11 blade scalpel.  Serosanguinous fluid was drained though due to induration/loculation, only moderate drainage noted. Culture was collected. Q-tip and sterile water was used to irrigate and break up loculations.  Significant improvement of erythema was noted.  Pt tolerated procedure without difficulty  Chaperone: Faith Rogue    Assessment & Plan:  1) Vulvar abscess -I&D completed as above -Bactrim DS sent in, due to due to extensive cellulitis plan for 10-day course Post-procedure instructions were given -Follow-up in 1 week  Meds ordered this encounter  Medications   sulfamethoxazole-trimethoprim (BACTRIM DS) 800-160 MG tablet    Sig: Take 1 tablet by mouth 2 (two) times daily for 10 days.    Dispense:  20 tablet    Refill:  0     Orders Placed This Encounter  Procedures   Wound culture    Return in about 1 week (around 09/29/2022) for I&D follow up with Janice Seales.   Myna Hidalgo, DO Attending Obstetrician & Gynecologist, [facility] for [employer], [facility]

## 2022-09-25 LAB — WOUND CULTURE: Organism ID, Bacteria: NONE SEEN

## 2022-09-28 ENCOUNTER — Ambulatory Visit: Payer: Medicare Other | Admitting: Obstetrics & Gynecology

## 2022-09-28 ENCOUNTER — Encounter: Payer: Self-pay | Admitting: Obstetrics & Gynecology

## 2022-09-28 VITALS — BP 140/78 | HR 84

## 2022-09-28 DIAGNOSIS — N764 Abscess of vulva: Secondary | ICD-10-CM

## 2022-09-28 LAB — WOUND CULTURE

## 2022-09-28 NOTE — Progress Notes (Signed)
GYN VISIT Patient name: [name] MRN [id number]  Date of birth: [birth year] Chief Complaint:   Follow-up  History of Present Illness:   [name] is a 89 y.o. G4P4 PM, PH female being seen today for follow-up for vulvar abscess.  Status post I&D completed on 09/22/2022.  Today she notes that she is doing much better.  It continues to decrease in size, denies significant pain or discomfort.  Denies drainage from the area.  Denies fevers or chills.  No LMP recorded. Patient has had a hysterectomy.    Review of Systems:   Pertinent items are noted in HPI Denies fever/chills, dizziness, headaches, visual disturbances, fatigue, shortness of breath, chest pain, abdominal pain, vomiting. Pertinent History Reviewed:   Past Surgical History:  Procedure Laterality Date   BREAST SURGERY     masses removed while she was pregnant   CARDIAC CATHETERIZATION  06/20/2008   severe single vessel high-grade stenosis of mid LAD at bifurcation of diagonal 1 and setpal perforator 1; diffuse coronary calcification of L coronary system; 50% stenosis of mid RCA (Dr. Evlyn Courier)   CATARACT EXTRACTION W/PHACO Right 10/10/2013   Procedure: CATARACT EXTRACTION PHACO AND INTRAOCULAR LENS PLACEMENT (IOC);  Surgeon: Gemma Payor, MD;  Location: [facility];  Service: Ophthalmology;  Laterality: Right;  CDE:11.96   CATARACT EXTRACTION W/PHACO Left 11/18/2013   Procedure: CATARACT EXTRACTION PHACO AND INTRAOCULAR LENS PLACEMENT (IOC);  Surgeon: Gemma Payor, MD;  Location: [facility];  Service: Ophthalmology;  Laterality: Left;  CDE 15.96   COLONOSCOPY N/A 01/16/2013   Procedure: COLONOSCOPY;  Surgeon: Malissa Hippo, MD;  [facility];  Service: Endoscopy;  Laterality: N/A;  1200   CORONARY ANGIOPLASTY WITH STENT PLACEMENT  11/08/2010   complex high-speed rotational atherectomy of calcified LAD, diffusely disease LAD system (Dr. Bishop Limbo)   EXCISION ORAL TUMOR Right 01/14/2014   Procedure: EXCISION OROPHARYNGEAL MASS;   Surgeon: Darletta Moll, MD;  Location: [facility];  Service: ENT;  Laterality: Right;   NM MYOCAR PERF WALL MOTION  05/2011   lexiscan myoview; normal pattern of perfusion in all regions; post-stress EF 55%; low risk scan    PARTIAL HYSTERECTOMY     TONSILLECTOMY     TRANSTHORACIC ECHOCARDIOGRAM  10/2010   EF 45-50%, mod conc LVH, grade 1 diastolic dysfunction, mildly calcified left AV cusp; calcified MV annulus    Past Medical History:  Diagnosis Date   Anemia    Anxiety    Arthritis    Bleeding from the nose Oct 2014   CAD (coronary artery disease)    a. s/p rotational atherectomy and DES placement to LAD in 2012 b. low-risk NST in 08/2018   Cataract of both eyes    Chronic kidney disease    Colon polyps    Hyperlipidemia    Hypertension    x 5-6 yrs.   LBBB (left bundle branch block)    Renal insufficiency    Reviewed problem list, medications and allergies. Physical Assessment:   Vitals:   09/28/22 1332  BP: (!) 140/78  Pulse: 84  There is no height or weight on file to calculate BMI.       Physical Examination:   General appearance: alert, well appearing, and in no distress  Psych: mood appropriate, normal affect  Skin: warm & dry   Cardiovascular: normal heart rate noted  Respiratory: normal respiratory effort, no distress  Abdomen: soft, non-tender   Pelvic: VULVA: Right labia: Incision well-healed appears  clean dry intact.  Induration still noted approximately 3 x 3 x 3 cm.  Erythema significantly improved.  No other abnormalities noted  Extremities: no edema, no calf tenderness bilaterally  Chaperone:  Patient declined     Assessment & Plan:  1) vulvar abscess -Reviewed vulvar hygiene -Again discussed importance of completing complete course of antibiotics -Reviewed abscess precautions -Plan to follow-up in 4 weeks if needed   Return in about 4 weeks (around 10/26/2022) for follow up with Dr. Charlotta Newton.   Myna Hidalgo, DO Attending  Obstetrician & Gynecologist, [facility] for [facility], [facility]   9

## 2022-10-06 ENCOUNTER — Telehealth: Payer: Self-pay | Admitting: Obstetrics & Gynecology

## 2022-10-06 ENCOUNTER — Encounter: Payer: Self-pay | Admitting: Obstetrics & Gynecology

## 2022-10-06 ENCOUNTER — Ambulatory Visit: Payer: Medicare Other | Admitting: Obstetrics & Gynecology

## 2022-10-06 VITALS — BP 160/59 | HR 80 | Wt 153.2 lb

## 2022-10-06 DIAGNOSIS — R4589 Other symptoms and signs involving emotional state: Secondary | ICD-10-CM | POA: Diagnosis not present

## 2022-10-06 DIAGNOSIS — N764 Abscess of vulva: Secondary | ICD-10-CM

## 2022-10-06 NOTE — Telephone Encounter (Signed)
Pt states she has a long knot on her vagina and is very painful. Please contact pt via telephone.

## 2022-10-06 NOTE — Telephone Encounter (Signed)
Attempted to call patient. Call went to voicemail. Informed front desk that if she calls back to see if she can come see Dr. Charlotta Newton at 1:30 today.

## 2022-10-06 NOTE — Progress Notes (Signed)
GYN VISIT Patient name: [name] MRN [id number]  Date of birth: April 15, 1934 Chief Complaint:   Follow-up  History of Present Illness:   [name] is a 89 y.o. G96P4 PM female being seen today for vuvlar abscess follow up.  Pt was last seen 5/22.  She reports that as soon as she stopped her antibiotic it seemed to get worse.  She states that when she touches the area she still feels like it is there and extends down to her bottom.  She denies fever or chills.  Denies drainage.  Reports no other acute complaints or concerns  No LMP recorded. Patient has had a hysterectomy.    Review of Systems:   Pertinent items are noted in HPI Denies fever/chills, dizziness, headaches, visual disturbances, fatigue, shortness of breath, chest pain, abdominal pain, vomiting. Pertinent History Reviewed:   Past Surgical History:  Procedure Laterality Date   BREAST SURGERY     masses removed while she was pregnant   CARDIAC CATHETERIZATION  06/20/2008   severe single vessel high-grade stenosis of mid LAD at bifurcation of diagonal 1 and setpal perforator 1; diffuse coronary calcification of L coronary system; 50% stenosis of mid RCA (Dr. Evlyn Courier)   CATARACT EXTRACTION W/PHACO Right 10/10/2013   Procedure: CATARACT EXTRACTION PHACO AND INTRAOCULAR LENS PLACEMENT (IOC);  Surgeon: Gemma Payor, MD;  [facility]: [facility];  Service: Ophthalmology;  Laterality: Right;  CDE:11.96   CATARACT EXTRACTION W/PHACO Left 11/18/2013   Procedure: CATARACT EXTRACTION PHACO AND INTRAOCULAR LENS PLACEMENT (IOC);  Surgeon: Gemma Payor, MD;  [facility]: [facility];  Service: Ophthalmology;  Laterality: Left;  CDE 15.96   COLONOSCOPY N/A 01/16/2013   Procedure: COLONOSCOPY;  Surgeon: Malissa Hippo, MD;  [facility];  Service: Endoscopy;  Laterality: N/A;  1200   CORONARY ANGIOPLASTY WITH STENT PLACEMENT  11/08/2010   complex high-speed rotational atherectomy of calcified LAD, diffusely disease LAD system (Dr. Bishop Limbo)    EXCISION ORAL TUMOR Right 01/14/2014   Procedure: EXCISION OROPHARYNGEAL MASS;  Surgeon: Darletta Moll, MD;  Location: [facility];  Service: ENT;  Laterality: Right;   NM MYOCAR PERF WALL MOTION  05/2011   lexiscan myoview; normal pattern of perfusion in all regions; post-stress EF 55%; low risk scan    PARTIAL HYSTERECTOMY     TONSILLECTOMY     TRANSTHORACIC ECHOCARDIOGRAM  10/2010   EF 45-50%, mod conc LVH, grade 1 diastolic dysfunction, mildly calcified left AV cusp; calcified MV annulus    Past Medical History:  Diagnosis Date   Anemia    Anxiety    Arthritis    Bleeding from the nose Oct 2014   CAD (coronary artery disease)    a. s/p rotational atherectomy and DES placement to LAD in 2012 b. low-risk NST in 08/2018   Cataract of both eyes    Chronic kidney disease    Colon polyps    Hyperlipidemia    Hypertension    x 5-6 yrs.   LBBB (left bundle branch block)    Renal insufficiency    Reviewed problem list, medications and allergies. Physical Assessment:   Vitals:   10/06/22 1332  BP: (!) 160/59  Pulse: 80  Weight: 153 lb 3.2 oz (69.5 kg)  Body mass index is 28.02 kg/m.       Physical Examination:   General appearance: alert, well appearing, and in no distress  Psych: mood appropriate, normal affect  Skin: warm & dry   Cardiovascular: normal  heart rate noted  Respiratory: normal respiratory effort, no distress  Abdomen: soft, non-tender   Pelvic: see picture- deep within right labia majora ~ 2x3x2 area of induration noted.  No erythema, minimal tenderness  Extremities: no edema   Chaperone: Faith Rogue    Assessment & Plan:  1) Vulvar abscess -Decreased in size from prior exam -Reassured patient-no evidence of infection and that induration will slowly heal over time -Reviewed abscess precautions -Follow-up as scheduled June 21   No orders of the defined types were placed in this encounter.   Return for as scheduled June 21.   Myna Hidalgo, DO Attending Obstetrician & Gynecologist, [facility] for [employer], [facility]

## 2022-10-25 DIAGNOSIS — E538 Deficiency of other specified B group vitamins: Secondary | ICD-10-CM | POA: Diagnosis not present

## 2022-10-26 ENCOUNTER — Ambulatory Visit: Payer: Medicare Other | Admitting: Obstetrics & Gynecology

## 2022-10-27 DIAGNOSIS — N184 Chronic kidney disease, stage 4 (severe): Secondary | ICD-10-CM | POA: Diagnosis not present

## 2022-10-27 DIAGNOSIS — R300 Dysuria: Secondary | ICD-10-CM | POA: Diagnosis not present

## 2022-10-31 DIAGNOSIS — N184 Chronic kidney disease, stage 4 (severe): Secondary | ICD-10-CM | POA: Diagnosis not present

## 2022-10-31 DIAGNOSIS — I129 Hypertensive chronic kidney disease with stage 1 through stage 4 chronic kidney disease, or unspecified chronic kidney disease: Secondary | ICD-10-CM | POA: Diagnosis not present

## 2022-10-31 DIAGNOSIS — R809 Proteinuria, unspecified: Secondary | ICD-10-CM | POA: Diagnosis not present

## 2022-10-31 DIAGNOSIS — N2581 Secondary hyperparathyroidism of renal origin: Secondary | ICD-10-CM | POA: Diagnosis not present

## 2022-11-14 ENCOUNTER — Other Ambulatory Visit: Payer: Self-pay | Admitting: Cardiology

## 2022-11-14 DIAGNOSIS — M5451 Vertebrogenic low back pain: Secondary | ICD-10-CM | POA: Diagnosis not present

## 2022-11-14 DIAGNOSIS — M25561 Pain in right knee: Secondary | ICD-10-CM | POA: Diagnosis not present

## 2022-11-14 DIAGNOSIS — M7918 Myalgia, other site: Secondary | ICD-10-CM | POA: Diagnosis not present

## 2022-11-21 DIAGNOSIS — N189 Chronic kidney disease, unspecified: Secondary | ICD-10-CM | POA: Diagnosis not present

## 2022-11-21 DIAGNOSIS — R809 Proteinuria, unspecified: Secondary | ICD-10-CM | POA: Diagnosis not present

## 2022-11-21 DIAGNOSIS — N2581 Secondary hyperparathyroidism of renal origin: Secondary | ICD-10-CM | POA: Diagnosis not present

## 2022-11-21 DIAGNOSIS — I129 Hypertensive chronic kidney disease with stage 1 through stage 4 chronic kidney disease, or unspecified chronic kidney disease: Secondary | ICD-10-CM | POA: Diagnosis not present

## 2022-11-21 DIAGNOSIS — N184 Chronic kidney disease, stage 4 (severe): Secondary | ICD-10-CM | POA: Diagnosis not present

## 2022-11-23 DIAGNOSIS — E538 Deficiency of other specified B group vitamins: Secondary | ICD-10-CM | POA: Diagnosis not present

## 2022-11-28 DIAGNOSIS — N184 Chronic kidney disease, stage 4 (severe): Secondary | ICD-10-CM | POA: Diagnosis not present

## 2022-11-28 DIAGNOSIS — N2581 Secondary hyperparathyroidism of renal origin: Secondary | ICD-10-CM | POA: Diagnosis not present

## 2022-11-28 DIAGNOSIS — R809 Proteinuria, unspecified: Secondary | ICD-10-CM | POA: Diagnosis not present

## 2022-11-28 DIAGNOSIS — I129 Hypertensive chronic kidney disease with stage 1 through stage 4 chronic kidney disease, or unspecified chronic kidney disease: Secondary | ICD-10-CM | POA: Diagnosis not present

## 2022-12-05 DIAGNOSIS — M5451 Vertebrogenic low back pain: Secondary | ICD-10-CM | POA: Diagnosis not present

## 2022-12-05 DIAGNOSIS — M7918 Myalgia, other site: Secondary | ICD-10-CM | POA: Diagnosis not present

## 2022-12-12 ENCOUNTER — Ambulatory Visit: Payer: Medicare Other | Admitting: Obstetrics & Gynecology

## 2022-12-12 ENCOUNTER — Encounter: Payer: Self-pay | Admitting: Obstetrics & Gynecology

## 2022-12-12 VITALS — BP 151/67 | HR 92

## 2022-12-12 DIAGNOSIS — R197 Diarrhea, unspecified: Secondary | ICD-10-CM

## 2022-12-12 NOTE — Progress Notes (Signed)
GYN VISIT Patient name: [name] MRN [id number]  Date of birth: 21-Oct-1933 Chief Complaint:   Follow-up  History of Present Illness:   [name] is a 89 y.o. G4P4 PM, PH female being seen today for the following concerns:  -Diarrhea: This has been an ongoing issue for the past several weeks.  Typically 5-6x per day, notes soft dark liquid stool.  She has also noted some fecal incontinence due to the diarrhea.  She does note that she has been able to stay hydrated.  Some nausea, no vomiting.  Noted blood in her stool x1 and per pt likely due to hemorrhoids. Denies further episodes of blood in her stool.  Patient called PCP and was advised to go to the ER.  She does not feel like the issue is "urgent.  But wishes to have it addressed  Of note I last saw patient for a vulvar abscess-where she was treated with antibiotics and I&D (May 2024)    No LMP recorded. Patient has had a hysterectomy.    Review of Systems:   Pertinent items are noted in HPI Denies fever/chills, dizziness, headaches, visual disturbances, fatigue, shortness of breath, chest pain.  Pertinent History Reviewed:   Past Surgical History:  Procedure Laterality Date   BREAST SURGERY     masses removed while she was pregnant   CARDIAC CATHETERIZATION  06/20/2008   severe single vessel high-grade stenosis of mid LAD at bifurcation of diagonal 1 and setpal perforator 1; diffuse coronary calcification of L coronary system; 50% stenosis of mid RCA (Dr. Evlyn Courier)   CATARACT EXTRACTION W/PHACO Right 10/10/2013   Procedure: CATARACT EXTRACTION PHACO AND INTRAOCULAR LENS PLACEMENT (IOC);  Surgeon: Gemma Payor, MD;  Location: [facility];  Service: Ophthalmology;  Laterality: Right;  CDE:11.96   CATARACT EXTRACTION W/PHACO Left 11/18/2013   Procedure: CATARACT EXTRACTION PHACO AND INTRAOCULAR LENS PLACEMENT (IOC);  Surgeon: Gemma Payor, MD;  Location: [facility];  Service: Ophthalmology;  Laterality: Left;  CDE 15.96   COLONOSCOPY  N/A 01/16/2013   Procedure: COLONOSCOPY;  Surgeon: Malissa Hippo, MD;  [facility];  Service: Endoscopy;  Laterality: N/A;  1200   CORONARY ANGIOPLASTY WITH STENT PLACEMENT  11/08/2010   complex high-speed rotational atherectomy of calcified LAD, diffusely disease LAD system (Dr. Bishop Limbo)   EXCISION ORAL TUMOR Right 01/14/2014   Procedure: EXCISION OROPHARYNGEAL MASS;  Surgeon: Darletta Moll, MD;  Location: [facility];  Service: ENT;  Laterality: Right;   NM MYOCAR PERF WALL MOTION  05/2011   lexiscan myoview; normal pattern of perfusion in all regions; post-stress EF 55%; low risk scan    PARTIAL HYSTERECTOMY     TONSILLECTOMY     TRANSTHORACIC ECHOCARDIOGRAM  10/2010   EF 45-50%, mod conc LVH, grade 1 diastolic dysfunction, mildly calcified left AV cusp; calcified MV annulus    Past Medical History:  Diagnosis Date   Anemia    Anxiety    Arthritis    Bleeding from the nose Oct 2014   CAD (coronary artery disease)    a. s/p rotational atherectomy and DES placement to LAD in 2012 b. low-risk NST in 08/2018   Cataract of both eyes    Chronic kidney disease    Colon polyps    Hyperlipidemia    Hypertension    x 5-6 yrs.   LBBB (left bundle branch block)    Renal insufficiency    Reviewed problem list, medications and allergies. Physical Assessment:  Vitals:   12/12/22 1107  BP: (!) 151/67  Pulse: 92  There is no height or weight on file to calculate BMI.       Physical Examination:   General appearance: alert, well appearing, and in no distress  Psych: mood appropriate, normal affect  Skin: warm & dry   Cardiovascular: normal heart rate noted  Respiratory: normal respiratory effort, no distress  Abdomen: soft, non-tender, no rebound, no guarding  Pelvic: examination not indicated  Extremities: no edema   Chaperone: N/A    Assessment & Plan:  1) Diarrhea/Fecal incontinence -C.diff testing ordered  -if negative, will set up referral to  GI -should issue worsen prior to results- advise urgent care setting -advised stopping mylanta, may also try metamucil   Orders Placed This Encounter  Procedures   Clostridium Difficile by PCR([facility])    Return if symptoms worsen or fail to improve.   Myna Hidalgo, DO Attending Obstetrician & Gynecologist, [facility] for [employer], [facility]

## 2022-12-13 DIAGNOSIS — R197 Diarrhea, unspecified: Secondary | ICD-10-CM | POA: Diagnosis not present

## 2022-12-15 ENCOUNTER — Other Ambulatory Visit: Payer: Self-pay | Admitting: Obstetrics & Gynecology

## 2022-12-15 DIAGNOSIS — R197 Diarrhea, unspecified: Secondary | ICD-10-CM

## 2022-12-15 NOTE — Progress Notes (Signed)
Referral to GI due to persistent diarrhea

## 2022-12-19 NOTE — Progress Notes (Signed)
Referring Provider: Myna Hidalgo, DO  Primary Care Physician:  Assunta Found, MD Primary Gastroenterologist:  Dr. Karilyn Cota previously. Establishing with Dr. Tasia Catchings  Chief Complaint  Patient presents with   Diarrhea    Had diarrhea for about 3 weeks. Sometimes didn't know she was going. It seems to be getting better in the last couple of days.     HPI:   [name] is a 89 y.o. female presenting today at the request of Myna Hidalgo, DO for diarrhea.   Reviewed office visit with Dr. Charlotta Newton 12/12/2022.  Patient reported diarrhea for the past several weeks with 5 or 6 bowel movements per day, described as soft, dark liquid stool.  Also with some fecal incontinence, some nausea, but no vomiting.  Single episode of blood in her stool likely secondary to hemorrhoids per patient.  She was taking Mylanta.  She was advised to stop Mylanta, try Metamucil, and complete C. difficile testing.  C. difficile PCR was negative on 12/13/2022.   Today: Reports having trouble remembering the exact timeline of everything.  She has a lot going on, caring for her husband who has cancer.  States she had staph infection/abscess on labia. Saw OBGYN and was treated with Bactrim. Diarrhea started after she was found to have the abscess, but before she started Bactium  which was 5/16.  After taking Bactrim, she reports her diarrhea actually improved.  When she was having diarrhea, she was having loose/watery stools with incontinence.  Stools are now soft and becoming more formed.  No runny stool. No blood in the stool. When diarrhea first started she had a small amount of blood when he hemorrhoid became prolapsed. Also had some dark stools for about 3 weeks when diarrhea started, but not pitch black. This has resolved. Had lower abdominal pressure with bowel movements as well, but this significantly improved. Still having 5-6 Bms per day but they are very small in amount.    Before diarrhea started, she would have 1  normal BM every morning.   New medications: Telmisartan started this year but has stopped it and thinks this also helped with diarrhea. States nephrology told her this could have contributed to diarrhea as well.   No iron. She did take some pepto while having diarrhea.   She has chronic anemia. No iron or B12 deficiency in April 2024.   Colonoscopy on 01/16/2013 revealing sigmoid colon diverticulosis as well as changes of diverticulitis involving one diverticulum. She also had external hemorrhoids.   Past Medical History:  Diagnosis Date   Anemia    Anxiety    Arthritis    Bleeding from the nose Oct 2014   CAD (coronary artery disease)    a. s/p rotational atherectomy and DES placement to LAD in 2012 b. low-risk NST in 08/2018   Cataract of both eyes    Chronic kidney disease    Colon polyps    Hyperlipidemia    Hypertension    x 5-6 yrs.   LBBB (left bundle branch block)    Renal insufficiency     Past Surgical History:  Procedure Laterality Date   BREAST SURGERY     masses removed while she was pregnant   CARDIAC CATHETERIZATION  06/20/2008   severe single vessel high-grade stenosis of mid LAD at bifurcation of diagonal 1 and setpal perforator 1; diffuse coronary calcification of L coronary system; 50% stenosis of mid RCA (Dr. Evlyn Courier)   CATARACT EXTRACTION W/PHACO Right 10/10/2013   Procedure: CATARACT  EXTRACTION PHACO AND INTRAOCULAR LENS PLACEMENT (IOC);  Surgeon: Gemma Payor, MD;  Location: [facility];  Service: Ophthalmology;  Laterality: Right;  CDE:11.96   CATARACT EXTRACTION W/PHACO Left 11/18/2013   Procedure: CATARACT EXTRACTION PHACO AND INTRAOCULAR LENS PLACEMENT (IOC);  Surgeon: Gemma Payor, MD;  Location: [facility];  Service: Ophthalmology;  Laterality: Left;  CDE 15.96   COLONOSCOPY N/A 01/16/2013   Procedure: COLONOSCOPY;  Surgeon: Malissa Hippo, MD; igmoid colon diverticulosis as well as changes of diverticulitis involving one diverticulum. She also had external  hemorrhoids.   CORONARY ANGIOPLASTY WITH STENT PLACEMENT  11/08/2010   complex high-speed rotational atherectomy of calcified LAD, diffusely disease LAD system (Dr. Bishop Limbo)   EXCISION ORAL TUMOR Right 01/14/2014   Procedure: EXCISION OROPHARYNGEAL MASS;  Surgeon: Darletta Moll, MD;  Location: [facility];  Service: ENT;  Laterality: Right;   NM MYOCAR PERF WALL MOTION  05/10/2011   lexiscan myoview; normal pattern of perfusion in all regions; post-stress EF 55%; low risk scan    PARTIAL HYSTERECTOMY     TONSILLECTOMY     TRANSTHORACIC ECHOCARDIOGRAM  10/08/2010   EF 45-50%, mod conc LVH, grade 1 diastolic dysfunction, mildly calcified left AV cusp; calcified MV annulus    Current Outpatient Medications  Medication Sig Dispense Refill   ALPRAZolam (XANAX) 0.5 MG tablet Take 0.5 mg by mouth 3 (three) times daily as needed.     amLODipine (NORVASC) 10 MG tablet Take 1 tablet by mouth once daily 90 tablet 0   aspirin EC 81 MG tablet Take 81 mg by mouth daily. Swallow whole.     Cyanocobalamin 1000 MCG/ML KIT      FLAXSEED, LINSEED, PO Take 1 capsule by mouth daily.      furosemide (LASIX) 40 MG tablet Take 40 mg by mouth as needed.      isosorbide mononitrate (IMDUR) 120 MG 24 hr tablet Take 1 tablet by mouth once daily 90 tablet 2   nitroGLYCERIN (NITROSTAT) 0.4 MG SL tablet Place 1 tablet (0.4 mg total) under the tongue every 5 (five) minutes as needed for chest pain. 25 tablet 3   predniSONE (DELTASONE) 10 MG tablet Take by mouth.     rosuvastatin (CRESTOR) 5 MG tablet Take 1 tablet (5 mg total) by mouth daily. 90 tablet 1   sodium bicarbonate 650 MG tablet Take 650 mg by mouth 4 (four) times daily.     metoprolol succinate (TOPROL-XL) 50 MG 24 hr tablet Take 1 tablet (50 mg total) by mouth daily. (Patient not taking: Reported on 12/21/2022)     Vitamin D, Ergocalciferol, (DRISDOL) 50000 units CAPS capsule TAKE 1 CAPSULE BY MOUTH ONCE A WEEK PT TO TAKE 2000 UNITS OF VITAMIN D  OVER THE COUNTER DAILY AFTER THIS RX IS FINISHED (Patient not taking: Reported on 09/22/2022)  0   No current facility-administered medications for this visit.    Allergies as of 12/21/2022 - Review Complete 12/21/2022  Allergen Reaction Noted   Macrobid [nitrofurantoin] Nausea And Vomiting 05/08/2019    Family History  Problem Relation Age of Onset   Liver cancer Mother    Cancer Father    Heart Problems Brother    Coronary artery disease Sister        stent   Heart Problems Daughter        also stomach problems   COPD Daughter    Colon cancer Neg Hx     Social History   Socioeconomic History   Marital status:  Married    Spouse name: Not on file   Number of children: 4   Years of education: 8   Highest education level: Not on file  Occupational History   Not on file  Tobacco Use   Smoking status: Never   Smokeless tobacco: Never  Vaping Use   Vaping status: Never Used  Substance and Sexual Activity   Alcohol use: No   Drug use: No   Sexual activity: Yes    Birth control/protection: Surgical  Other Topics Concern   Not on file  Social History Narrative   Not on file   Social Determinants of Health   Financial Resource Strain: Not on file  Food Insecurity: Not on file  Transportation Needs: Not on file  Physical Activity: Not on file  Stress: Not on file  Social Connections: Not on file  Intimate Partner Violence: Not on file    Review of Systems: Gen: Denies any fever, chills, cold or flulike symptoms, presyncope, syncope. CV: Denies chest pain, heart palpitations. Resp: Denies shortness of breath, cough. GI: See HPI GU : Denies urinary burning, urinary frequency, urinary hesitancy MS: Denies joint pain. Derm: Denies rash. Psych: Denies depression, anxiety. Heme: See HPI  Physical Exam: BP 134/69 (BP Location: Right Arm, Patient Position: Sitting, Cuff Size: Normal)   Pulse 69   Temp 97.9 F (36.6 C) (Temporal)   Ht 5\' 2"  (1.575 m)   Wt 151  lb (68.5 kg)   SpO2 96%   BMI 27.62 kg/m  General:   Alert and oriented. Pleasant and cooperative. Well-nourished and well-developed.  Head:  Normocephalic and atraumatic. Eyes:  Without icterus, sclera clear and conjunctiva pink.  Ears:  Normal auditory acuity. Lungs:  Clear to auscultation bilaterally. No wheezes, rales, or rhonchi. No distress.  Heart:  S1, S2 present without murmurs appreciated.  Abdomen:  +BS, soft, non-tender and non-distended. No HSM noted. No guarding or rebound. No masses appreciated.  Rectal:  Deferred  Msk:  Symmetrical without gross deformities. Normal posture. Extremities:  Without edema. Neurologic:  Alert and  oriented x4;  grossly normal neurologically. Skin:  Intact without significant lesions or rashes. Psych:  Normal mood and affect.    Assessment:  89 year old female with history of CAD, CKD, HTN, HLD, chronic anemia, anxiety, arthritis, presenting today at the request of Myna Hidalgo, DO for further evaluation of diarrhea.  Diarrhea: Acute change in bowel habits from 1 formed stool daily to loose/watery stools with incontinence a few months ago.  Patient reports diarrhea started to improve after taking a course of Bactrim for an abscess on her labia.  She also reports discontinuing telmisartan which was the only new medication that she started earlier this year with further improvement in her diarrhea.  She did have some associated lower abdominal pain when symptoms started, but this has significantly improved. Also with a small amount of toilet tissue hematochezia when a hemorrhoid became prolapsed and dark stools initially in the setting of Pepto-Bismol, but all of these symptoms have resolved.  She continues to have about 5 or 6 BMs per day, but states they are very small and soft/becoming formed.  No BRBPR, melena, abdominal pain, unintentional weight loss.  C. difficile PCR - 12/13/2022.  It is possible patient experienced acute gastroenteritis  followed by post infectious IBS. Patient would like to continue to monitor for now as symptoms are improving. Recommended adding fiber, daily probiotic.   Anemia:  Chronic.  Baseline hemoglobin in the 10-11 range.  No  iron or B12 deficiency in April 2024. She is not on oral iron. In light of recent low-volume rectal bleeding, in the setting of hemorrhoids, and reported dark stools, though in the setting of Pepto-Bismol, will repeat CBC and iron panel.    Plan:  CBC, iron panel Start Benefiber 3 teaspoons daily x 2 weeks, then increase to twice daily. Start daily probiotic. Avoid dairy products for now. Follow-up in 3 months.  Requested patient to let me know if she has recurrent diarrhea.   Ermalinda Memos, PA-C [facility] 12/21/2022

## 2022-12-21 ENCOUNTER — Ambulatory Visit: Payer: Medicare Other | Admitting: Gastroenterology

## 2022-12-21 ENCOUNTER — Encounter: Payer: Self-pay | Admitting: Obstetrics

## 2022-12-21 ENCOUNTER — Encounter: Payer: Self-pay | Admitting: Gastroenterology

## 2022-12-21 VITALS — BP 134/69 | HR 69 | Temp 97.9°F | Ht 62.0 in | Wt 151.0 lb

## 2022-12-21 DIAGNOSIS — K625 Hemorrhage of anus and rectum: Secondary | ICD-10-CM | POA: Diagnosis not present

## 2022-12-21 DIAGNOSIS — K921 Melena: Secondary | ICD-10-CM | POA: Diagnosis not present

## 2022-12-21 DIAGNOSIS — R197 Diarrhea, unspecified: Secondary | ICD-10-CM | POA: Diagnosis not present

## 2022-12-21 DIAGNOSIS — E538 Deficiency of other specified B group vitamins: Secondary | ICD-10-CM | POA: Diagnosis not present

## 2022-12-21 NOTE — Patient Instructions (Addendum)
Please have labs completed at [facility].   Start Benefiber 3 teaspoons daily x 2 weeks then increase to twice daily.   Start daily probiotic such as align, digestive advantage, Vear Clock' colon health.  I recommend that you avoid dairy products for now.  We will follow-up with you in 3 months.  Please let me know if you have recurrent diarrhea.  It was very nice to meet you today!  Ermalinda Memos, PA-C [facility]

## 2022-12-22 LAB — CBC WITH DIFFERENTIAL/PLATELET
Basophils Absolute: 0 10*3/uL (ref 0.0–0.2)
Basos: 0 %
EOS (ABSOLUTE): 0 10*3/uL (ref 0.0–0.4)
Eos: 0 %
Hematocrit: 35.7 % (ref 34.0–46.6)
Hemoglobin: 11.6 g/dL (ref 11.1–15.9)
Immature Grans (Abs): 0 10*3/uL (ref 0.0–0.1)
Immature Granulocytes: 1 %
Lymphocytes Absolute: 1 10*3/uL (ref 0.7–3.1)
Lymphs: 12 %
MCH: 32.5 pg (ref 26.6–33.0)
MCHC: 32.5 g/dL (ref 31.5–35.7)
MCV: 100 fL — ABNORMAL HIGH (ref 79–97)
Monocytes Absolute: 0.3 10*3/uL (ref 0.1–0.9)
Monocytes: 4 %
Neutrophils Absolute: 6.8 10*3/uL (ref 1.4–7.0)
Neutrophils: 83 %
Platelets: 208 10*3/uL (ref 150–450)
RBC: 3.57 x10E6/uL — ABNORMAL LOW (ref 3.77–5.28)
RDW: 11.9 % (ref 11.7–15.4)
WBC: 8.2 10*3/uL (ref 3.4–10.8)

## 2022-12-22 LAB — IRON,TIBC AND FERRITIN PANEL
Ferritin: 50 ng/mL (ref 15–150)
Iron Saturation: 37 % (ref 15–55)
Iron: 111 ug/dL (ref 27–139)
Total Iron Binding Capacity: 301 ug/dL (ref 250–450)
UIBC: 190 ug/dL (ref 118–369)

## 2022-12-26 ENCOUNTER — Encounter: Payer: Self-pay | Admitting: Gastroenterology

## 2023-01-02 DIAGNOSIS — R300 Dysuria: Secondary | ICD-10-CM | POA: Diagnosis not present

## 2023-01-02 DIAGNOSIS — I10 Essential (primary) hypertension: Secondary | ICD-10-CM | POA: Diagnosis not present

## 2023-01-04 ENCOUNTER — Other Ambulatory Visit (HOSPITAL_COMMUNITY): Payer: Self-pay | Admitting: Otolaryngology

## 2023-01-04 DIAGNOSIS — M5459 Other low back pain: Secondary | ICD-10-CM | POA: Diagnosis not present

## 2023-01-04 DIAGNOSIS — M25561 Pain in right knee: Secondary | ICD-10-CM | POA: Diagnosis not present

## 2023-01-04 DIAGNOSIS — M5451 Vertebrogenic low back pain: Secondary | ICD-10-CM | POA: Diagnosis not present

## 2023-01-05 ENCOUNTER — Ambulatory Visit (HOSPITAL_COMMUNITY)
Admission: RE | Admit: 2023-01-05 | Discharge: 2023-01-05 | Disposition: A | Discharge status: Home / Self Care | Payer: Medicare Other | Source: Ambulatory Visit | Attending: Otolaryngology | Admitting: Otolaryngology

## 2023-01-05 DIAGNOSIS — M47817 Spondylosis without myelopathy or radiculopathy, lumbosacral region: Secondary | ICD-10-CM | POA: Diagnosis not present

## 2023-01-05 DIAGNOSIS — M5459 Other low back pain: Secondary | ICD-10-CM | POA: Diagnosis not present

## 2023-01-05 DIAGNOSIS — M47816 Spondylosis without myelopathy or radiculopathy, lumbar region: Secondary | ICD-10-CM | POA: Diagnosis not present

## 2023-01-05 DIAGNOSIS — M5135 Other intervertebral disc degeneration, thoracolumbar region: Secondary | ICD-10-CM | POA: Diagnosis not present

## 2023-01-05 DIAGNOSIS — M48061 Spinal stenosis, lumbar region without neurogenic claudication: Secondary | ICD-10-CM | POA: Diagnosis not present

## 2023-01-18 DIAGNOSIS — Z7689 Persons encountering health services in other specified circumstances: Secondary | ICD-10-CM | POA: Diagnosis not present

## 2023-01-18 DIAGNOSIS — N184 Chronic kidney disease, stage 4 (severe): Secondary | ICD-10-CM | POA: Diagnosis not present

## 2023-01-18 DIAGNOSIS — N2581 Secondary hyperparathyroidism of renal origin: Secondary | ICD-10-CM | POA: Diagnosis not present

## 2023-01-18 DIAGNOSIS — I5032 Chronic diastolic (congestive) heart failure: Secondary | ICD-10-CM | POA: Diagnosis not present

## 2023-01-18 DIAGNOSIS — N189 Chronic kidney disease, unspecified: Secondary | ICD-10-CM | POA: Diagnosis not present

## 2023-01-23 DIAGNOSIS — R300 Dysuria: Secondary | ICD-10-CM | POA: Diagnosis not present

## 2023-01-23 DIAGNOSIS — M5416 Radiculopathy, lumbar region: Secondary | ICD-10-CM | POA: Diagnosis not present

## 2023-01-23 DIAGNOSIS — M25561 Pain in right knee: Secondary | ICD-10-CM | POA: Diagnosis not present

## 2023-01-23 DIAGNOSIS — M545 Low back pain, unspecified: Secondary | ICD-10-CM | POA: Diagnosis not present

## 2023-01-26 DIAGNOSIS — E538 Deficiency of other specified B group vitamins: Secondary | ICD-10-CM | POA: Diagnosis not present

## 2023-01-26 DIAGNOSIS — N184 Chronic kidney disease, stage 4 (severe): Secondary | ICD-10-CM | POA: Diagnosis not present

## 2023-01-26 DIAGNOSIS — N2581 Secondary hyperparathyroidism of renal origin: Secondary | ICD-10-CM | POA: Diagnosis not present

## 2023-01-26 DIAGNOSIS — R809 Proteinuria, unspecified: Secondary | ICD-10-CM | POA: Diagnosis not present

## 2023-01-26 DIAGNOSIS — I129 Hypertensive chronic kidney disease with stage 1 through stage 4 chronic kidney disease, or unspecified chronic kidney disease: Secondary | ICD-10-CM | POA: Diagnosis not present

## 2023-02-06 DIAGNOSIS — I10 Essential (primary) hypertension: Secondary | ICD-10-CM | POA: Diagnosis not present

## 2023-02-06 DIAGNOSIS — I129 Hypertensive chronic kidney disease with stage 1 through stage 4 chronic kidney disease, or unspecified chronic kidney disease: Secondary | ICD-10-CM | POA: Diagnosis not present

## 2023-02-06 DIAGNOSIS — I2510 Atherosclerotic heart disease of native coronary artery without angina pectoris: Secondary | ICD-10-CM | POA: Diagnosis not present

## 2023-02-06 DIAGNOSIS — R300 Dysuria: Secondary | ICD-10-CM | POA: Diagnosis not present

## 2023-02-14 DIAGNOSIS — E538 Deficiency of other specified B group vitamins: Secondary | ICD-10-CM | POA: Diagnosis not present

## 2023-02-14 DIAGNOSIS — N184 Chronic kidney disease, stage 4 (severe): Secondary | ICD-10-CM | POA: Diagnosis not present

## 2023-02-14 DIAGNOSIS — Z0001 Encounter for general adult medical examination with abnormal findings: Secondary | ICD-10-CM | POA: Diagnosis not present

## 2023-02-14 DIAGNOSIS — I10 Essential (primary) hypertension: Secondary | ICD-10-CM | POA: Diagnosis not present

## 2023-02-14 DIAGNOSIS — D509 Iron deficiency anemia, unspecified: Secondary | ICD-10-CM | POA: Diagnosis not present

## 2023-02-14 DIAGNOSIS — E785 Hyperlipidemia, unspecified: Secondary | ICD-10-CM | POA: Diagnosis not present

## 2023-02-14 DIAGNOSIS — I2510 Atherosclerotic heart disease of native coronary artery without angina pectoris: Secondary | ICD-10-CM | POA: Diagnosis not present

## 2023-02-21 ENCOUNTER — Ambulatory Visit: Payer: Medicare Other | Admitting: Obstetrics & Gynecology

## 2023-02-21 ENCOUNTER — Encounter: Payer: Self-pay | Admitting: Obstetrics & Gynecology

## 2023-02-21 VITALS — BP 139/77 | HR 74

## 2023-02-21 DIAGNOSIS — R102 Pelvic and perineal pain: Secondary | ICD-10-CM | POA: Diagnosis not present

## 2023-02-21 DIAGNOSIS — E538 Deficiency of other specified B group vitamins: Secondary | ICD-10-CM | POA: Diagnosis not present

## 2023-02-21 MED ORDER — ESTRADIOL 0.1 MG/GM VA CREA: TOPICAL_CREAM | VAGINAL | 12 refills | Status: DC

## 2023-02-21 NOTE — Progress Notes (Signed)
Chief Complaint  Patient presents with   Pelvic Pain      89 y.o. G4P4 No LMP recorded. Patient has had a hysterectomy. The current method of family planning is status post hysterectomy/BSO  Outpatient Encounter Medications as of 02/21/2023  Medication Sig   ALPRAZolam (XANAX) 0.5 MG tablet Take 0.5 mg by mouth 3 (three) times daily as needed.   amLODipine (NORVASC) 10 MG tablet Take 1 tablet by mouth once daily   Cyanocobalamin 1000 MCG/ML KIT    estradiol (ESTRACE) 0.1 MG/GM vaginal cream 1 gram at bedtime every other night   FLAXSEED, LINSEED, PO Take 1 capsule by mouth daily.    furosemide (LASIX) 40 MG tablet Take 40 mg by mouth as needed.    isosorbide mononitrate (IMDUR) 120 MG 24 hr tablet Take 1 tablet by mouth once daily   nitroGLYCERIN (NITROSTAT) 0.4 MG SL tablet Place 1 tablet (0.4 mg total) under the tongue every 5 (five) minutes as needed for chest pain.   rosuvastatin (CRESTOR) 5 MG tablet Take 1 tablet (5 mg total) by mouth daily.   sodium bicarbonate 650 MG tablet Take 650 mg by mouth 4 (four) times daily.   metoprolol succinate (TOPROL-XL) 50 MG 24 hr tablet Take 1 tablet (50 mg total) by mouth daily. (Patient not taking: Reported on 12/21/2022)   [DISCONTINUED] aspirin EC 81 MG tablet Take 81 mg by mouth daily. Swallow whole.   [DISCONTINUED] predniSONE (DELTASONE) 10 MG tablet Take by mouth.   [DISCONTINUED] Vitamin D, Ergocalciferol, (DRISDOL) 50000 units CAPS capsule    No facility-administered encounter medications on file as of 02/21/2023.    Subjective Pt is s/p TAH BSO in the distant past, CT scan 2018 confirms Pt reports a throbbing pain to the left of the midline low like internal vaginal  No bleeding No discharge Had right vulvar abscess 09/2022 that was treated by Dr Charlotta Newton with I&D, local care and bactrim ds, the area that pt is complaining of today is not near this area  She was then seen by Dr Charlotta Newton 8/24 for diarrhea-->C dificile was  negative Continued and was referred to GI, it seemed to spontaneously resolve  She has been putting hydrocortisone 1% cream in the vagina with improvement She also took one of he rhusbands UTI meds and crucshed it up and put in her vagina which she states heped  Past Medical History:  Diagnosis Date   Anemia    Anxiety    Arthritis    Bleeding from the nose Oct 2014   CAD (coronary artery disease)    a. s/p rotational atherectomy and DES placement to LAD in 2012 b. low-risk NST in 08/2018   Cataract of both eyes    Chronic kidney disease    Colon polyps    Hyperlipidemia    Hypertension    x 5-6 yrs.   LBBB (left bundle branch block)    Renal insufficiency     Past Surgical History:  Procedure Laterality Date   BREAST SURGERY     masses removed while she was pregnant   CARDIAC CATHETERIZATION  06/20/2008   severe single vessel high-grade stenosis of mid LAD at bifurcation of diagonal 1 and setpal perforator 1; diffuse coronary calcification of L coronary system; 50% stenosis of mid RCA (Dr. Evlyn Courier)   CATARACT EXTRACTION W/PHACO Right 10/10/2013   Procedure: CATARACT EXTRACTION PHACO AND INTRAOCULAR LENS PLACEMENT (IOC);  Surgeon: Gemma Payor, MD;  Location: [facility];  Service: Ophthalmology;  Laterality: Right;  CDE:11.96   CATARACT EXTRACTION W/PHACO Left 11/18/2013   Procedure: CATARACT EXTRACTION PHACO AND INTRAOCULAR LENS PLACEMENT (IOC);  Surgeon: Gemma Payor, MD;  Location: [facility];  Service: Ophthalmology;  Laterality: Left;  CDE 15.96   COLONOSCOPY N/A 01/16/2013   Procedure: COLONOSCOPY;  Surgeon: Malissa Hippo, MD; igmoid colon diverticulosis as well as changes of diverticulitis involving one diverticulum. She also had external hemorrhoids.   CORONARY ANGIOPLASTY WITH STENT PLACEMENT  11/08/2010   complex high-speed rotational atherectomy of calcified LAD, diffusely disease LAD system (Dr. Bishop Limbo)   EXCISION ORAL TUMOR Right 01/14/2014   Procedure: EXCISION  OROPHARYNGEAL MASS;  Surgeon: Darletta Moll, MD;  Location: [facility];  Service: ENT;  Laterality: Right;   NM MYOCAR PERF WALL MOTION  05/10/2011   lexiscan myoview; normal pattern of perfusion in all regions; post-stress EF 55%; low risk scan    PARTIAL HYSTERECTOMY     TONSILLECTOMY     TRANSTHORACIC ECHOCARDIOGRAM  10/08/2010   EF 45-50%, mod conc LVH, grade 1 diastolic dysfunction, mildly calcified left AV cusp; calcified MV annulus    OB History     Gravida  4   Para  4   Term      Preterm      AB      Living  3      SAB      IAB      Ectopic      Multiple      Live Births              Allergies  Allergen Reactions   Macrobid [Nitrofurantoin] Nausea And Vomiting    Social History   Socioeconomic History   Marital status: Married    Spouse name: Not on file   Number of children: 4   Years of education: 8   Highest education level: Not on file  Occupational History   Not on file  Tobacco Use   Smoking status: Never   Smokeless tobacco: Never  Vaping Use   Vaping status: Never Used  Substance and Sexual Activity   Alcohol use: No   Drug use: No   Sexual activity: Yes    Birth control/protection: Surgical  Other Topics Concern   Not on file  Social History Narrative   Not on file   Social Determinants of Health   Financial Resource Strain: Not on file  Food Insecurity: Not on file  Transportation Needs: Not on file  Physical Activity: Not on file  Stress: Not on file  Social Connections: Not on file    Family History  Problem Relation Age of Onset   Liver cancer Mother    Cancer Father    Heart Problems Brother    Coronary artery disease Sister        stent   Heart Problems Daughter        also stomach problems   COPD Daughter    Colon cancer Neg Hx     Medications:       Current Outpatient Medications:    ALPRAZolam (XANAX) 0.5 MG tablet, Take 0.5 mg by mouth 3 (three) times daily as needed., Disp: , Rfl:     amLODipine (NORVASC) 10 MG tablet, Take 1 tablet by mouth once daily, Disp: 90 tablet, Rfl: 0   Cyanocobalamin 1000 MCG/ML KIT, , Disp: , Rfl:    estradiol (ESTRACE) 0.1 MG/GM vaginal cream, 1 gram at bedtime every other night, Disp: 30 g,  Rfl: 12   FLAXSEED, LINSEED, PO, Take 1 capsule by mouth daily. , Disp: , Rfl:    furosemide (LASIX) 40 MG tablet, Take 40 mg by mouth as needed. , Disp: , Rfl:    isosorbide mononitrate (IMDUR) 120 MG 24 hr tablet, Take 1 tablet by mouth once daily, Disp: 90 tablet, Rfl: 2   nitroGLYCERIN (NITROSTAT) 0.4 MG SL tablet, Place 1 tablet (0.4 mg total) under the tongue every 5 (five) minutes as needed for chest pain., Disp: 25 tablet, Rfl: 3   rosuvastatin (CRESTOR) 5 MG tablet, Take 1 tablet (5 mg total) by mouth daily., Disp: 90 tablet, Rfl: 1   sodium bicarbonate 650 MG tablet, Take 650 mg by mouth 4 (four) times daily., Disp: , Rfl:    metoprolol succinate (TOPROL-XL) 50 MG 24 hr tablet, Take 1 tablet (50 mg total) by mouth daily. (Patient not taking: Reported on 12/21/2022), Disp: , Rfl:   Objective Blood pressure 139/77, pulse 74.  No evidence of vulvr abscess anywhere  Vagina with Grade 2 vaginal apex prolapse No discharge noted, no vaginal abnormalities No plapable lesions Bimanual is negative for midline or adnexal masses  Pertinent ROS No burning with urination, frequency or urgency No nausea, vomiting or diarrhea Nor fever chills or other constitutional symptoms   Labs or studies Reviewed scans and labs     Impression + Management Plan: Diagnoses this Encounter::   ICD-10-CM   1. Vaginal pain, left sidewall, normal exam except apex prolapse  R10.2    Rx estradiol cream qohs, follow up in 3 months        Medications prescribed during  this encounter: Meds ordered this encounter  Medications   estradiol (ESTRACE) 0.1 MG/GM vaginal cream    Sig: 1 gram at bedtime every other night    Dispense:  30 g    Refill:  12    Labs or  Scans Ordered during this encounter: No orders of the defined types were placed in this encounter.     Follow up Return in about 3 months (around 05/24/2023) for Follow up, with Dr Despina Hidden.

## 2023-02-27 DIAGNOSIS — M25561 Pain in right knee: Secondary | ICD-10-CM | POA: Diagnosis not present

## 2023-03-01 ENCOUNTER — Ambulatory Visit: Payer: Medicare Other | Admitting: Obstetrics & Gynecology

## 2023-03-04 ENCOUNTER — Emergency Department (HOSPITAL_COMMUNITY): Payer: Medicare Other

## 2023-03-04 ENCOUNTER — Observation Stay (HOSPITAL_COMMUNITY)
Admission: EM | Admit: 2023-03-04 | Discharge: 2023-03-06 | Disposition: A | Discharge status: Home Health | Payer: Medicare Other | Attending: Family Medicine | Admitting: Family Medicine

## 2023-03-04 ENCOUNTER — Other Ambulatory Visit: Payer: Self-pay

## 2023-03-04 DIAGNOSIS — F419 Anxiety disorder, unspecified: Secondary | ICD-10-CM | POA: Diagnosis present

## 2023-03-04 DIAGNOSIS — K5730 Diverticulosis of large intestine without perforation or abscess without bleeding: Secondary | ICD-10-CM | POA: Diagnosis not present

## 2023-03-04 DIAGNOSIS — M85861 Other specified disorders of bone density and structure, right lower leg: Secondary | ICD-10-CM | POA: Diagnosis not present

## 2023-03-04 DIAGNOSIS — R0781 Pleurodynia: Secondary | ICD-10-CM | POA: Diagnosis present

## 2023-03-04 DIAGNOSIS — N189 Chronic kidney disease, unspecified: Secondary | ICD-10-CM | POA: Insufficient documentation

## 2023-03-04 DIAGNOSIS — S52602A Unspecified fracture of lower end of left ulna, initial encounter for closed fracture: Secondary | ICD-10-CM | POA: Diagnosis not present

## 2023-03-04 DIAGNOSIS — S62109A Fracture of unspecified carpal bone, unspecified wrist, initial encounter for closed fracture: Secondary | ICD-10-CM | POA: Diagnosis present

## 2023-03-04 DIAGNOSIS — S6292XA Unspecified fracture of left wrist and hand, initial encounter for closed fracture: Secondary | ICD-10-CM | POA: Diagnosis not present

## 2023-03-04 DIAGNOSIS — M25532 Pain in left wrist: Secondary | ICD-10-CM | POA: Diagnosis not present

## 2023-03-04 DIAGNOSIS — I119 Hypertensive heart disease without heart failure: Secondary | ICD-10-CM | POA: Diagnosis present

## 2023-03-04 DIAGNOSIS — Z79899 Other long term (current) drug therapy: Secondary | ICD-10-CM | POA: Diagnosis not present

## 2023-03-04 DIAGNOSIS — M8580 Other specified disorders of bone density and structure, unspecified site: Secondary | ICD-10-CM | POA: Diagnosis not present

## 2023-03-04 DIAGNOSIS — R0789 Other chest pain: Secondary | ICD-10-CM | POA: Insufficient documentation

## 2023-03-04 DIAGNOSIS — S329XXA Fracture of unspecified parts of lumbosacral spine and pelvis, initial encounter for closed fracture: Secondary | ICD-10-CM | POA: Diagnosis present

## 2023-03-04 DIAGNOSIS — S52532A Colles' fracture of left radius, initial encounter for closed fracture: Principal | ICD-10-CM | POA: Insufficient documentation

## 2023-03-04 DIAGNOSIS — S0990XA Unspecified injury of head, initial encounter: Secondary | ICD-10-CM | POA: Insufficient documentation

## 2023-03-04 DIAGNOSIS — I129 Hypertensive chronic kidney disease with stage 1 through stage 4 chronic kidney disease, or unspecified chronic kidney disease: Secondary | ICD-10-CM | POA: Insufficient documentation

## 2023-03-04 DIAGNOSIS — S32502A Unspecified fracture of left pubis, initial encounter for closed fracture: Secondary | ICD-10-CM | POA: Insufficient documentation

## 2023-03-04 DIAGNOSIS — I6782 Cerebral ischemia: Secondary | ICD-10-CM | POA: Diagnosis not present

## 2023-03-04 DIAGNOSIS — M19032 Primary osteoarthritis, left wrist: Secondary | ICD-10-CM | POA: Diagnosis not present

## 2023-03-04 DIAGNOSIS — S52572A Other intraarticular fracture of lower end of left radius, initial encounter for closed fracture: Secondary | ICD-10-CM | POA: Diagnosis not present

## 2023-03-04 DIAGNOSIS — G8911 Acute pain due to trauma: Secondary | ICD-10-CM | POA: Diagnosis not present

## 2023-03-04 DIAGNOSIS — I672 Cerebral atherosclerosis: Secondary | ICD-10-CM | POA: Diagnosis not present

## 2023-03-04 DIAGNOSIS — Z743 Need for continuous supervision: Secondary | ICD-10-CM | POA: Diagnosis not present

## 2023-03-04 DIAGNOSIS — I2510 Atherosclerotic heart disease of native coronary artery without angina pectoris: Secondary | ICD-10-CM | POA: Diagnosis not present

## 2023-03-04 DIAGNOSIS — S32512A Fracture of superior rim of left pubis, initial encounter for closed fracture: Principal | ICD-10-CM

## 2023-03-04 DIAGNOSIS — S52612A Displaced fracture of left ulna styloid process, initial encounter for closed fracture: Secondary | ICD-10-CM | POA: Diagnosis not present

## 2023-03-04 DIAGNOSIS — M47816 Spondylosis without myelopathy or radiculopathy, lumbar region: Secondary | ICD-10-CM | POA: Diagnosis not present

## 2023-03-04 DIAGNOSIS — W19XXXA Unspecified fall, initial encounter: Secondary | ICD-10-CM | POA: Diagnosis not present

## 2023-03-04 DIAGNOSIS — S6992XA Unspecified injury of left wrist, hand and finger(s), initial encounter: Secondary | ICD-10-CM | POA: Diagnosis not present

## 2023-03-04 DIAGNOSIS — Z043 Encounter for examination and observation following other accident: Secondary | ICD-10-CM | POA: Diagnosis not present

## 2023-03-04 DIAGNOSIS — R6889 Other general symptoms and signs: Secondary | ICD-10-CM | POA: Diagnosis not present

## 2023-03-04 DIAGNOSIS — Z955 Presence of coronary angioplasty implant and graft: Secondary | ICD-10-CM | POA: Insufficient documentation

## 2023-03-04 DIAGNOSIS — M1711 Unilateral primary osteoarthritis, right knee: Secondary | ICD-10-CM | POA: Diagnosis not present

## 2023-03-04 LAB — BASIC METABOLIC PANEL
Anion gap: 11 (ref 5–15)
BUN: 54 mg/dL — ABNORMAL HIGH (ref 8–23)
CO2: 24 mmol/L (ref 22–32)
Calcium: 8.7 mg/dL — ABNORMAL LOW (ref 8.9–10.3)
Chloride: 104 mmol/L (ref 98–111)
Creatinine, Ser: 2.3 mg/dL — ABNORMAL HIGH (ref 0.44–1.00)
GFR, Estimated: 20 mL/min — ABNORMAL LOW (ref >60)
Glucose, Bld: 113 mg/dL — ABNORMAL HIGH (ref 70–99)
Potassium: 3.6 mmol/L (ref 3.5–5.1)
Sodium: 139 mmol/L (ref 135–145)

## 2023-03-04 LAB — CBC
HCT: 31.4 % — ABNORMAL LOW (ref 36.0–46.0)
Hemoglobin: 10.2 g/dL — ABNORMAL LOW (ref 12.0–15.0)
MCH: 33.1 pg (ref 26.0–34.0)
MCHC: 32.5 g/dL (ref 30.0–36.0)
MCV: 101.9 fL — ABNORMAL HIGH (ref 80.0–100.0)
Platelets: 147 10*3/uL — ABNORMAL LOW (ref 150–400)
RBC: 3.08 MIL/uL — ABNORMAL LOW (ref 3.87–5.11)
RDW: 12.7 % (ref 11.5–15.5)
WBC: 8.5 10*3/uL (ref 4.0–10.5)
nRBC: 0 % (ref 0.0–0.2)

## 2023-03-04 MED ORDER — MORPHINE SULFATE (PF) 2 MG/ML IV SOLN
2.0000 mg | Freq: Once | INTRAVENOUS | Status: AC
  Administered 2023-03-04: 2 mg via INTRAVENOUS
  Filled 2023-03-04: qty 1

## 2023-03-04 MED ORDER — PROPOFOL 10 MG/ML IV BOLUS
INTRAVENOUS | Status: DC | PRN
  Administered 2023-03-04: 20 mg via INTRAVENOUS

## 2023-03-04 MED ORDER — PROPOFOL 10 MG/ML IV BOLUS
30.0000 mg | Freq: Once | INTRAVENOUS | Status: AC
Start: 2023-03-04 — End: 2023-03-04
  Administered 2023-03-04: 30 mg via INTRAVENOUS
  Filled 2023-03-04: qty 20

## 2023-03-04 NOTE — ED Notes (Signed)
Patient transported to CT 

## 2023-03-04 NOTE — ED Triage Notes (Signed)
Pt arrived via [facility] from home c/o a fall today in which she fell on L arm/wrist--some deformity and swelling noted to area. Rates pain a 10/10. Denies LOC, denies hitting head, denies use of blood thinners

## 2023-03-04 NOTE — ED Notes (Signed)
Pt assisted to a standing position to walk to the bathroom. Upon standing, pain in her left hip increased significantly. Pt was unable to take more than one step. Bedside commode brought to the patient.

## 2023-03-04 NOTE — ED Provider Notes (Signed)
[place] [facility] AT [facility] Provider Note   CSN: [id number] Arrival date & time: 03/04/23  1633     History  Chief Complaint  Patient presents with   Fall    [name] is a 89 y.o. female.  Is an 89 year old female present emergency department for mechanical fall.  She tripped over the door frame going on the back door FOOSH with pain to her left wrist and forearm.  Did not hit her head, does not take blood thinners.  Complaining of pain to the left wrist, left hip as well as the right knee.   Fall       Home Medications Prior to Admission medications   Medication Sig Start Date End Date Taking? Authorizing Provider  ALPRAZolam Prudy Feeler) 0.5 MG tablet Take 0.5 mg by mouth 3 (three) times daily as needed. 06/05/20   [provider]  amLODipine (NORVASC) 10 MG tablet Take 1 tablet by mouth once daily 06/07/21   Antoine Poche, MD  Cyanocobalamin 1000 MCG/ML KIT     [provider]  estradiol (ESTRACE) 0.1 MG/GM vaginal cream 1 gram at bedtime every other night 02/21/23   Lazaro Arms, MD  FLAXSEED, LINSEED, PO Take 1 capsule by mouth daily.     [provider]  furosemide (LASIX) 40 MG tablet Take 40 mg by mouth as needed.  12/25/17   [provider]  isosorbide mononitrate (IMDUR) 120 MG 24 hr tablet Take 1 tablet by mouth once daily 11/14/22   Antoine Poche, MD  metoprolol succinate (TOPROL-XL) 50 MG 24 hr tablet Take 1 tablet (50 mg total) by mouth daily. Patient not taking: Reported on 12/21/2022 06/27/22   Antoine Poche, MD  nitroGLYCERIN (NITROSTAT) 0.4 MG SL tablet Place 1 tablet (0.4 mg total) under the tongue every 5 (five) minutes as needed for chest pain. 08/01/22   Antoine Poche, MD  rosuvastatin (CRESTOR) 5 MG tablet Take 1 tablet (5 mg total) by mouth daily. 02/25/20   Antoine Poche, MD  sodium bicarbonate 650 MG tablet Take 650 mg by mouth 4 (four) times daily.    [provider]      Allergies    Macrobid [nitrofurantoin]    Review of Systems   Review of Systems  Physical Exam Updated Vital Signs BP 137/68   Pulse 97   Temp 98.2 F (36.8 C) (Oral)   Resp 17   Ht 5\' 2"  (1.575 m)   Wt 68.5 kg   SpO2 97%   BMI 27.62 kg/m  Physical Exam Vitals and nursing note reviewed.  Constitutional:      General: She is not in acute distress.    Appearance: She is not toxic-appearing.  HENT:     Head: Normocephalic and atraumatic.     Nose: Nose normal.     Mouth/Throat:     Mouth: Mucous membranes are moist.  Eyes:     Conjunctiva/sclera: Conjunctivae normal.  Cardiovascular:     Rate and Rhythm: Normal rate and regular rhythm.  Pulmonary:     Effort: Pulmonary effort is normal. No respiratory distress.     Breath sounds: Normal breath sounds.  Abdominal:     General: Abdomen is flat. There is no distension.     Palpations: Abdomen is soft.     Tenderness: There is no abdominal tenderness. There is no guarding or rebound.  Musculoskeletal:     Cervical back: Normal range of motion. No  tenderness.     Comments: Chest wall stable nontender.  Pelvis stable some minor tenderness to the left hip.  No midline spinal tenderness.  No tenderness to the lower extremities.  Right upper extremity with full ROM, no tenderness.  Left upper extremity with what appears to be deformity to the distal radius.  She has strong pulses and brisk cap refill.  She is able to wiggle fingers and somewhat make an okay sign, but limited secondary to pain.  Soft compartments.  Neurological:     General: No focal deficit present.     Mental Status: She is alert and oriented to person, place, and time.  Psychiatric:        Mood and Affect: Mood normal.        Behavior: Behavior normal.     ED Results / Procedures / Treatments   Labs (all labs ordered are listed, but only abnormal results are displayed) Labs Reviewed  CBC - Abnormal; Notable for the following  components:      Result Value   RBC 3.08 (*)    Hemoglobin 10.2 (*)    HCT 31.4 (*)    MCV 101.9 (*)    Platelets 147 (*)    All other components within normal limits  BASIC METABOLIC PANEL - Abnormal; Notable for the following components:   Glucose, Bld 113 (*)    BUN 54 (*)    Creatinine, Ser 2.30 (*)    Calcium 8.7 (*)    GFR, Estimated 20 (*)    All other components within normal limits    EKG None  Radiology CT PELVIS WO CONTRAST  Result Date: 03/04/2023 CLINICAL DATA:  Larey Seat, abnormal pelvic x-ray EXAM: CT PELVIS WITHOUT CONTRAST TECHNIQUE: Multidetector CT imaging of the pelvis was performed following the standard protocol without intravenous contrast. RADIATION DOSE REDUCTION: This exam was performed according to the departmental dose-optimization program which includes automated exposure control, adjustment of the mA and/or kV according to patient size and/or use of iterative reconstruction technique. COMPARISON:  03/04/2023, 12/19/2016 FINDINGS: Urinary Tract: Distal ureters and bladder are unremarkable. No urinary tract calculi. Bowel: No bowel obstruction or ileus. Normal appendix right lower quadrant. Diffuse diverticulosis throughout the distal colon, with no evidence of acute diverticulitis. No bowel wall thickening or inflammatory change. Vascular/Lymphatic: Atherosclerosis of the aorta and iliac vessels. No pathologic adenopathy. Reproductive:  Prior hysterectomy.  No adnexal masses. Other: No free fluid or free intraperitoneal gas. No abdominal wall hernia. Musculoskeletal: Nondisplaced fractures are seen through the left superior and inferior pubic rami in a parasymphyseal location, corresponding to the cortical irregularity on recent x-ray. There is also subtle cortical irregularity involving the anterior margin of the left sacral ala inferiorly, consistent with nondisplaced sacral ale are fracture. No other acute bony abnormalities.  Hips are well aligned. Reconstructed  images demonstrate no additional findings. IMPRESSION: 1. Nondisplaced parasymphyseal left superior and inferior pubic rami fractures, corresponding to the cortical irregularity seen on recent x-ray. 2. Suspected nondisplaced left sacral alar fracture as above. 3.  Aortic Atherosclerosis (ICD10-I70.0). 4. Distal colonic diverticulosis without diverticulitis. Electronically Signed   By: Sharlet Salina M.D.   On: 03/04/2023 20:40   CT Head Wo Contrast  Result Date: 03/04/2023 CLINICAL DATA:  Larey Seat on left arm/wrist with deformity and swelling noted to the area. Minor head trauma. EXAM: CT HEAD WITHOUT CONTRAST TECHNIQUE: Contiguous axial images were obtained from the base of the skull through the vertex without intravenous contrast. RADIATION DOSE REDUCTION: This exam was  performed according to the departmental dose-optimization program which includes automated exposure control, adjustment of the mA and/or kV according to patient size and/or use of iterative reconstruction technique. COMPARISON:  09/19/2022 FINDINGS: Brain: No intracranial hemorrhage, mass effect, or evidence of acute infarct. No hydrocephalus. No extra-axial fluid collection. Age-commensurate cerebral atrophy and chronic small vessel ischemic disease. Chronic bilateral cerebellar infarcts. Vascular: No hyperdense vessel. Intracranial arterial calcification. Skull: No fracture or focal lesion. Sinuses/Orbits: No acute finding. Other: None. IMPRESSION: No acute intracranial abnormality. Electronically Signed   By: Minerva Fester M.D.   On: 03/04/2023 19:06   DG Chest 1 View  Result Date: 03/04/2023 CLINICAL DATA:  fall EXAM: CHEST  1 VIEW COMPARISON:  November 24, 2020 FINDINGS: The cardiomediastinal silhouette is unchanged and enlarged in contour.Atherosclerotic calcifications. No pleural effusion. No pneumothorax. No acute pleuroparenchymal abnormality. IMPRESSION: No acute cardiopulmonary abnormality. Electronically Signed   By: Meda Klinefelter M.D.   On: 03/04/2023 19:06   DG Pelvis 1-2 Views  Result Date: 03/04/2023 CLINICAL DATA:  fall EXAM: PELVIS - 1-2 VIEW COMPARISON:  October 18, 2012. December 19, 2016 FINDINGS: Minimal cortical offset of the LEFT superior pubic ramus adjacent to the pubic symphysis. Possible lucency traversing the LEFT inferior pubic ramus. Osteopenia. Vascular calcifications. Degenerative changes of the lower lumbar spine. Sacrum is obscured by overlapping bowel contents. IMPRESSION: Minimal cortical offset of the LEFT superior pubic ramus adjacent to the pubic symphysis. Possible lucency traversing the LEFT inferior pubic ramus. These could reflect nondisplaced fractures. Recommend correlation with point tenderness. If further imaging is desired, CT pelvis would be recommended given osteopenia. Electronically Signed   By: Meda Klinefelter M.D.   On: 03/04/2023 19:05   DG Forearm Left  Result Date: 03/04/2023 CLINICAL DATA:  fall EXAM: LEFT FOREARM - 2 VIEW COMPARISON:  None Available. FINDINGS: There is an impacted, angulated and comminuted fracture of the distal radius with intra-articular extension and apex palmar angulation. There is a minimally displaced fracture of the distal ulnar styloid. Limited assessment of the radiocarpal articulation. Severe degenerative changes of the first CMC. Soft tissue edema. IMPRESSION: 1. Impacted, angulated and comminuted fracture of the distal radius with intra-articular extension. 2. Minimally displaced fracture of the distal ulnar styloid. Electronically Signed   By: Meda Klinefelter M.D.   On: 03/04/2023 19:01   DG Knee 2 Views Right  Result Date: 03/04/2023 CLINICAL DATA:  fall EXAM: RIGHT KNEE - 1-2 VIEW COMPARISON:  June 15, 2022 FINDINGS: Osteopenia. No acute fracture or dislocation. Moderate joint space narrowing of the lateral compartment with subcortical sclerosis. Medial and patellofemoral compartments are relatively preserved. No area of erosion or  osseous destruction. No unexpected radiopaque foreign body. Vascular calcifications. IMPRESSION: 1. No acute fracture or dislocation. 2. Moderate degenerative changes of the lateral compartment. Electronically Signed   By: Meda Klinefelter M.D.   On: 03/04/2023 18:59    Procedures .Sedation  Date/Time: 03/04/2023 11:41 PM  Performed by: Coral Spikes, DO Authorized by: Coral Spikes, DO   Consent:    Consent obtained:  Verbal and written   Consent given by:  Patient   Risks discussed:  Allergic reaction, prolonged hypoxia resulting in organ damage, prolonged sedation necessitating reversal, inadequate sedation and respiratory compromise necessitating ventilatory assistance and intubation   Alternatives discussed:  Analgesia without sedation Universal protocol:    Procedure explained and questions answered to patient or proxy's satisfaction: yes     Relevant documents present and verified: yes     Test  results available: yes     Imaging studies available: yes     Immediately prior to procedure, a time out was called: yes   Indications:    Procedure performed:  Fracture reduction   Procedure necessitating sedation performed by:  Physician performing sedation Pre-sedation assessment:    Time since last food or drink:  5   ASA classification: class 2 - patient with mild systemic disease     Mouth opening:  3 or more finger widths   Thyromental distance:  2 finger widths   Mallampati score:  II - soft palate, uvula, fauces visible   Neck mobility: normal     Pre-sedation assessments completed and reviewed: airway patency, cardiovascular function, hydration status, mental status, pain level and respiratory function   Immediate pre-procedure details:    Reassessment: Patient reassessed immediately prior to procedure     Reviewed: vital signs and relevant labs/tests     Verified: bag valve mask available, emergency equipment available, intubation equipment available, IV patency  confirmed and oxygen available   Procedure details (see MAR for exact dosages):    Preoxygenation:  Nasal cannula   Sedation:  Propofol   Intended level of sedation: deep   Analgesia:  Morphine   Intra-procedure monitoring:  Blood pressure monitoring, continuous capnometry, frequent LOC assessments, frequent vital sign checks, continuous pulse oximetry and cardiac monitor   Intra-procedure events: none     Total Provider sedation time (minutes):  20 Post-procedure details:    Attendance: Constant attendance by certified staff until patient recovered     Recovery: Patient returned to pre-procedure baseline     Procedure completion:  Tolerated well, no immediate complications .Ortho Injury Treatment  Date/Time: 03/04/2023 11:42 PM  Performed by: Coral Spikes, DO Authorized by: Coral Spikes, DO   Consent:    Consent obtained:  Verbal and written   Consent given by:  Patient   Risks discussed:  FractureInjury location: forearm Location details: left forearm Injury type: fracture Fracture type: distal radius Pre-procedure distal perfusion: normal Pre-procedure neurological function: normal Pre-procedure range of motion: reduced  Anesthesia: Local anesthesia used: no  Patient sedated: Yes. Refer to sedation procedure documentation for details of sedation. Manipulation performed: yes Reduction successful: yes X-ray confirmed reduction: yes Immobilization: splint Splint type: sugar tong Splint Applied by: ED Provider Supplies used: Ortho-Glass Post-procedure neurovascular assessment: post-procedure neurovascularly intact Post-procedure distal perfusion: normal Post-procedure neurological function: normal       Medications Ordered in ED Medications  propofol (DIPRIVAN) 10 mg/mL bolus/IV push (20 mg Intravenous Given 03/04/23 2309)  morphine (PF) 2 MG/ML injection 2 mg (2 mg Intravenous Given 03/04/23 1704)  propofol (DIPRIVAN) 10 mg/mL bolus/IV push 30 mg (30 mg  Intravenous Given by Other 03/04/23 2308)  morphine (PF) 2 MG/ML injection 2 mg (2 mg Intravenous Given 03/04/23 2232)    ED Course/ Medical Decision Making/ A&P Clinical Course as of 03/04/23 2345  Sat Mar 04, 2023  2045 CT PELVIS WO CONTRAST IMPRESSION: 1. Nondisplaced parasymphyseal left superior and inferior pubic rami fractures, corresponding to the cortical irregularity seen on recent x-ray. 2. Suspected nondisplaced left sacral alar fracture as above. 3.  Aortic Atherosclerosis (ICD10-I70.0). 4. Distal colonic diverticulosis without diverticulitis   [TY]  2339 Patient continues to have pain in her pelvis.  Pelvis x-ray with possible fracture.  Follow-up CT scan with superior and inferior rami fractures.  Continues to have pain despite IV morphine.  Discussed with Ortho and Dr. Aundria Rud no acute intervention needed for  these fractures.  Weightbearing as tolerated, pain control and mobilization with PT.   [TY]  2344 Basic labs reviewed.  Shows no leukocytosis.  Stable anemia.  Basic metabolic panel with baseline CKD.  Will admit patient for pain control and PT. [TY]    Clinical Course User Index [TY] Coral Spikes, DO                                 Medical Decision Making This is a 89 year old female presenting emergency department for mechanical fall.  Vital signs reassuring.  Has appears to be deformity to left wrist, but no other overt signs of trauma.  Will give IV morphine for pain, will get advanced imaging of head, chest x-ray, pelvis x-ray, forearm and right knee.  See ED course for further MDM and disposition.  Amount and/or Complexity of Data Reviewed External Data Reviewed:     Details: Does not take blood thinner per chart review. Labs: ordered.    Details: Consider labs, however mechanical fall.  Labs unlikely to change management or disposition at this time.  Will forego labs unless admission/surgery is indicated Radiology: ordered. Decision-making details  documented in ED Course.  Risk Prescription drug management. Decision regarding hospitalization. Diagnosis or treatment significantly limited by social determinants of health. Risk Details: Advanced age         Final Clinical Impression(s) / ED Diagnoses Final diagnoses:  Closed fracture of superior ramus of left pubis, initial encounter [facility]  Closed Colles' fracture of left radius, initial encounter    Rx / DC Orders ED Discharge Orders     None         Coral Spikes, DO 03/04/23 2345

## 2023-03-05 DIAGNOSIS — R0789 Other chest pain: Secondary | ICD-10-CM | POA: Diagnosis present

## 2023-03-05 DIAGNOSIS — S62102A Fracture of unspecified carpal bone, left wrist, initial encounter for closed fracture: Secondary | ICD-10-CM

## 2023-03-05 DIAGNOSIS — W19XXXA Unspecified fall, initial encounter: Secondary | ICD-10-CM

## 2023-03-05 DIAGNOSIS — F419 Anxiety disorder, unspecified: Secondary | ICD-10-CM | POA: Diagnosis present

## 2023-03-05 DIAGNOSIS — I119 Hypertensive heart disease without heart failure: Secondary | ICD-10-CM

## 2023-03-05 DIAGNOSIS — I2510 Atherosclerotic heart disease of native coronary artery without angina pectoris: Secondary | ICD-10-CM

## 2023-03-05 DIAGNOSIS — S62109A Fracture of unspecified carpal bone, unspecified wrist, initial encounter for closed fracture: Secondary | ICD-10-CM | POA: Diagnosis present

## 2023-03-05 DIAGNOSIS — S329XXA Fracture of unspecified parts of lumbosacral spine and pelvis, initial encounter for closed fracture: Secondary | ICD-10-CM | POA: Diagnosis not present

## 2023-03-05 DIAGNOSIS — R0781 Pleurodynia: Secondary | ICD-10-CM | POA: Diagnosis present

## 2023-03-05 LAB — CBC WITH DIFFERENTIAL/PLATELET
Abs Immature Granulocytes: 0.02 10*3/uL (ref 0.00–0.07)
Basophils Absolute: 0 10*3/uL (ref 0.0–0.1)
Basophils Relative: 0 %
Eosinophils Absolute: 0.2 10*3/uL (ref 0.0–0.5)
Eosinophils Relative: 4 %
HCT: 30.5 % — ABNORMAL LOW (ref 36.0–46.0)
Hemoglobin: 9.8 g/dL — ABNORMAL LOW (ref 12.0–15.0)
Immature Granulocytes: 0 %
Lymphocytes Relative: 12 %
Lymphs Abs: 0.8 10*3/uL (ref 0.7–4.0)
MCH: 33.2 pg (ref 26.0–34.0)
MCHC: 32.1 g/dL (ref 30.0–36.0)
MCV: 103.4 fL — ABNORMAL HIGH (ref 80.0–100.0)
Monocytes Absolute: 0.7 10*3/uL (ref 0.1–1.0)
Monocytes Relative: 10 %
Neutro Abs: 4.7 10*3/uL (ref 1.7–7.7)
Neutrophils Relative %: 74 %
Platelets: 125 10*3/uL — ABNORMAL LOW (ref 150–400)
RBC: 2.95 MIL/uL — ABNORMAL LOW (ref 3.87–5.11)
RDW: 12.7 % (ref 11.5–15.5)
WBC: 6.4 10*3/uL (ref 4.0–10.5)
nRBC: 0 % (ref 0.0–0.2)

## 2023-03-05 LAB — COMPREHENSIVE METABOLIC PANEL
ALT: 17 U/L (ref 0–44)
AST: 19 U/L (ref 15–41)
Albumin: 3.2 g/dL — ABNORMAL LOW (ref 3.5–5.0)
Alkaline Phosphatase: 55 U/L (ref 38–126)
Anion gap: 8 (ref 5–15)
BUN: 47 mg/dL — ABNORMAL HIGH (ref 8–23)
CO2: 25 mmol/L (ref 22–32)
Calcium: 8.5 mg/dL — ABNORMAL LOW (ref 8.9–10.3)
Chloride: 106 mmol/L (ref 98–111)
Creatinine, Ser: 2.04 mg/dL — ABNORMAL HIGH (ref 0.44–1.00)
GFR, Estimated: 23 mL/min — ABNORMAL LOW (ref >60)
Glucose, Bld: 104 mg/dL — ABNORMAL HIGH (ref 70–99)
Potassium: 3.6 mmol/L (ref 3.5–5.1)
Sodium: 139 mmol/L (ref 135–145)
Total Bilirubin: 0.6 mg/dL (ref 0.3–1.2)
Total Protein: 5.7 g/dL — ABNORMAL LOW (ref 6.5–8.1)

## 2023-03-05 LAB — MAGNESIUM: Magnesium: 2.2 mg/dL (ref 1.7–2.4)

## 2023-03-05 MED ORDER — ALPRAZOLAM 0.5 MG PO TABS
0.5000 mg | ORAL_TABLET | Freq: Three times a day (TID) | ORAL | Status: DC | PRN
  Administered 2023-03-05: 0.5 mg via ORAL
  Filled 2023-03-05: qty 1

## 2023-03-05 MED ORDER — ISOSORBIDE MONONITRATE ER 60 MG PO TB24
120.0000 mg | ORAL_TABLET | Freq: Every day | ORAL | Status: DC
  Administered 2023-03-05 – 2023-03-06 (×2): 120 mg via ORAL
  Filled 2023-03-05 (×2): qty 2

## 2023-03-05 MED ORDER — SODIUM BICARBONATE 650 MG PO TABS
650.0000 mg | ORAL_TABLET | Freq: Four times a day (QID) | ORAL | Status: DC
  Administered 2023-03-05 – 2023-03-06 (×5): 650 mg via ORAL
  Filled 2023-03-05 (×5): qty 1

## 2023-03-05 MED ORDER — AMLODIPINE BESYLATE 5 MG PO TABS
5.0000 mg | ORAL_TABLET | Freq: Every day | ORAL | Status: DC
  Administered 2023-03-06: 5 mg via ORAL
  Filled 2023-03-05: qty 1

## 2023-03-05 MED ORDER — ACETAMINOPHEN 325 MG PO TABS
650.0000 mg | ORAL_TABLET | Freq: Four times a day (QID) | ORAL | Status: DC
  Administered 2023-03-05 – 2023-03-06 (×4): 650 mg via ORAL
  Filled 2023-03-05 (×5): qty 2

## 2023-03-05 MED ORDER — SODIUM CHLORIDE 0.9 % IV SOLN: INTRAVENOUS | Status: AC

## 2023-03-05 MED ORDER — ACETAMINOPHEN 650 MG RE SUPP
650.0000 mg | Freq: Four times a day (QID) | RECTAL | Status: DC | PRN
Start: 2023-03-05 — End: 2023-03-05

## 2023-03-05 MED ORDER — OXYCODONE HCL 5 MG PO TABS
5.0000 mg | ORAL_TABLET | ORAL | Status: DC | PRN
  Administered 2023-03-05 (×2): 5 mg via ORAL
  Filled 2023-03-05 (×2): qty 1

## 2023-03-05 MED ORDER — ONDANSETRON HCL 4 MG/2ML IJ SOLN: 4.0000 mg | Freq: Four times a day (QID) | INTRAMUSCULAR | Status: DC | PRN

## 2023-03-05 MED ORDER — ONDANSETRON HCL 4 MG PO TABS: 4.0000 mg | ORAL_TABLET | Freq: Four times a day (QID) | ORAL | Status: DC | PRN

## 2023-03-05 MED ORDER — ACETAMINOPHEN 650 MG RE SUPP
650.0000 mg | Freq: Four times a day (QID) | RECTAL | Status: DC
  Filled 2023-03-05: qty 1

## 2023-03-05 MED ORDER — HEPARIN SODIUM (PORCINE) 5000 UNIT/ML IJ SOLN
5000.0000 [IU] | Freq: Three times a day (TID) | INTRAMUSCULAR | Status: DC
  Administered 2023-03-05 – 2023-03-06 (×5): 5000 [IU] via SUBCUTANEOUS
  Filled 2023-03-05 (×5): qty 1

## 2023-03-05 MED ORDER — AMLODIPINE BESYLATE 5 MG PO TABS
10.0000 mg | ORAL_TABLET | Freq: Every day | ORAL | Status: DC
  Administered 2023-03-05: 10 mg via ORAL
  Filled 2023-03-05 (×2): qty 2

## 2023-03-05 MED ORDER — ACETAMINOPHEN 325 MG PO TABS
650.0000 mg | ORAL_TABLET | Freq: Four times a day (QID) | ORAL | Status: DC | PRN
Start: 2023-03-05 — End: 2023-03-05

## 2023-03-05 MED ORDER — FENTANYL CITRATE PF 50 MCG/ML IJ SOSY: 12.5000 ug | PREFILLED_SYRINGE | INTRAMUSCULAR | Status: DC | PRN

## 2023-03-05 MED ORDER — ROSUVASTATIN CALCIUM 10 MG PO TABS
5.0000 mg | ORAL_TABLET | Freq: Every day | ORAL | Status: DC
  Administered 2023-03-05 – 2023-03-06 (×2): 5 mg via ORAL
  Filled 2023-03-05 (×2): qty 1

## 2023-03-05 NOTE — Assessment & Plan Note (Addendum)
-   Analgesia as needed - Incentive spirometer - No fractures on imaging

## 2023-03-05 NOTE — Progress Notes (Signed)
   03/05/23 1758  TOC Brief Assessment  Insurance and Status Reviewed  Patient has primary care physician Yes  Home environment has been reviewed From home with Spouse who she cares for  Prior level of function: Independent  Prior/Current Home Services No current home services  Social Determinants of Health Reivew SDOH reviewed no interventions necessary  Readmission risk has been reviewed Yes  Transition of care needs transition of care needs identified, TOC will continue to follow (PT receommended SNF, pt declines, she cares for spouse who has cancer and near blind per pt. Open to HHPT instead.)   CSW visited pt at bedside for Obs and to discuss PT recommendation.  Pt declines and shared that she cares for her husband who is very ill and is nearly blind.  Pt plan is to return home to care for spouse, refusing SNF.  She is willing to accept HHPT referral. PT therapist stopped CSW in hall today and also stated that recommends this pt receive a platform walker.   TOC to follow.

## 2023-03-05 NOTE — Assessment & Plan Note (Signed)
Continue Imdur, Crestor

## 2023-03-05 NOTE — Assessment & Plan Note (Signed)
-   Continue Xanax 

## 2023-03-05 NOTE — Care Management Obs Status (Signed)
MEDICARE OBSERVATION STATUS NOTIFICATION   Patient Details  Name: [name] MRN: [id number] Date of Birth: 10/06/1933   Medicare Observation Status Notification Given:  Yes    Jaicion Laurie Marsh Dolly, LCSW 03/05/2023, 5:47 PM

## 2023-03-05 NOTE — H&P (Signed)
History and Physical    PatientAIMME Love ZOX:[id number] DOB: 04-Aug-1933 DOA: 03/04/2023 DOS: the patient was seen and examined on 03/05/2023 PCP: Assunta Found, MD  Patient coming from: Home  Chief Complaint:  Chief Complaint  Patient presents with   Fall   HPI: [name] is a 89 y.o. female with medical history significant of anxiety, coronary artery disease, hyperlipidemia, hypertension, renal insufficiency, and more presents the ED with a chief complaint of fall.  Patient reports that she was walking and her foot caught on a door jam and she fell onto her left side.  She reports she could not get up on her own.  She did not pass out.  She did not hit her head.  She is not on anticoagulation.  She reports that she had no preceding chest pain, shortness of breath, palpitations, dizziness.  She did have nausea after she fell.  She reports it hurts to put weight on either leg.  She is not able to stand up.  She had been in her normal state of health until this happened.  Her only complaint on review of systems is pressure where her uterus used to be.  She reports has been going on for 6 or 7 months.  She was given an antibiotic once and it helped temporarily, but now the pain is back.  She reports she is almost never incontinent.  There was 1 time where she felt like she had to strain to get the flow of urine started, but that is not a regular occurrence for her.  She denies any fever.  She denies any change in the color or frequency of her urine.  Patient has no other complaints at this time.  For contact she did have a fall 3 or 4 months ago.  At that time she hurt her right knee and she has been walking with a walker or cane ever since.  Patient does not smoke, does not drink.  Patient does not wear oxygen at home however she has required oxygen after having pain medication and falling asleep here.  Patient is full code. Review of Systems: As mentioned in the history of present  illness. All other systems reviewed and are negative. Past Medical History:  Diagnosis Date   Anemia    Anxiety    Arthritis    Bleeding from the nose Oct 2014   CAD (coronary artery disease)    a. s/p rotational atherectomy and DES placement to LAD in 2012 b. low-risk NST in 08/2018   Cataract of both eyes    Chronic kidney disease    Colon polyps    Hyperlipidemia    Hypertension    x 5-6 yrs.   LBBB (left bundle branch block)    Renal insufficiency    Past Surgical History:  Procedure Laterality Date   BREAST SURGERY     masses removed while she was pregnant   CARDIAC CATHETERIZATION  06/20/2008   severe single vessel high-grade stenosis of mid LAD at bifurcation of diagonal 1 and setpal perforator 1; diffuse coronary calcification of L coronary system; 50% stenosis of mid RCA (Dr. Evlyn Courier)   CATARACT EXTRACTION W/PHACO Right 10/10/2013   Procedure: CATARACT EXTRACTION PHACO AND INTRAOCULAR LENS PLACEMENT (IOC);  Surgeon: Gemma Payor, MD;  Location: [facility];  Service: Ophthalmology;  Laterality: Right;  CDE:11.96   CATARACT EXTRACTION W/PHACO Left 11/18/2013   Procedure: CATARACT EXTRACTION PHACO AND INTRAOCULAR LENS PLACEMENT (IOC);  Surgeon: Gemma Payor, MD;  Location: [facility];  Service: Ophthalmology;  Laterality: Left;  CDE 15.96   COLONOSCOPY N/A 01/16/2013   Procedure: COLONOSCOPY;  Surgeon: Malissa Hippo, MD; igmoid colon diverticulosis as well as changes of diverticulitis involving one diverticulum. She also had external hemorrhoids.   CORONARY ANGIOPLASTY WITH STENT PLACEMENT  11/08/2010   complex high-speed rotational atherectomy of calcified LAD, diffusely disease LAD system (Dr. Bishop Limbo)   EXCISION ORAL TUMOR Right 01/14/2014   Procedure: EXCISION OROPHARYNGEAL MASS;  Surgeon: Darletta Moll, MD;  Location: [facility];  Service: ENT;  Laterality: Right;   NM MYOCAR PERF WALL MOTION  05/10/2011   lexiscan myoview; normal pattern of perfusion in all regions;  post-stress EF 55%; low risk scan    PARTIAL HYSTERECTOMY     TONSILLECTOMY     TRANSTHORACIC ECHOCARDIOGRAM  10/08/2010   EF 45-50%, mod conc LVH, grade 1 diastolic dysfunction, mildly calcified left AV cusp; calcified MV annulus   Social History:  reports that she has never smoked. She has never used smokeless tobacco. She reports that she does not drink alcohol and does not use drugs.  Allergies  Allergen Reactions   Macrobid [Nitrofurantoin] Nausea And Vomiting    Family History  Problem Relation Age of Onset   Liver cancer Mother    Cancer Father    Heart Problems Brother    Coronary artery disease Sister        stent   Heart Problems Daughter        also stomach problems   COPD Daughter    Colon cancer Neg Hx     Prior to Admission medications   Medication Sig Start Date End Date Taking? Authorizing Provider  ALPRAZolam Prudy Feeler) 0.5 MG tablet Take 0.5 mg by mouth 3 (three) times daily as needed. 06/05/20   [provider]  amLODipine (NORVASC) 10 MG tablet Take 1 tablet by mouth once daily 06/07/21   Antoine Poche, MD  Cyanocobalamin 1000 MCG/ML KIT     [provider]  estradiol (ESTRACE) 0.1 MG/GM vaginal cream 1 gram at bedtime every other night 02/21/23   Lazaro Arms, MD  FLAXSEED, LINSEED, PO Take 1 capsule by mouth daily.     [provider]  furosemide (LASIX) 40 MG tablet Take 40 mg by mouth as needed.  12/25/17   [provider]  isosorbide mononitrate (IMDUR) 120 MG 24 hr tablet Take 1 tablet by mouth once daily 11/14/22   Antoine Poche, MD  metoprolol succinate (TOPROL-XL) 50 MG 24 hr tablet Take 1 tablet (50 mg total) by mouth daily. Patient not taking: Reported on 12/21/2022 06/27/22   Antoine Poche, MD  nitroGLYCERIN (NITROSTAT) 0.4 MG SL tablet Place 1 tablet (0.4 mg total) under the tongue every 5 (five) minutes as needed for chest pain. 08/01/22   Antoine Poche, MD  rosuvastatin (CRESTOR) 5 MG tablet Take 1  tablet (5 mg total) by mouth daily. 02/25/20   Antoine Poche, MD  sodium bicarbonate 650 MG tablet Take 650 mg by mouth 4 (four) times daily.    [provider]    Physical Exam: Vitals:   03/05/23 0115 03/05/23 0200 03/05/23 0300 03/05/23 0400  BP: (!) 150/71 139/70 (!) 145/68 (!) 140/69  Pulse: 96 90 82 92  Resp: 14 15 14 19   Temp:    100.1 F (37.8 C)  TempSrc:    Oral  SpO2: 97% 95% 96% 97%  Weight:  Height:       1.  General: Patient lying supine in bed,  no acute distress   2. Psychiatric: Alert and oriented x 3, mood and behavior normal for situation, pleasant and cooperative with exam   3. Neurologic: Speech and language are normal, face is symmetric, moves all 4 extremities voluntarily, at baseline without acute deficits on limited exam   4. HEENMT:  Head is atraumatic, normocephalic, pupils reactive to light, neck is supple, trachea is midline, mucous membranes are moist   5. Respiratory : Lungs are clear to auscultation bilaterally without wheezing, rhonchi, rales, no cyanosis, no increase in work of breathing or accessory muscle use   6. Cardiovascular : Heart rate normal, rhythm is regular, no murmurs, rubs or gallops, no peripheral edema, peripheral pulses palpated   7. Gastrointestinal:  Abdomen is soft, nondistended, nontender to palpation bowel sounds active, no masses or organomegaly palpated   8. Skin:  Skin is warm, dry and intact without rashes, acute lesions, or ulcers on limited exam   9.Musculoskeletal:  No acute deformities or trauma, no asymmetry in tone, no peripheral edema, peripheral pulses palpated, no tenderness to palpation in the extremities  Data Reviewed: In the ED Afebrile, tachycardic with pain initially, blood pressure was hypertensive with pain initially, maintaining oxygen sats No leukocytosis, hemoglobin stable Creatinine is 2.30 today but the last time it was taken it was 2.2, so very close to baseline.  That  was April 2024 CT pelvis shows fractures as described in assessment and plan CT head shows no intracranial abnormality Left forearm x-ray shows comminuted impaction fracture of distal radius with intra-articular extension and mild residual dorsal displacement as well as an ulnar styloid fracture This was reduced and splinted Ortho was consulted for hand-they recommend follow-up outpatient Ortho was also consulted for pelvic fractures and recommended pain control and PT Admission requested for fall   Assessment and Plan: * Fall - Mechanical fall - Pelvis fracture, Colles' fracture, sacral fracture - PT eval and treat - Analgesia as needed - No preceding symptoms - Continue to monitor  Wrist fracture, closed - Colles' fracture - Reduced and splinted in the ED - Continue analgesia as needed - Follow-up with Ortho outpatient  Anxiety - Continue Xanax  Rib pain - Analgesia as needed - Incentive spirometer - No fractures on imaging  Pelvis fracture (HCC) - CT shows nondisplaced parasymphyseal left superior and inferior pubic rami fracture.  Suspected nondisplaced left sacral alar fracture as well - Analgesia as needed - PT eval and treat - Patient may need to go to rehab - Ortho consulted and recommended pain control and PT  Hypertensive heart disease - Continue amlodipine  CAD (coronary artery disease) - Continue Imdur, Crestor      Advance Care Planning:   Code Status: Full Code  Consults: Ortho  Family Communication: No family at bedside  Severity of Illness: The appropriate patient status for this patient is OBSERVATION. Observation status is judged to be reasonable and necessary in order to provide the required intensity of service to ensure the patient's safety. The patient's presenting symptoms, physical exam findings, and initial radiographic and laboratory data in the context of their medical condition is felt to place them at decreased risk for further  clinical deterioration. Furthermore, it is anticipated that the patient will be medically stable for discharge from the hospital within 2 midnights of admission.   Author: Lilyan Gilford, DO 03/05/2023 5:44 AM  For on call review [email or web address].

## 2023-03-05 NOTE — Assessment & Plan Note (Signed)
-   Colles' fracture - Reduced and splinted in the ED - Continue analgesia as needed - Follow-up with Ortho outpatient

## 2023-03-05 NOTE — Assessment & Plan Note (Signed)
-   Continue amlodipine ?

## 2023-03-05 NOTE — Assessment & Plan Note (Addendum)
-   CT shows nondisplaced parasymphyseal left superior and inferior pubic rami fracture.  Suspected nondisplaced left sacral alar fracture as well - Analgesia as needed - PT eval and treat - Patient may need to go to rehab - Ortho consulted and recommended pain control and PT

## 2023-03-05 NOTE — ED Notes (Signed)
Some swelling noted in fingers of left hand. Pt's left arm elevated and ice packs placed on bottom and top of arm.

## 2023-03-05 NOTE — Evaluation (Signed)
Physical Therapy Evaluation Patient Details Name: [name] MRN: [id number] DOB: [birth year] Today's Date: 03/05/2023  History of Present Illness  [name] is a 89 y.o. female with medical history significant of anxiety, coronary artery disease, hyperlipidemia, hypertension, renal insufficiency, and more presents the ED with a chief complaint of fall.  Patient reports that she was walking and her foot caught on a door jam and she fell onto her left side.  She reports she could not get up on her own.  She did not pass out.  She did not hit her head.  She is not on anticoagulation.  She reports that she had no preceding chest pain, shortness of breath, palpitations, dizziness.  She did have nausea after she fell.  She reports it hurts to put weight on either leg.  She is not able to stand up.  She had been in her normal state of health until this happened.  Her only complaint on review of systems is pressure where her uterus used to be.  She reports has been going on for 6 or 7 months.  She was given an antibiotic once and it helped temporarily, but now the pain is back.  She reports she is almost never incontinent.  There was 1 time where she felt like she had to strain to get the flow of urine started, but that is not a regular occurrence for her.  She denies any fever.  She denies any change in the color or frequency of her urine.  Patient has no other complaints at this time.     For contact she did have a fall 3 or 4 months ago.  At that time she hurt her right knee and she has been walking with a walker or cane ever since.     Patient does not smoke, does not drink.  Patient does not wear oxygen at home however she has required oxygen after having pain medication and falling asleep here.  Patient is full code.    Clinical Impression  Patient lying in bed on therapist arrival.  She is pleasant and cooperative with therapist assessment.  States she was in "a lot of pain; 10/10" but since taking  pain medication she is better 4/10 pain at rest.  PT assists patient with sitting up on the side of the bed; she needs moderate assistance for her legs and trunk due to core weakness; leg pain and inability to use left arm to assist up.  Once sitting, patient needs min A to maintain sitting balance; she reports feeling light headed while sitting up so we do not attempt transfer.  Patient needs moderate assist for her trunk and legs to return to supine and max assist to scoot up in bed.  patient left in bed with call button in reach and nursing notified of mobility status. Patient will benefit from continued skilled therapy services during the remainder of her hospital stay and at the next recommended venue of care to address deficits and promote return to optimal function.           If plan is discharge home, recommend the following: A lot of help with bathing/dressing/bathroom;A lot of help with walking and/or transfers;Help with stairs or ramp for entrance;Assistance with cooking/housework   Can travel by private vehicle   No    Equipment Recommendations Other (comment) (rolling walker with platform attatchment for  left arm)  Recommendations for Other Services  OT consult    Functional Status Assessment  Patient has had a recent decline in their functional status and demonstrates the ability to make significant improvements in function in a reasonable and predictable amount of time.     Precautions / Restrictions Precautions Precautions: Fall Precaution Comments: uses a walker or cane at baseline Restrictions Weight Bearing Restrictions: Yes LUE Weight Bearing: Non weight bearing      Mobility  Bed Mobility Overal bed mobility: Needs Assistance Bed Mobility: Supine to Sit, Sit to Supine     Supine to sit: Mod assist Sit to supine: Mod assist   General bed mobility comments: unable to use left arm to assist.  patient needs moderate assistance for legs and to bring trunk upright  with supine to sit.  needs moderate assist with legs for sit to supine and max assist to scoot up in bed    Transfers                   General transfer comment: did not attempt transfer due to patient reports of lightheadedness    Ambulation/Gait                  Stairs            Wheelchair Mobility     Tilt Bed    Modified Rankin (Stroke Patients Only)       Balance Overall balance assessment: Needs assistance Sitting-balance support: Single extremity supported, Feet unsupported (needs minimal assistance to maintain balance sitting on edge of the bed)                                         Pertinent Vitals/Pain Pain Assessment Pain Assessment: 0-10 Pain Score: 4  Pain Location: legs and left arm (was 10/10 before medication) Pain Descriptors / Indicators: Discomfort, Guarding, Sharp, Sore, Stabbing, Throbbing, Tightness Pain Intervention(s): Limited activity within patient's tolerance, Monitored during session, Repositioned    Home Living Family/patient expects to be discharged to:: Private residence Living Arrangements: Spouse/significant other (spouse is "dying of cancer"; she is his primary caregiver. Has two daughters locally who work) Available Help at Discharge: Family;Available PRN/intermittently Type of Home: House Home Access: Stairs to enter Entrance Stairs-Rails: Left Entrance Stairs-Number of Steps: 2   Home Layout: One level Home Equipment: Agricultural consultant (2 wheels);Rollator (4 wheels);BSC/3in1;Grab bars - toilet;Grab bars - tub/shower;Wheelchair - manual      Prior Function Prior Level of Function : Independent/Modified Independent                     Extremity/Trunk Assessment   Upper Extremity Assessment Upper Extremity Assessment: Right hand dominant;LUE deficits/detail LUE Deficits / Details: left colles fracture; left lower arm splinted/wrapped with ace bandage.  noted swelling left hand     Lower Extremity Assessment Lower Extremity Assessment: Generalized weakness (pain with movement)    Cervical / Trunk Assessment Cervical / Trunk Assessment: Kyphotic  Communication   Communication Communication: No apparent difficulties  Cognition Arousal: Alert Behavior During Therapy: WFL for tasks assessed/performed Overall Cognitive Status: Within Functional Limits for tasks assessed                                 General Comments: patient reports she is lightheaded from medication but is pleasant and cooperative        General Comments  Exercises     Assessment/Plan    PT Assessment Patient needs continued PT services  PT Problem List Decreased strength;Decreased range of motion;Decreased activity tolerance;Decreased balance;Decreased mobility;Pain       PT Treatment Interventions Gait training;Functional mobility training;Therapeutic activities;Therapeutic exercise;Balance training;Patient/family education    PT Goals (Current goals can be found in the Care Plan section)  Acute Rehab PT Goals Patient Stated Goal: return home PT Goal Formulation: With patient Time For Goal Achievement: 03/19/23 Potential to Achieve Goals: Good    Frequency Min 3X/week     Co-evaluation               AM-PAC PT "6 Clicks" Mobility  Outcome Measure Help needed turning from your back to your side while in a flat bed without using bedrails?: A Lot Help needed moving from lying on your back to sitting on the side of a flat bed without using bedrails?: A Lot Help needed moving to and from a bed to a chair (including a wheelchair)?: A Lot Help needed standing up from a chair using your arms (e.g., wheelchair or bedside chair)?: A Lot Help needed to walk in hospital room?: A Lot Help needed climbing 3-5 steps with a railing? : Total 6 Click Score: 11    End of Session Equipment Utilized During Treatment: Oxygen Activity Tolerance: Patient limited by  pain;Other (comment) (light headed) Patient left: in bed;with call bell/phone within reach Nurse Communication: Mobility status PT Visit Diagnosis: Unsteadiness on feet (R26.81);Muscle weakness (generalized) (M62.81);History of falling (Z91.81);Other abnormalities of gait and mobility (R26.89)    Time: 0347-4259 PT Time Calculation (min) (ACUTE ONLY): 22 min   Charges:   PT Evaluation $PT Eval Low Complexity: 1 Low   PT General Charges $$ ACUTE PT VISIT: 1 Visit         11:41 AM, 03/05/23 Jaquann Guarisco Small Galileah Piggee MPT [facility] [place] [phone] Ph:[phone]

## 2023-03-05 NOTE — Assessment & Plan Note (Signed)
-   Mechanical fall - Pelvis fracture, Colles' fracture, sacral fracture - PT eval and treat - Analgesia as needed - No preceding symptoms - Continue to monitor

## 2023-03-05 NOTE — Progress Notes (Signed)
ASSUMPTION OF CARE NOTE   03/05/2023 10:22 AM  [name] was seen and examined.  The H&P by the admitting provider, orders, imaging was reviewed.  Please see new orders.  Will continue to follow.   Vitals:   03/05/23 0715 03/05/23 0808  BP:    Pulse: 93   Resp: 16   Temp:  99.6 F (37.6 C)  SpO2: 96%     Results for orders placed or performed during the hospital encounter of 03/04/23  CBC  Result Value Ref Range   WBC 8.5 4.0 - 10.5 K/uL   RBC 3.08 (L) 3.87 - 5.11 MIL/uL   Hemoglobin 10.2 (L) 12.0 - 15.0 g/dL   HCT 16.1 (L) 09.6 - 04.5 %   MCV 101.9 (H) 80.0 - 100.0 fL   MCH 33.1 26.0 - 34.0 pg   MCHC 32.5 30.0 - 36.0 g/dL   RDW 40.9 81.1 - 91.4 %   Platelets 147 (L) 150 - 400 K/uL   nRBC 0.0 0.0 - 0.2 %  Basic metabolic panel  Result Value Ref Range   Sodium 139 135 - 145 mmol/L   Potassium 3.6 3.5 - 5.1 mmol/L   Chloride 104 98 - 111 mmol/L   CO2 24 22 - 32 mmol/L   Glucose, Bld 113 (H) 70 - 99 mg/dL   BUN 54 (H) 8 - 23 mg/dL   Creatinine, Ser 7.82 (H) 0.44 - 1.00 mg/dL   Calcium 8.7 (L) 8.9 - 10.3 mg/dL   GFR, Estimated 20 (L) >60 mL/min   Anion gap 11 5 - 15  Comprehensive metabolic panel  Result Value Ref Range   Sodium 139 135 - 145 mmol/L   Potassium 3.6 3.5 - 5.1 mmol/L   Chloride 106 98 - 111 mmol/L   CO2 25 22 - 32 mmol/L   Glucose, Bld 104 (H) 70 - 99 mg/dL   BUN 47 (H) 8 - 23 mg/dL   Creatinine, Ser 9.56 (H) 0.44 - 1.00 mg/dL   Calcium 8.5 (L) 8.9 - 10.3 mg/dL   Total Protein 5.7 (L) 6.5 - 8.1 g/dL   Albumin 3.2 (L) 3.5 - 5.0 g/dL   AST 19 15 - 41 U/L   ALT 17 0 - 44 U/L   Alkaline Phosphatase 55 38 - 126 U/L   Total Bilirubin 0.6 0.3 - 1.2 mg/dL   GFR, Estimated 23 (L) >60 mL/min   Anion gap 8 5 - 15  Magnesium  Result Value Ref Range   Magnesium 2.2 1.7 - 2.4 mg/dL  CBC with Differential/Platelet  Result Value Ref Range   WBC 6.4 4.0 - 10.5 K/uL   RBC 2.95 (L) 3.87 - 5.11 MIL/uL   Hemoglobin 9.8 (L) 12.0 - 15.0 g/dL   HCT 21.3 (L)  08.6 - 46.0 %   MCV 103.4 (H) 80.0 - 100.0 fL   MCH 33.2 26.0 - 34.0 pg   MCHC 32.1 30.0 - 36.0 g/dL   RDW 57.8 46.9 - 62.9 %   Platelets 125 (L) 150 - 400 K/uL   nRBC 0.0 0.0 - 0.2 %   Neutrophils Relative % 74 %   Neutro Abs 4.7 1.7 - 7.7 K/uL   Lymphocytes Relative 12 %   Lymphs Abs 0.8 0.7 - 4.0 K/uL   Monocytes Relative 10 %   Monocytes Absolute 0.7 0.1 - 1.0 K/uL   Eosinophils Relative 4 %   Eosinophils Absolute 0.2 0.0 - 0.5 K/uL   Basophils Relative 0 %   Basophils  Absolute 0.0 0.0 - 0.1 K/uL   Immature Granulocytes 0 %   Abs Immature Granulocytes 0.02 0.00 - 0.07 K/uL   C. Laural Benes, MD [facility]   03/04/2023  4:40 PM How to contact the [facility] Attending or Consulting provider 7A - 7P or covering provider during after hours 7P -7A, for this patient?  Check the care team in [facility] and look for a) attending/consulting [facility] provider listed and b) the [facility] team listed Log into [email or web address] and use [place] universal password to access. If you do not have the password, please contact the hospital operator. Locate the [facility] provider you are looking for under [facility] and page to a number that you can be directly reached. If you still have difficulty reaching the provider, please page the [facility] (Director on Call) for the Hospitalists listed on amion for assistance.

## 2023-03-06 DIAGNOSIS — W19XXXD Unspecified fall, subsequent encounter: Secondary | ICD-10-CM | POA: Diagnosis not present

## 2023-03-06 DIAGNOSIS — S62102D Fracture of unspecified carpal bone, left wrist, subsequent encounter for fracture with routine healing: Secondary | ICD-10-CM

## 2023-03-06 DIAGNOSIS — S329XXA Fracture of unspecified parts of lumbosacral spine and pelvis, initial encounter for closed fracture: Secondary | ICD-10-CM | POA: Diagnosis not present

## 2023-03-06 MED ORDER — ACETAMINOPHEN 325 MG PO TABS: 650.0000 mg | ORAL_TABLET | Freq: Three times a day (TID) | ORAL | Status: AC

## 2023-03-06 MED ORDER — ISOSORBIDE MONONITRATE ER 60 MG PO TB24: 60.0000 mg | ORAL_TABLET | Freq: Two times a day (BID) | ORAL | Status: DC

## 2023-03-06 MED ORDER — OXYCODONE HCL 5 MG PO TABS: 5.0000 mg | ORAL_TABLET | Freq: Four times a day (QID) | ORAL | 0 refills | Status: AC | PRN

## 2023-03-06 MED ORDER — ROSUVASTATIN CALCIUM 5 MG PO TABS: 5.0000 mg | ORAL_TABLET | Freq: Every day | ORAL | Status: AC

## 2023-03-06 MED ORDER — AMLODIPINE BESYLATE 10 MG PO TABS: 5.0000 mg | ORAL_TABLET | Freq: Every day | ORAL | Status: AC

## 2023-03-06 NOTE — Discharge Summary (Signed)
Physician Discharge Summary  [name] ZOX:[id number] DOB: April 14, 1934 DOA: 03/04/2023  PCP: Assunta Found, MD Ortho: Dr. Mack Hook   Admit date: 03/04/2023 Discharge date: 03/06/2023  Admitted From:  Home  Disposition:  Home with HH (declines SNF rehab)  Recommendations for Outpatient Follow-up:  Follow up with Dr. Mack Hook with orthopedics as scheduled Follow up with PCP in 1-2 weeks Follow with cardiology as scheduled  Home Health: PT   Discharge Condition: STABLE   CODE STATUS: FULL DIET: resume heart healthy   Brief Hospitalization Summary: Please see all hospital notes, images, labs for full details of the hospitalization. Admission provider HPI:  89 y.o. female with medical history significant of anxiety, coronary artery disease, hyperlipidemia, hypertension, renal insufficiency, and more presents the ED with a chief complaint of fall.  Patient reports that she was walking and her foot caught on a door jam and she fell onto her left side.  She reports she could not get up on her own.  She did not pass out.  She did not hit her head.  She is not on anticoagulation.  She reports that she had no preceding chest pain, shortness of breath, palpitations, dizziness.  She did have nausea after she fell.  She reports it hurts to put weight on either leg.  She is not able to stand up.  She had been in her normal state of health until this happened.  Her only complaint on review of systems is pressure where her uterus used to be.  She reports has been going on for 6 or 7 months.  She was given an antibiotic once and it helped temporarily, but now the pain is back.  She reports she is almost never incontinent.  There was 1 time where she felt like she had to strain to get the flow of urine started, but that is not a regular occurrence for her.  She denies any fever.  She denies any change in the color or frequency of her urine.  Patient has no other complaints at this time.   For  contact she did have a fall 3 or 4 months ago.  At that time she hurt her right knee and she has been walking with a walker or cane ever since.   Patient does not smoke, does not drink.  Patient does not wear oxygen at home however she has required oxygen after having pain medication and falling asleep here.  Patient is full code.  Hospital Course   Pt was admitted for observation for uncontrolled pain from a pelvis fracture and closed left wrist fracture, orthopedics had recommended close outpatient follow-up.  She was admitted for pain control.  She was also admitted for PT evaluation.  Patient suffered a mechanical fall at home.  The patient was treated with analgesics and was seen by PT who had recommended SNF rehab.  Patient declined and decided to go home with home health services.  She is concerned about her husband who is alone at home who she is the primary caretaker for.  She feels that her pain is manageable now and she has been ambulating.  Home health PT has been ordered and orthopedist Dr. Janalyn Harder office will be contacting her to make outpatient follow-up appointments regarding her wrist fracture and pelvis fracture.  Patient is discharged in stable condition.  Discharge Diagnoses:  Principal Problem:   Fall Active Problems:   CAD (coronary artery disease)   Hypertensive heart disease   Pelvis fracture (  HCC)   Rib pain   Anxiety   Wrist fracture, closed   Discharge Instructions:  Allergies as of 03/06/2023       Reactions   Macrobid [nitrofurantoin] Nausea And Vomiting        Medication List     TAKE these medications    acetaminophen 325 MG tablet Commonly known as: TYLENOL Take 2 tablets (650 mg total) by mouth in the morning, at noon, and at bedtime. What changed:  medication strength how much to take when to take this reasons to take this   ALPRAZolam 0.5 MG tablet Commonly known as: XANAX Take 0.5 mg by mouth See admin instructions. Take 0.5  mg (1 tablet) nightly at bedtime. Takes an additional 0.5 mg during the day if needed for anxiety.   amLODipine 10 MG tablet Commonly known as: NORVASC Take 0.5 tablets (5 mg total) by mouth daily in the afternoon.   isosorbide mononitrate 60 MG 24 hr tablet Commonly known as: IMDUR Take 1 tablet (60 mg total) by mouth 2 (two) times daily. Start taking on: March 07, 2023 What changed:  medication strength how much to take when to take this   nitroGLYCERIN 0.4 MG SL tablet Commonly known as: Nitrostat Place 1 tablet (0.4 mg total) under the tongue every 5 (five) minutes as needed for chest pain.   One A Day Women 50 Plus Tabs Take 1 tablet by mouth daily.   oxyCODONE 5 MG immediate release tablet Commonly known as: Oxy IR/ROXICODONE Take 1 tablet (5 mg total) by mouth every 6 (six) hours as needed for up to 5 days for severe pain (pain score 7-10).   rosuvastatin 5 MG tablet Commonly known as: CRESTOR Take 1 tablet (5 mg total) by mouth at bedtime.   sodium bicarbonate 650 MG tablet Take 325 mg by mouth 3 (three) times daily.        Follow-up Information     Mack Hook, MD Follow up.   Specialty: Orthopedic Surgery Why: My office will contact you on Monday to arrange for continued care for your broken wrist Contact information: [address] [place] [address] [phone]         Care, [facility] Follow up.   Specialty: Home Health Services Why: PT will call to schedule your first home visit. Contact information: [address] [place] [address] [phone]                Allergies  Allergen Reactions   Macrobid [Nitrofurantoin] Nausea And Vomiting   Allergies as of 03/06/2023       Reactions   Macrobid [nitrofurantoin] Nausea And Vomiting        Medication List     TAKE these medications    acetaminophen 325 MG tablet Commonly known as: TYLENOL Take 2 tablets (650 mg total) by mouth in the morning,  at noon, and at bedtime. What changed:  medication strength how much to take when to take this reasons to take this   ALPRAZolam 0.5 MG tablet Commonly known as: XANAX Take 0.5 mg by mouth See admin instructions. Take 0.5 mg (1 tablet) nightly at bedtime. Takes an additional 0.5 mg during the day if needed for anxiety.   amLODipine 10 MG tablet Commonly known as: NORVASC Take 0.5 tablets (5 mg total) by mouth daily in the afternoon.   isosorbide mononitrate 60 MG 24 hr tablet Commonly known as: IMDUR Take 1 tablet (60 mg total) by mouth 2 (two) times  daily. Start taking on: March 07, 2023 What changed:  medication strength how much to take when to take this   nitroGLYCERIN 0.4 MG SL tablet Commonly known as: Nitrostat Place 1 tablet (0.4 mg total) under the tongue every 5 (five) minutes as needed for chest pain.   One A Day Women 50 Plus Tabs Take 1 tablet by mouth daily.   oxyCODONE 5 MG immediate release tablet Commonly known as: Oxy IR/ROXICODONE Take 1 tablet (5 mg total) by mouth every 6 (six) hours as needed for up to 5 days for severe pain (pain score 7-10).   rosuvastatin 5 MG tablet Commonly known as: CRESTOR Take 1 tablet (5 mg total) by mouth at bedtime.   sodium bicarbonate 650 MG tablet Take 325 mg by mouth 3 (three) times daily.       Procedures/Studies: DG Forearm Left  Result Date: 03/04/2023 CLINICAL DATA:  Postreduction/splinting. EXAM: LEFT FOREARM - 2 VIEW COMPARISON:  03/04/2023. FINDINGS: Examination is limited due to overlying casting material. There is redemonstration of a comminuted impaction fracture of the distal radius with intra-articular extension with persistent mild dorsal displacement of the distal fracture fragment. An ulnar styloid fracture is unchanged. Soft tissue swelling is present about the wrist. IMPRESSION: 1. Comminuted impaction fracture of the distal radius with intra-articular extension and mild residual dorsal  displacement. 2. Ulnar styloid fracture. Electronically Signed   By: Thornell Sartorius M.D.   On: 03/04/2023 23:43   CT PELVIS WO CONTRAST  Result Date: 03/04/2023 CLINICAL DATA:  Larey Seat, abnormal pelvic x-ray EXAM: CT PELVIS WITHOUT CONTRAST TECHNIQUE: Multidetector CT imaging of the pelvis was performed following the standard protocol without intravenous contrast. RADIATION DOSE REDUCTION: This exam was performed according to the departmental dose-optimization program which includes automated exposure control, adjustment of the mA and/or kV according to patient size and/or use of iterative reconstruction technique. COMPARISON:  03/04/2023, 12/19/2016 FINDINGS: Urinary Tract: Distal ureters and bladder are unremarkable. No urinary tract calculi. Bowel: No bowel obstruction or ileus. Normal appendix right lower quadrant. Diffuse diverticulosis throughout the distal colon, with no evidence of acute diverticulitis. No bowel wall thickening or inflammatory change. Vascular/Lymphatic: Atherosclerosis of the aorta and iliac vessels. No pathologic adenopathy. Reproductive:  Prior hysterectomy.  No adnexal masses. Other: No free fluid or free intraperitoneal gas. No abdominal wall hernia. Musculoskeletal: Nondisplaced fractures are seen through the left superior and inferior pubic rami in a parasymphyseal location, corresponding to the cortical irregularity on recent x-ray. There is also subtle cortical irregularity involving the anterior margin of the left sacral ala inferiorly, consistent with nondisplaced sacral ale are fracture. No other acute bony abnormalities.  Hips are well aligned. Reconstructed images demonstrate no additional findings. IMPRESSION: 1. Nondisplaced parasymphyseal left superior and inferior pubic rami fractures, corresponding to the cortical irregularity seen on recent x-ray. 2. Suspected nondisplaced left sacral alar fracture as above. 3.  Aortic Atherosclerosis (ICD10-I70.0). 4. Distal colonic  diverticulosis without diverticulitis. Electronically Signed   By: Sharlet Salina M.D.   On: 03/04/2023 20:40   CT Head Wo Contrast  Result Date: 03/04/2023 CLINICAL DATA:  Larey Seat on left arm/wrist with deformity and swelling noted to the area. Minor head trauma. EXAM: CT HEAD WITHOUT CONTRAST TECHNIQUE: Contiguous axial images were obtained from the base of the skull through the vertex without intravenous contrast. RADIATION DOSE REDUCTION: This exam was performed according to the departmental dose-optimization program which includes automated exposure control, adjustment of the mA and/or kV according to patient size and/or use of  iterative reconstruction technique. COMPARISON:  09/19/2022 FINDINGS: Brain: No intracranial hemorrhage, mass effect, or evidence of acute infarct. No hydrocephalus. No extra-axial fluid collection. Age-commensurate cerebral atrophy and chronic small vessel ischemic disease. Chronic bilateral cerebellar infarcts. Vascular: No hyperdense vessel. Intracranial arterial calcification. Skull: No fracture or focal lesion. Sinuses/Orbits: No acute finding. Other: None. IMPRESSION: No acute intracranial abnormality. Electronically Signed   By: Minerva Fester M.D.   On: 03/04/2023 19:06   DG Chest 1 View  Result Date: 03/04/2023 CLINICAL DATA:  fall EXAM: CHEST  1 VIEW COMPARISON:  November 24, 2020 FINDINGS: The cardiomediastinal silhouette is unchanged and enlarged in contour.Atherosclerotic calcifications. No pleural effusion. No pneumothorax. No acute pleuroparenchymal abnormality. IMPRESSION: No acute cardiopulmonary abnormality. Electronically Signed   By: Meda Klinefelter M.D.   On: 03/04/2023 19:06   DG Pelvis 1-2 Views  Result Date: 03/04/2023 CLINICAL DATA:  fall EXAM: PELVIS - 1-2 VIEW COMPARISON:  October 18, 2012. December 19, 2016 FINDINGS: Minimal cortical offset of the LEFT superior pubic ramus adjacent to the pubic symphysis. Possible lucency traversing the LEFT inferior  pubic ramus. Osteopenia. Vascular calcifications. Degenerative changes of the lower lumbar spine. Sacrum is obscured by overlapping bowel contents. IMPRESSION: Minimal cortical offset of the LEFT superior pubic ramus adjacent to the pubic symphysis. Possible lucency traversing the LEFT inferior pubic ramus. These could reflect nondisplaced fractures. Recommend correlation with point tenderness. If further imaging is desired, CT pelvis would be recommended given osteopenia. Electronically Signed   By: Meda Klinefelter M.D.   On: 03/04/2023 19:05   DG Forearm Left  Result Date: 03/04/2023 CLINICAL DATA:  fall EXAM: LEFT FOREARM - 2 VIEW COMPARISON:  None Available. FINDINGS: There is an impacted, angulated and comminuted fracture of the distal radius with intra-articular extension and apex palmar angulation. There is a minimally displaced fracture of the distal ulnar styloid. Limited assessment of the radiocarpal articulation. Severe degenerative changes of the first CMC. Soft tissue edema. IMPRESSION: 1. Impacted, angulated and comminuted fracture of the distal radius with intra-articular extension. 2. Minimally displaced fracture of the distal ulnar styloid. Electronically Signed   By: Meda Klinefelter M.D.   On: 03/04/2023 19:01   DG Knee 2 Views Right  Result Date: 03/04/2023 CLINICAL DATA:  fall EXAM: RIGHT KNEE - 1-2 VIEW COMPARISON:  June 15, 2022 FINDINGS: Osteopenia. No acute fracture or dislocation. Moderate joint space narrowing of the lateral compartment with subcortical sclerosis. Medial and patellofemoral compartments are relatively preserved. No area of erosion or osseous destruction. No unexpected radiopaque foreign body. Vascular calcifications. IMPRESSION: 1. No acute fracture or dislocation. 2. Moderate degenerative changes of the lateral compartment. Electronically Signed   By: Meda Klinefelter M.D.   On: 03/04/2023 18:59     Subjective: Pt tells me that she would like to try  going home with [facility] versus SNF rehab due to husband being at home alone.  She feels she can manage with assistance of friends, family.    Discharge Exam: Vitals:   03/06/23 0042 03/06/23 0509  BP: (!) 152/62 (!) 133/51  Pulse: 100 77  Resp: 18 18  Temp: 98.9 F (37.2 C) 98.5 F (36.9 C)  SpO2: 91% 94%   Vitals:   03/05/23 2000 03/05/23 2121 03/06/23 0042 03/06/23 0509  BP: (!) 143/62 (!) 140/56 (!) 152/62 (!) 133/51  Pulse: 74 80 100 77  Resp: 13 16 18 18   Temp:  98.6 F (37 C) 98.9 F (37.2 C) 98.5 F (36.9 C)  TempSrc:  Oral  Oral Oral  SpO2: 93% 96% 91% 94%  Weight:      Height:       General: Pt is alert, awake, not in acute distress Cardiovascular: normal S1/S2 +, no rubs, no gallops Respiratory: CTA bilaterally, no wheezing, no rhonchi Abdominal: Soft, NT, ND, bowel sounds + Extremities: left arm in wrap    The results of significant diagnostics from this hospitalization (including imaging, microbiology, ancillary and laboratory) are listed below for reference.     Microbiology: No results found for this or any previous visit (from the past 240 hour(s)).   Labs: BNP (last 3 results) No results for input(s): "BNP" in the last 8760 hours. Basic Metabolic Panel: Recent Labs  Lab 03/04/23 2142 03/05/23 0609  NA 139 139  K 3.6 3.6  CL 104 106  CO2 24 25  GLUCOSE 113* 104*  BUN 54* 47*  CREATININE 2.30* 2.04*  CALCIUM 8.7* 8.5*  MG  --  2.2   Liver Function Tests: Recent Labs  Lab 03/05/23 0609  AST 19  ALT 17  ALKPHOS 55  BILITOT 0.6  PROT 5.7*  ALBUMIN 3.2*   No results for input(s): "LIPASE", "AMYLASE" in the last 168 hours. No results for input(s): "AMMONIA" in the last 168 hours. CBC: Recent Labs  Lab 03/04/23 2142 03/05/23 0609  WBC 8.5 6.4  NEUTROABS  --  4.7  HGB 10.2* 9.8*  HCT 31.4* 30.5*  MCV 101.9* 103.4*  PLT 147* 125*   Cardiac Enzymes: No results for input(s): "CKTOTAL", "CKMB", "CKMBINDEX", "TROPONINI" in the last 168  hours. BNP: Invalid input(s): "POCBNP" CBG: No results for input(s): "GLUCAP" in the last 168 hours. D-Dimer No results for input(s): "DDIMER" in the last 72 hours. Hgb A1c No results for input(s): "HGBA1C" in the last 72 hours. Lipid Profile No results for input(s): "CHOL", "HDL", "LDLCALC", "TRIG", "CHOLHDL", "LDLDIRECT" in the last 72 hours. Thyroid function studies No results for input(s): "TSH", "T4TOTAL", "T3FREE", "THYROIDAB" in the last 72 hours.  Invalid input(s): "FREET3" Anemia work up No results for input(s): "VITAMINB12", "FOLATE", "FERRITIN", "TIBC", "IRON", "RETICCTPCT" in the last 72 hours. Urinalysis    Component Value Date/Time   COLORURINE YELLOW 03/15/2019 0246   APPEARANCEUR Clear 10/09/2020 1412   LABSPEC 1.013 03/15/2019 0246   PHURINE 5.0 03/15/2019 0246   GLUCOSEU Negative 10/09/2020 1412   HGBUR NEGATIVE 03/15/2019 0246   BILIRUBINUR Negative 10/09/2020 1412   KETONESUR 20 (A) 03/15/2019 0246   PROTEINUR Trace (A) 10/09/2020 1412   PROTEINUR 30 (A) 03/15/2019 0246   UROBILINOGEN negative (A) 09/09/2019 1533   NITRITE Positive (A) 10/09/2020 1412   NITRITE NEGATIVE 03/15/2019 0246   LEUKOCYTESUR 2+ (A) 10/09/2020 1412   LEUKOCYTESUR TRACE (A) 03/15/2019 0246   Sepsis Labs Recent Labs  Lab 03/04/23 2142 03/05/23 0609  WBC 8.5 6.4   Microbiology No results found for this or any previous visit (from the past 240 hour(s)).  Time coordinating discharge: 44 mins   SIGNED:  Standley Dakins, MD  [facility] 03/06/2023, 11:10 AM How to contact the [facility] Attending or Consulting provider 7A - 7P or covering provider during after hours 7P -7A, for this patient?  Check the care team in [facility] and look for a) attending/consulting [facility] provider listed and b) the [facility] team listed Log into [email or web address] and use [place]'s universal password to access. If you do not have the password, please contact the hospital operator. Locate the [facility] provider you  are looking for under [facility] and page  to a number that you can be directly reached. If you still have difficulty reaching the provider, please page the [facility] (Director on Call) for the Hospitalists listed on amion for assistance.

## 2023-03-06 NOTE — Discharge Instructions (Signed)
IMPORTANT INFORMATION: PAY CLOSE ATTENTION  ? ?PHYSICIAN DISCHARGE INSTRUCTIONS ? ?Follow with Primary care provider  Golding, John, MD  and other consultants as instructed by your Hospitalist Physician ? ?SEEK MEDICAL CARE OR RETURN TO EMERGENCY ROOM IF SYMPTOMS COME BACK, WORSEN OR NEW PROBLEM DEVELOPS  ? ?Please note: ?You were cared for by a hospitalist during your hospital stay. Every effort will be made to forward records to your primary care provider.  You can request that your primary care provider send for your hospital records if they have not received them.  Once you are discharged, your primary care physician will handle any further medical issues. Please note that NO REFILLS for any discharge medications will be authorized once you are discharged, as it is imperative that you return to your primary care physician (or establish a relationship with a primary care physician if you do not have one) for your post hospital discharge needs so that they can reassess your need for medications and monitor your lab values. ? ?Please get a complete blood count and chemistry panel checked by your Primary MD at your next visit, and again as instructed by your Primary MD. ? ?Get Medicines reviewed and adjusted: ?Please take all your medications with you for your next visit with your Primary MD ? ?Laboratory/radiological data: ?Please request your Primary MD to go over all hospital tests and procedure/radiological results at the follow up, please ask your primary care provider to get all Hospital records sent to his/her office. ? ?In some cases, they will be blood work, cultures and biopsy results pending at the time of your discharge. Please request that your primary care provider follow up on these results. ? ?If you are diabetic, please bring your blood sugar readings with you to your follow up appointment with primary care.   ? ?Please call and make your follow up appointments as soon as possible.   ? ?Also Note  the following: ?If you experience worsening of your admission symptoms, develop shortness of breath, life threatening emergency, suicidal or homicidal thoughts you must seek medical attention immediately by calling 911 or calling your MD immediately  if symptoms less severe. ? ?You must read complete instructions/literature along with all the possible adverse reactions/side effects for all the Medicines you take and that have been prescribed to you. Take any new Medicines after you have completely understood and accpet all the possible adverse reactions/side effects.  ? ?Do not drive when taking Pain medications or sleeping medications (Benzodiazepines) ? ?Do not take more than prescribed Pain, Sleep and Anxiety Medications. It is not advisable to combine anxiety,sleep and pain medications without talking with your primary care practitioner ? ?Special Instructions: If you have smoked or chewed Tobacco  in the last 2 yrs please stop smoking, stop any regular Alcohol  and or any Recreational drug use. ? ?Wear Seat belts while driving.  Do not drive if taking any narcotic, mind altering or controlled substances or recreational drugs or alcohol.  ? ? ? ? ? ?

## 2023-03-06 NOTE — Plan of Care (Signed)
  Problem: Clinical Measurements: Goal: Respiratory complications will improve Outcome: Progressing Goal: Cardiovascular complication will be avoided Outcome: Progressing   Problem: Activity: Goal: Risk for activity intolerance will decrease Outcome: Progressing   Problem: Coping: Goal: Level of anxiety will decrease Outcome: Progressing   Problem: Elimination: Goal: Will not experience complications related to bowel motility Outcome: Progressing Goal: Will not experience complications related to urinary retention Outcome: Progressing   Problem: Pain Management: Goal: General experience of comfort will improve Outcome: Progressing

## 2023-03-06 NOTE — TOC Transition Note (Signed)
Transition of Care [facility]) - CM/SW Discharge Note   Patient Details  Name: [name] MRN: [id number] Date of Birth: 03-19-1934  [facility] of Care [facility] CM/SW Contact:  Leitha Bleak, RN Phone Number: 03/06/2023, 10:58 AM   Clinical Narrative:   Discharge planning for patient to go home. She is agreeable to home health, CMS choices reviewed. Cory with [facility] accepting the referral for HHPT. MD aware to order.    Final next level of care: Home w Home Health Services Barriers to Discharge: Barriers Resolved  Patient Goals and CMS Choice CMS Medicare.gov Compare Post Acute Care list provided to:: Patient Choice offered to / list presented to : Patient  Discharge Placement    Patient and family notified of of transfer: 03/06/23  Discharge Plan and Services Additional resources added to the After Visit Summary for           [facility] Arranged: PT HH Agency: [facility] Date [facility] Agency Contacted: 03/06/23 Time HH Agency Contacted: 1058 Representative spoke with at [facility] Agency: Kandee Keen  Social Determinants of Health (SDOH) Interventions SDOH Screenings   Food Insecurity: No Food Insecurity (03/05/2023)  Tobacco Use: Low Risk  (02/21/2023)

## 2023-03-09 DIAGNOSIS — I10 Essential (primary) hypertension: Secondary | ICD-10-CM | POA: Diagnosis not present

## 2023-03-09 DIAGNOSIS — M1711 Unilateral primary osteoarthritis, right knee: Secondary | ICD-10-CM | POA: Diagnosis not present

## 2023-03-09 DIAGNOSIS — S52572A Other intraarticular fracture of lower end of left radius, initial encounter for closed fracture: Secondary | ICD-10-CM | POA: Diagnosis not present

## 2023-03-09 DIAGNOSIS — N184 Chronic kidney disease, stage 4 (severe): Secondary | ICD-10-CM | POA: Diagnosis not present

## 2023-03-09 DIAGNOSIS — I129 Hypertensive chronic kidney disease with stage 1 through stage 4 chronic kidney disease, or unspecified chronic kidney disease: Secondary | ICD-10-CM | POA: Diagnosis not present

## 2023-03-10 DIAGNOSIS — S52572A Other intraarticular fracture of lower end of left radius, initial encounter for closed fracture: Secondary | ICD-10-CM | POA: Diagnosis not present

## 2023-03-22 ENCOUNTER — Ambulatory Visit: Payer: Medicare Other | Admitting: Gastroenterology

## 2023-03-23 ENCOUNTER — Ambulatory Visit: Payer: Medicare Other | Admitting: Gastroenterology

## 2023-03-23 DIAGNOSIS — M25632 Stiffness of left wrist, not elsewhere classified: Secondary | ICD-10-CM | POA: Diagnosis not present

## 2023-03-23 DIAGNOSIS — I10 Essential (primary) hypertension: Secondary | ICD-10-CM | POA: Diagnosis not present

## 2023-03-23 DIAGNOSIS — I2510 Atherosclerotic heart disease of native coronary artery without angina pectoris: Secondary | ICD-10-CM | POA: Diagnosis not present

## 2023-03-23 DIAGNOSIS — D509 Iron deficiency anemia, unspecified: Secondary | ICD-10-CM | POA: Diagnosis not present

## 2023-03-23 DIAGNOSIS — S52572A Other intraarticular fracture of lower end of left radius, initial encounter for closed fracture: Secondary | ICD-10-CM | POA: Diagnosis not present

## 2023-03-23 DIAGNOSIS — N189 Chronic kidney disease, unspecified: Secondary | ICD-10-CM | POA: Diagnosis not present

## 2023-03-23 DIAGNOSIS — I129 Hypertensive chronic kidney disease with stage 1 through stage 4 chronic kidney disease, or unspecified chronic kidney disease: Secondary | ICD-10-CM | POA: Diagnosis not present

## 2023-03-23 DIAGNOSIS — S52502D Unspecified fracture of the lower end of left radius, subsequent encounter for closed fracture with routine healing: Secondary | ICD-10-CM | POA: Diagnosis not present

## 2023-03-23 DIAGNOSIS — N184 Chronic kidney disease, stage 4 (severe): Secondary | ICD-10-CM | POA: Diagnosis not present

## 2023-03-23 DIAGNOSIS — E538 Deficiency of other specified B group vitamins: Secondary | ICD-10-CM | POA: Diagnosis not present

## 2023-03-23 DIAGNOSIS — R809 Proteinuria, unspecified: Secondary | ICD-10-CM | POA: Diagnosis not present

## 2023-03-23 DIAGNOSIS — N2581 Secondary hyperparathyroidism of renal origin: Secondary | ICD-10-CM | POA: Diagnosis not present

## 2023-04-05 DIAGNOSIS — R809 Proteinuria, unspecified: Secondary | ICD-10-CM | POA: Diagnosis not present

## 2023-04-05 DIAGNOSIS — N2581 Secondary hyperparathyroidism of renal origin: Secondary | ICD-10-CM | POA: Diagnosis not present

## 2023-04-05 DIAGNOSIS — I129 Hypertensive chronic kidney disease with stage 1 through stage 4 chronic kidney disease, or unspecified chronic kidney disease: Secondary | ICD-10-CM | POA: Diagnosis not present

## 2023-04-05 DIAGNOSIS — N184 Chronic kidney disease, stage 4 (severe): Secondary | ICD-10-CM | POA: Diagnosis not present

## 2023-04-13 ENCOUNTER — Telehealth: Payer: Self-pay | Admitting: Cardiology

## 2023-04-13 NOTE — Telephone Encounter (Signed)
Spoke to pt who stated that she's not having cp at the moment, but more of a heart fluttering feeling. Pt stated that she does get lightheadedness mainly when lying in bed or moving to get out of bed. Pt denies sob, n/v. Pt stated that Dr. Wolfgang Phoenix has stopped her amlodipine for now d/t bp readings being low. Pt bp on the call was 168/73 hr- 84.   Pt stated that she fell 10/26 and hurt her knee, wrist, and hip.   Please advise.

## 2023-04-13 NOTE — Telephone Encounter (Signed)
   Pt c/o of Chest Pain: STAT if active CP, including tightness, pressure, jaw pain, radiating pain to shoulder/upper arm/back, CP unrelieved by Nitro. Symptoms reported of SOB, nausea, vomiting, sweating.  1. Are you having CP right now? Fluttering in her chest off and on- having it a little bit at this time    2. Are you experiencing any other symptoms (ex. SOB, nausea, vomiting, sweating)? Lightheaded    3. Is your CP continuous or coming and going? Comes and goes    4. Have you taken Nitroglycerin?  no   5. How long have you been experiencing CP? It is getting more frequent- patient wants to be seen- no availabilty until January    6. If NO CP at time of call then end call with telling Pt to call back or call 911 if Chest pain returns prior to return call from triage team.

## 2023-04-16 IMAGING — DX DG CHEST 2V
2 series · 2 of 2 positions shown · non-contrast
Comparison: Chest x-ray 03/14/2019

CLINICAL DATA: Acute cough. Hx of CAD, HTN, and non-smoker. No
recent Covid test.

EXAM:
CHEST - 2 VIEW

[chest pa]
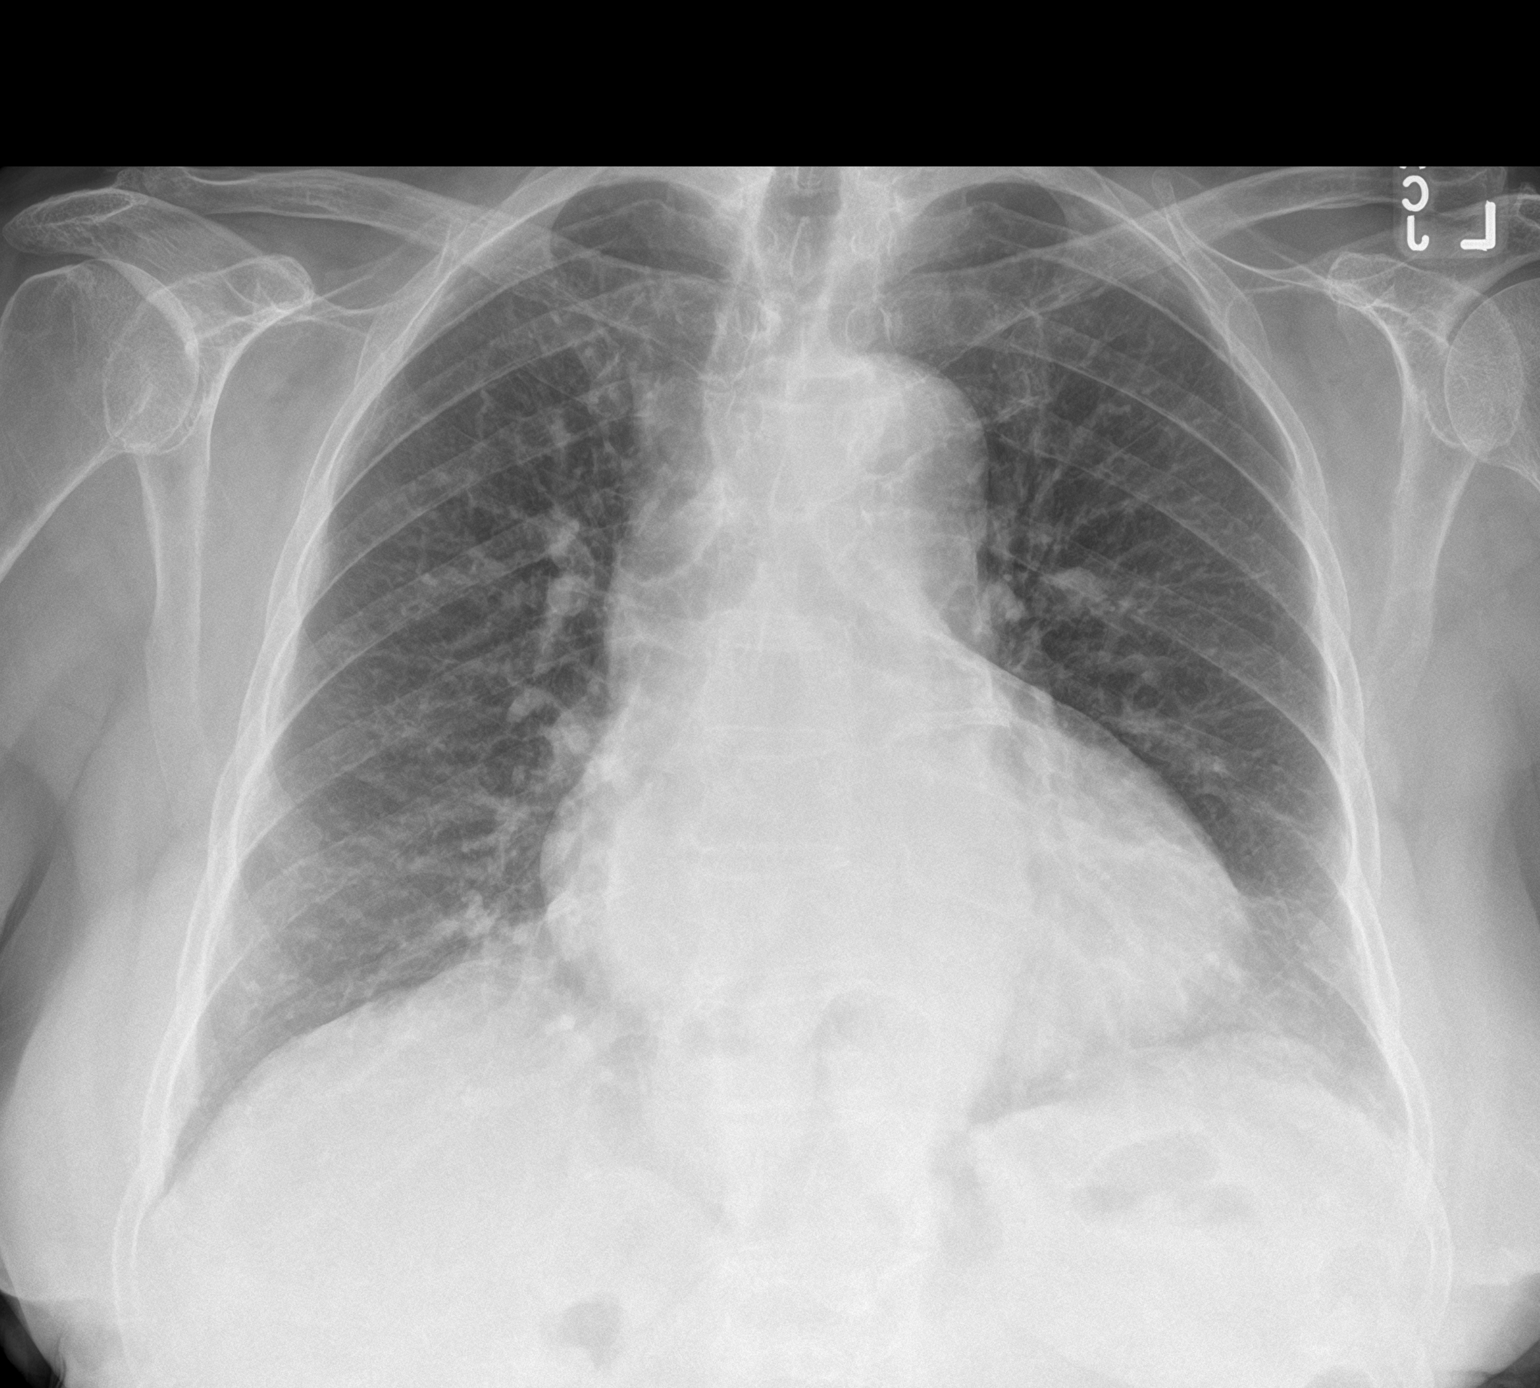

[chest lat]
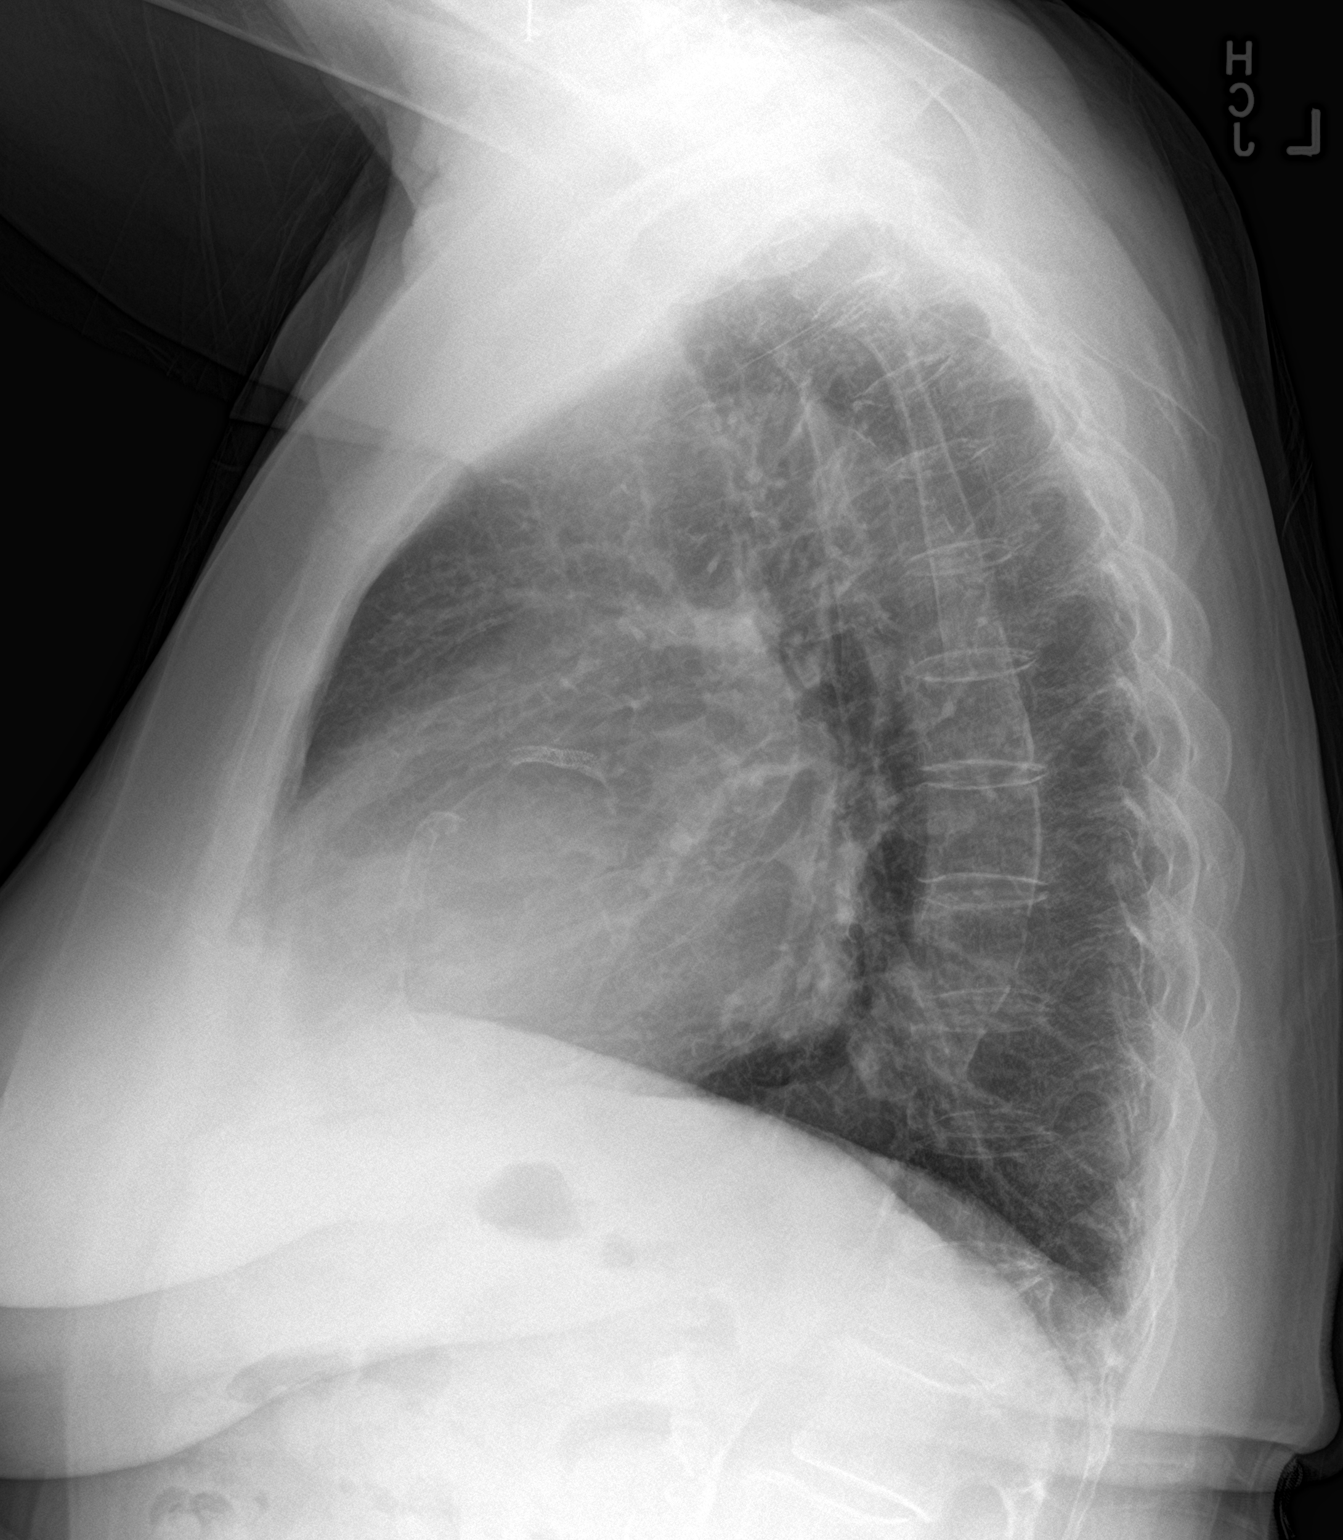

[2 of 2 positions shown; findings below may reference images not displayed]

FINDINGS: Mild enlarged cardiac silhouette. The heart size and mediastinal
contours are unchanged. Coronary artery stents.

No focal consolidation. No pulmonary edema. No pleural effusion. No
pneumothorax.

No acute osseous abnormality.
IMPRESSION: No active cardiopulmonary disease.

## 2023-04-18 NOTE — Telephone Encounter (Signed)
Evaluate at PA appt coming up, if anyhting earlier than current please schedule  Dominga Ferry MD

## 2023-04-18 NOTE — Telephone Encounter (Signed)
Pt notified. Pt r/s w/ B. Strader on 12/13 in the [place] office

## 2023-04-20 ENCOUNTER — Encounter: Payer: Self-pay | Admitting: Gastroenterology

## 2023-04-20 DIAGNOSIS — S52572D Other intraarticular fracture of lower end of left radius, subsequent encounter for closed fracture with routine healing: Secondary | ICD-10-CM | POA: Diagnosis not present

## 2023-04-21 ENCOUNTER — Ambulatory Visit: Payer: Medicare Other | Attending: Student | Admitting: Student

## 2023-04-21 ENCOUNTER — Encounter: Payer: Self-pay | Admitting: Student

## 2023-04-21 VITALS — BP 140/86 | HR 78 | Ht 61.5 in | Wt 140.2 lb

## 2023-04-21 DIAGNOSIS — E785 Hyperlipidemia, unspecified: Secondary | ICD-10-CM | POA: Diagnosis not present

## 2023-04-21 DIAGNOSIS — I2510 Atherosclerotic heart disease of native coronary artery without angina pectoris: Secondary | ICD-10-CM

## 2023-04-21 DIAGNOSIS — I10 Essential (primary) hypertension: Secondary | ICD-10-CM

## 2023-04-21 DIAGNOSIS — N184 Chronic kidney disease, stage 4 (severe): Secondary | ICD-10-CM

## 2023-04-21 DIAGNOSIS — R002 Palpitations: Secondary | ICD-10-CM

## 2023-04-21 MED ORDER — METOPROLOL SUCCINATE ER 25 MG PO TB24: 25.0000 mg | ORAL_TABLET | Freq: Every day | ORAL | 3 refills | Status: DC

## 2023-04-21 NOTE — Patient Instructions (Signed)
Medication Instructions:  Your physician has recommended you make the following change in your medication:   -Restart Toprol XL 25 mg once daily  *If you need a refill on your cardiac medications before your next appointment, please call your pharmacy*   Lab Work: None If you have labs (blood work) drawn today and your tests are completely normal, you will receive your results only by: MyChart Message (if you have MyChart) OR A paper copy in the mail If you have any lab test that is abnormal or we need to change your treatment, we will call you to review the results.   Testing/Procedures: None   Follow-Up: At [facility], you and your health needs are our priority.  As part of our continuing mission to provide you with exceptional heart care, we have created designated Provider Care Teams.  These Care Teams include your primary Cardiologist (physician) and Advanced Practice Providers (APPs -  Physician Assistants and Nurse Practitioners) who all work together to provide you with the care you need, when you need it.  We recommend signing up for the patient portal called "MyChart".  Sign up information is provided on this After Visit Summary.  MyChart is used to connect with patients for Virtual Visits (Telemedicine).  Patients are able to view lab/test results, encounter notes, upcoming appointments, etc.  Non-urgent messages can be sent to your provider as well.   To learn more about what you can do with MyChart, go to [email or web address].    Your next appointment:   5-6 month(s)  Provider:   You may see Dina Rich, MD or one of the following Advanced Practice Providers on your designated Care Team:   Randall An, PA-C  Jacolyn Reedy, [place]     Other Instructions Please let us know in 2-3 weeks if palpitations have not improved

## 2023-04-21 NOTE — Progress Notes (Signed)
Cardiology Office Note    Date:  04/21/2023  ID:  [name], DOB [birth year], MRN [id number] Cardiologist: Dina Rich, MD    History of Present Illness:    [name] is a 89 y.o. female with past medical history of CAD (s/p rotational atherectomy and DES placement to LAD in 2012, low-risk NST in 08/2018), HTN, HLD, known LBBB and Stage 3-4 CKD presents to the office today for evaluation of palpitations.  She was examined by Dr. Wyline Mood in 07/2022 and reported still having occasional dizziness and palpitations.  Recent monitor in 07/2022 had shown predominantly normal sinus rhythm with rare PAC's and PVC's but no significant arrhythmias. Toprol-XL had previously been lowered to see if this would help with her fatigue and she was continued on her current dosing at that time of 50 mg daily. Was also continued on Amlodipine 10 mg daily, ASA 81 mg daily, Chlorthalidone 25 mg daily, Lasix 40 mg as needed, Imdur 120 mg daily and Crestor 5 mg daily.  In the interim, she was admitted to [facility] in 02/2023 after suffering a mechanical fall and found to have a nondisplaced pubic rami fracture along with suspected nondisplaced left sacral alar fracture. Was admitted for pain control and PT. SNF was recommended but she declined and was discharged home with home health.  She called the office earlier this month reporting more frequent "chest fluttering" and a follow-up visit was arranged.  In talking with the patient today, she reports having more frequent palpitations which typically occur at night. She feels more of a thumping sensation in her chest instead of specific tachypalpitations. Reports symptoms can occur during the day as well but not as severe. Denies any recent chest pain or progressive dyspnea on exertion. No specific orthopnea, PND or pitting edema. She does wear compression hose routinely. In reviewing her medications, she has been off Toprol-XL and reports stopping this 1 to 2  months ago due to her blood pressure being low and due to thinking it was only for blood pressure control. She has been under increased stress given her orthopedic issues and she is also the primary caregiver for her husband who is currently on Hospice.  Studies Reviewed:   EKG: EKG is ordered today and demonstrates:   EKG Interpretation Date/Time:  Friday April 21 2023 13:20:33 EST Ventricular Rate:  76 PR Interval:  188 QRS Duration:  154 QT Interval:  436 QTC Calculation: 490 R Axis:   87  Text Interpretation: Normal sinus rhythm Known LBBB Confirmed by Randall An ([id number]) on 04/21/2023 4:01:27 PM       NST: 08/2018 This is a low risk study. Nuclear stress EF: 60%. LBBB seen throughout study. Mild, small fixed septal defect likely due to LBBB.   Echocardiogram: 07/2021 IMPRESSIONS     1. Left ventricular ejection fraction, by estimation, is 60 to 65%. The  left ventricle has normal function. The left ventricle has no regional  wall motion abnormalities. There is moderate left ventricular hypertrophy.  Left ventricular diastolic  parameters are consistent with Grade I diastolic dysfunction (impaired  relaxation).   2. Right ventricular systolic function is low normal. The right  ventricular size is normal. There is normal pulmonary artery systolic  pressure. The estimated right ventricular systolic pressure is 21.7 mmHg.   3. Left atrial size was severely dilated.   4. A small pericardial effusion is present. The pericardial effusion is  circumferential. There is no evidence of cardiac tamponade.  5. The mitral valve is abnormal. Trivial mitral valve regurgitation.   6. The aortic valve is tricuspid. Aortic valve regurgitation is not  visualized.   7. The inferior vena cava is normal in size with greater than 50%  respiratory variability, suggesting right atrial pressure of 3 mmHg.    Event Monitor: 07/2022   7 day monitor   Rare supraventricular ectopy  in the form of isolated PACs, couplets, triplets. 6 runs of SVT longest 5 beats   Rare ventricular ectopy in the form of isolated PVCs   Reported symptoms correlated with sinus rhythm, rare ectopy     Patch Wear Time:  7 days and 0 hours (2024-02-24T17:32:43-0500 to 2024-03-02T18:28:40-0500)   Patient had a min HR of 51 bpm, max HR of 182 bpm, and avg HR of 74 bpm. Predominant underlying rhythm was Sinus Rhythm. 6 Supraventricular Tachycardia runs occurred, the run with the fastest interval lasting 5 beats with a max rate of 182 bpm, the  longest lasting 5 beats with an avg rate of 120 bpm. Isolated SVEs were rare (<1.0%), SVE Couplets were rare (<1.0%), and SVE Triplets were rare (<1.0%). Isolated VEs were rare (<1.0%), and no VE Couplets or VE Triplets were present. QRS morphology  changes were present due to Intermittent Bundle Branch Block.    Risk Assessment/Calculations:     HYPERTENSION CONTROL Vitals:   04/21/23 1315 04/21/23 1402  BP: (!) 144/70 (!) 140/86    The patient's blood pressure is elevated above target today.  In order to address the patient's elevated BP: A new medication was prescribed today.     Physical Exam:   VS:  BP (!) 140/86   Pulse 78   Ht 5' 1.5" (1.562 m)   Wt 140 lb 3.2 oz (63.6 kg)   SpO2 97%   BMI 26.06 kg/m    Wt Readings from Last 3 Encounters:  04/21/23 140 lb 3.2 oz (63.6 kg)  03/04/23 151 lb 0.2 oz (68.5 kg)  12/21/22 151 lb (68.5 kg)     GEN: Pleasant, elderly female appearing in no acute distress NECK: No JVD; No carotid bruits CARDIAC: RRR, no murmurs, rubs, gallops RESPIRATORY:  Clear to auscultation without rales, wheezing or rhonchi  ABDOMEN: Appears non-distended. No obvious abdominal masses. EXTREMITIES: No clubbing or cyanosis. No pitting edema.  Distal pedal pulses are 2+ bilaterally.   Assessment and Plan:   1. Palpitations - Prior monitor in 07/2022 showed predominantly normal sinus rhythm with brief episodes of  SVT and PAC's and PVC's but overall less than 1% burden. She stopped Toprol-XL to due to soft BP in the past and was unaware this helped with palpitations. Will plan to resume at a lower dose of 25 mg daily (previously on 50mg  daily). I encouraged her to follow BP at home and if she develops recurrent hypotension, would reduce Amlodipine to 2.5 mg daily. Also encouraged her make [place] aware if palpitations do not improve as Toprol-XL can be further titrated but will adjust gradually given her reports of hypotension in the past.  2. CAD - She is s/p rotational atherectomy and DES placement to LAD in 2012 and she did have a low-risk NST in 08/2018. She denies any recent anginal symptoms. - Continue ASA 81 mg daily, Crestor 5 mg daily and Imdur 60 mg twice daily. Will restart Toprol-XL 25 mg daily.  3. HTN - Blood pressure is slightly elevated today, at 140/86 on recheck. She is currently taking Amlodipine 5 mg daily and Imdur  60 mg twice daily. Will restart Toprol-XL at 25 mg daily as discussed above. If she develops soft BP with this adjustment, would reduce Amlodipine to 2.5 mg daily.  4. HLD - Followed by her PCP.  LDL was at 63 in 03/2023 by review of [facility] DXA. She has been continued on Crestor 5 mg daily.  5. Stage 4 CKD - Followed by Dr. Wolfgang Phoenix. Creatinine was at 2.04 when checked in 02/2023 which is close to her known baseline.  Signed, Ellsworth Lennox, PA-C

## 2023-04-28 DIAGNOSIS — M25561 Pain in right knee: Secondary | ICD-10-CM | POA: Diagnosis not present

## 2023-05-11 ENCOUNTER — Ambulatory Visit: Payer: Medicare Other | Admitting: Nurse Practitioner

## 2023-05-17 ENCOUNTER — Telehealth: Payer: Self-pay | Admitting: Student

## 2023-05-17 NOTE — Telephone Encounter (Signed)
 I spoke with patient and she says she occasionally checks bp and his has not been low. She says her palpitations are much better now .

## 2023-05-17 NOTE — Telephone Encounter (Signed)
 Patient will remain on current dose .

## 2023-05-17 NOTE — Telephone Encounter (Signed)
 Pt c/o medication issue:  1. Name of Medication:   metoprolol  succinate (TOPROL  XL) 25 MG 24 hr tablet    2. How are you currently taking this medication (dosage and times per day)? Take 1 tablet (25 mg total) by mouth daily.   3. Are you having a reaction (difficulty breathing--STAT)? No  4. What is your medication issue? Pt calling as requested to let PA know how she is doing with medication. Pt states that she is doing well with medication. Please advise

## 2023-05-19 DIAGNOSIS — M1711 Unilateral primary osteoarthritis, right knee: Secondary | ICD-10-CM | POA: Diagnosis not present

## 2023-05-23 DIAGNOSIS — E538 Deficiency of other specified B group vitamins: Secondary | ICD-10-CM | POA: Diagnosis not present

## 2023-05-30 ENCOUNTER — Encounter: Payer: Self-pay | Admitting: Obstetrics & Gynecology

## 2023-05-30 ENCOUNTER — Ambulatory Visit: Payer: Medicare Other | Admitting: Obstetrics & Gynecology

## 2023-05-30 VITALS — BP 142/61 | HR 68 | Ht 62.0 in | Wt 143.0 lb

## 2023-05-30 DIAGNOSIS — R300 Dysuria: Secondary | ICD-10-CM

## 2023-05-30 DIAGNOSIS — N898 Other specified noninflammatory disorders of vagina: Secondary | ICD-10-CM

## 2023-05-30 DIAGNOSIS — M1711 Unilateral primary osteoarthritis, right knee: Secondary | ICD-10-CM | POA: Diagnosis not present

## 2023-05-30 LAB — POCT URINALYSIS DIPSTICK
Blood, UA: NEGATIVE
Glucose, UA: NEGATIVE
Ketones, UA: NEGATIVE
Leukocytes, UA: NEGATIVE
Nitrite, UA: NEGATIVE
Protein, UA: NEGATIVE

## 2023-05-30 MED ORDER — CLOBETASOL PROPIONATE 0.05 % EX OINT: 1.0000 | TOPICAL_OINTMENT | Freq: Two times a day (BID) | CUTANEOUS | 3 refills | Status: DC

## 2023-05-30 NOTE — Progress Notes (Signed)
Follow up appointment for results: Response to estrogen cream  Chief Complaint  Patient presents with   Follow-up    Blood pressure (!) 142/61, pulse 68, height 5\' 2"  (1.575 m), weight 143 lb (64.9 kg).  Pt states her symptoms have not really improved She states her symptoms are confined to the area just above her urethra  Exam is significant only for severe atrophy No caruncle noted Her urinary symptoms I believe are related to her tissue response to her urine  MEDS ordered this encounter: Meds ordered this encounter  Medications   clobetasol ointment (TEMOVATE) 0.05 %    Sig: Apply 1 Application topically 2 (two) times daily.    Dispense:  30 g    Refill:  3    Orders for this encounter: Orders Placed This Encounter  Procedures   POCT Urinalysis Dipstick    Impression + Management Plan   ICD-10-CM   1. Vulovaginal irritation: will treat as if lichen sclerosus with topical steroid empirically  N89.8     2. Dysuria  R30.0 POCT Urinalysis Dipstick      Follow Up: Return in about 6 weeks (around 07/11/2023) for Follow up, with Dr Despina Hidden.     All questions were answered.  Past Medical History:  Diagnosis Date   Anemia    Anxiety    Arthritis    Bleeding from the nose Oct 2014   CAD (coronary artery disease)    a. s/p rotational atherectomy and DES placement to LAD in 2012 b. low-risk NST in 08/2018   Cataract of both eyes    Chronic kidney disease    Colon polyps    Hyperlipidemia    Hypertension    x 5-6 yrs.   LBBB (left bundle branch block)    Renal insufficiency     Past Surgical History:  Procedure Laterality Date   BREAST SURGERY     masses removed while she was pregnant   CARDIAC CATHETERIZATION  06/20/2008   severe single vessel high-grade stenosis of mid LAD at bifurcation of diagonal 1 and setpal perforator 1; diffuse coronary calcification of L coronary system; 50% stenosis of mid RCA (Dr. Evlyn Courier)   CATARACT EXTRACTION W/PHACO Right  10/10/2013   Procedure: CATARACT EXTRACTION PHACO AND INTRAOCULAR LENS PLACEMENT (IOC);  Surgeon: Gemma Payor, MD;  Location: [facility];  Service: Ophthalmology;  Laterality: Right;  CDE:11.96   CATARACT EXTRACTION W/PHACO Left 11/18/2013   Procedure: CATARACT EXTRACTION PHACO AND INTRAOCULAR LENS PLACEMENT (IOC);  Surgeon: Gemma Payor, MD;  Location: [facility];  Service: Ophthalmology;  Laterality: Left;  CDE 15.96   COLONOSCOPY N/A 01/16/2013   Procedure: COLONOSCOPY;  Surgeon: Malissa Hippo, MD; igmoid colon diverticulosis as well as changes of diverticulitis involving one diverticulum. She also had external hemorrhoids.   CORONARY ANGIOPLASTY WITH STENT PLACEMENT  11/08/2010   complex high-speed rotational atherectomy of calcified LAD, diffusely disease LAD system (Dr. Bishop Limbo)   EXCISION ORAL TUMOR Right 01/14/2014   Procedure: EXCISION OROPHARYNGEAL MASS;  Surgeon: Darletta Moll, MD;  Location: [facility];  Service: ENT;  Laterality: Right;   NM MYOCAR PERF WALL MOTION  05/10/2011   lexiscan myoview; normal pattern of perfusion in all regions; post-stress EF 55%; low risk scan    PARTIAL HYSTERECTOMY     TONSILLECTOMY     TRANSTHORACIC ECHOCARDIOGRAM  10/08/2010   EF 45-50%, mod conc LVH, grade 1 diastolic dysfunction, mildly calcified left AV cusp; calcified MV annulus    OB History  Gravida  4   Para  4   Term      Preterm      AB      Living  3      SAB      IAB      Ectopic      Multiple      Live Births              Allergies  Allergen Reactions   Macrobid [Nitrofurantoin] Nausea And Vomiting    Social History   Socioeconomic History   Marital status: Married    Spouse name: Not on file   Number of children: 4   Years of education: 8   Highest education level: Not on file  Occupational History   Not on file  Tobacco Use   Smoking status: Never   Smokeless tobacco: Never  Vaping Use   Vaping status: Never Used  Substance and  Sexual Activity   Alcohol use: No   Drug use: No   Sexual activity: Yes    Birth control/protection: Surgical  Other Topics Concern   Not on file  Social History Narrative   Not on file   Social Drivers of Health   Financial Resource Strain: Not on file  Food Insecurity: No Food Insecurity (03/05/2023)   Hunger Vital Sign    Worried About Running Out of Food in the Last Year: Never true    Ran Out of Food in the Last Year: Never true  Transportation Needs: Not on file  Physical Activity: Not on file  Stress: Not on file  Social Connections: Not on file    Family History  Problem Relation Age of Onset   Liver cancer Mother    Cancer Father    Heart Problems Brother    Coronary artery disease Sister        stent   Heart Problems Daughter        also stomach problems   COPD Daughter    Colon cancer Neg Hx

## 2023-06-01 DIAGNOSIS — S52572D Other intraarticular fracture of lower end of left radius, subsequent encounter for closed fracture with routine healing: Secondary | ICD-10-CM | POA: Diagnosis not present

## 2023-06-06 DIAGNOSIS — M1711 Unilateral primary osteoarthritis, right knee: Secondary | ICD-10-CM | POA: Diagnosis not present

## 2023-06-13 ENCOUNTER — Other Ambulatory Visit (HOSPITAL_COMMUNITY)
Admission: RE | Admit: 2023-06-13 | Discharge: 2023-06-13 | Disposition: A | Discharge status: Home / Self Care | Payer: Medicare Other | Source: Ambulatory Visit | Attending: Nephrology | Admitting: Nephrology

## 2023-06-13 DIAGNOSIS — R809 Proteinuria, unspecified: Secondary | ICD-10-CM | POA: Diagnosis not present

## 2023-06-13 DIAGNOSIS — D631 Anemia in chronic kidney disease: Secondary | ICD-10-CM | POA: Insufficient documentation

## 2023-06-13 DIAGNOSIS — N390 Urinary tract infection, site not specified: Secondary | ICD-10-CM | POA: Insufficient documentation

## 2023-06-13 DIAGNOSIS — N184 Chronic kidney disease, stage 4 (severe): Secondary | ICD-10-CM | POA: Insufficient documentation

## 2023-06-13 DIAGNOSIS — E211 Secondary hyperparathyroidism, not elsewhere classified: Secondary | ICD-10-CM | POA: Insufficient documentation

## 2023-06-13 DIAGNOSIS — E559 Vitamin D deficiency, unspecified: Secondary | ICD-10-CM | POA: Insufficient documentation

## 2023-06-13 DIAGNOSIS — R8289 Other abnormal findings on cytological and histological examination of urine: Secondary | ICD-10-CM | POA: Diagnosis not present

## 2023-06-13 LAB — RENAL FUNCTION PANEL
Albumin: 4.2 g/dL (ref 3.5–5.0)
Anion gap: 13 (ref 5–15)
BUN: 44 mg/dL — ABNORMAL HIGH (ref 8–23)
CO2: 25 mmol/L (ref 22–32)
Calcium: 9.1 mg/dL (ref 8.9–10.3)
Chloride: 101 mmol/L (ref 98–111)
Creatinine, Ser: 2.52 mg/dL — ABNORMAL HIGH (ref 0.44–1.00)
GFR, Estimated: 18 mL/min — ABNORMAL LOW (ref >60)
Glucose, Bld: 129 mg/dL — ABNORMAL HIGH (ref 70–99)
Phosphorus: 5.3 mg/dL — ABNORMAL HIGH (ref 2.5–4.6)
Potassium: 4 mmol/L (ref 3.5–5.1)
Sodium: 139 mmol/L (ref 135–145)

## 2023-06-13 LAB — URINALYSIS, W/ REFLEX TO CULTURE (INFECTION SUSPECTED)
Bilirubin Urine: NEGATIVE
Glucose, UA: NEGATIVE mg/dL
Hgb urine dipstick: NEGATIVE
Ketones, ur: NEGATIVE mg/dL
Nitrite: NEGATIVE
Protein, ur: 30 mg/dL — AB
RBC / HPF: >50 RBC/hpf (ref 0–5)
Specific Gravity, Urine: 1.014 (ref 1.005–1.030)
WBC, UA: >50 WBC/hpf (ref 0–5)
pH: 5 (ref 5.0–8.0)

## 2023-06-13 LAB — CBC
HCT: 33.8 % — ABNORMAL LOW (ref 36.0–46.0)
Hemoglobin: 11.2 g/dL — ABNORMAL LOW (ref 12.0–15.0)
MCH: 33.8 pg (ref 26.0–34.0)
MCHC: 33.1 g/dL (ref 30.0–36.0)
MCV: 102.1 fL — ABNORMAL HIGH (ref 80.0–100.0)
Platelets: 163 10*3/uL (ref 150–400)
RBC: 3.31 MIL/uL — ABNORMAL LOW (ref 3.87–5.11)
RDW: 13.2 % (ref 11.5–15.5)
WBC: 6.1 10*3/uL (ref 4.0–10.5)
nRBC: 0 % (ref 0.0–0.2)

## 2023-06-13 LAB — PROTEIN / CREATININE RATIO, URINE
Creatinine, Urine: 237 mg/dL
Protein Creatinine Ratio: 0.11 mg/mg{creat} (ref 0.00–0.15)
Total Protein, Urine: 27 mg/dL

## 2023-06-13 LAB — VITAMIN D 25 HYDROXY (VIT D DEFICIENCY, FRACTURES): Vit D, 25-Hydroxy: 44.74 ng/mL (ref 30–100)

## 2023-06-15 DIAGNOSIS — N184 Chronic kidney disease, stage 4 (severe): Secondary | ICD-10-CM | POA: Diagnosis not present

## 2023-06-15 DIAGNOSIS — R809 Proteinuria, unspecified: Secondary | ICD-10-CM | POA: Diagnosis not present

## 2023-06-15 DIAGNOSIS — N2581 Secondary hyperparathyroidism of renal origin: Secondary | ICD-10-CM | POA: Diagnosis not present

## 2023-06-15 DIAGNOSIS — I129 Hypertensive chronic kidney disease with stage 1 through stage 4 chronic kidney disease, or unspecified chronic kidney disease: Secondary | ICD-10-CM | POA: Diagnosis not present

## 2023-06-15 LAB — PARATHYROID HORMONE, INTACT (NO CA): PTH: 75 pg/mL — ABNORMAL HIGH (ref 15–65)

## 2023-06-20 ENCOUNTER — Ambulatory Visit: Payer: Medicare Other | Admitting: Obstetrics & Gynecology

## 2023-06-20 VITALS — BP 135/69 | HR 84

## 2023-06-20 DIAGNOSIS — N898 Other specified noninflammatory disorders of vagina: Secondary | ICD-10-CM | POA: Diagnosis not present

## 2023-06-20 DIAGNOSIS — R300 Dysuria: Secondary | ICD-10-CM

## 2023-06-20 DIAGNOSIS — R102 Pelvic and perineal pain: Secondary | ICD-10-CM

## 2023-06-20 LAB — POCT URINALYSIS DIPSTICK OB
Blood, UA: NEGATIVE
Glucose, UA: NEGATIVE
Ketones, UA: NEGATIVE
Leukocytes, UA: NEGATIVE
Nitrite, UA: NEGATIVE
POC,PROTEIN,UA: NEGATIVE

## 2023-06-20 MED ORDER — URELLE 81 MG PO TABS: 1.0000 | ORAL_TABLET | Freq: Three times a day (TID) | ORAL | 1 refills | Status: AC

## 2023-06-20 MED ORDER — DIAZEPAM 5 MG PO TABS: ORAL_TABLET | ORAL | 1 refills | Status: DC

## 2023-06-22 ENCOUNTER — Telehealth: Payer: Self-pay | Admitting: *Deleted

## 2023-06-22 NOTE — Telephone Encounter (Signed)
[facility] called to let us know urine culture would need to be resent as it was received room temperature but needed to be refrigerated.  Called patient to make her aware but states she feels better since starting the Valium vaginally and would let us know if symptoms returned.

## 2023-06-23 ENCOUNTER — Other Ambulatory Visit: Payer: Self-pay | Admitting: Obstetrics & Gynecology

## 2023-06-23 DIAGNOSIS — R300 Dysuria: Secondary | ICD-10-CM

## 2023-06-23 LAB — URINE CULTURE

## 2023-06-23 LAB — SPECIMEN STATUS REPORT

## 2023-06-23 NOTE — Progress Notes (Signed)
Patient came to office to leave urine sample as she is still having burning with urination.  Original culture was cancelled.

## 2023-06-26 ENCOUNTER — Other Ambulatory Visit: Payer: Self-pay | Admitting: Obstetrics & Gynecology

## 2023-06-26 ENCOUNTER — Telehealth: Payer: Self-pay | Admitting: *Deleted

## 2023-06-26 MED ORDER — SULFAMETHOXAZOLE-TRIMETHOPRIM 800-160 MG PO TABS: 1.0000 | ORAL_TABLET | Freq: Two times a day (BID) | ORAL | 0 refills | Status: DC

## 2023-06-26 NOTE — Telephone Encounter (Signed)
Patient informed urine did grow bacteria. Bactrim sent in and will take twice daily for 7 days.  Pt verbalized understanding.

## 2023-06-27 DIAGNOSIS — D510 Vitamin B12 deficiency anemia due to intrinsic factor deficiency: Secondary | ICD-10-CM | POA: Diagnosis not present

## 2023-06-27 LAB — URINE CULTURE

## 2023-06-28 ENCOUNTER — Telehealth: Payer: Self-pay | Admitting: *Deleted

## 2023-06-28 NOTE — Telephone Encounter (Signed)
Patient states she has taken 3 doses of the Bactrim and has noticed hives on her lower extremities. She has stopped taking the medication and would like a different medication sent in to her pharmacy.

## 2023-06-29 MED ORDER — CEPHALEXIN 500 MG PO CAPS: 500.0000 mg | ORAL_CAPSULE | Freq: Three times a day (TID) | ORAL | 0 refills | Status: DC

## 2023-06-29 NOTE — Telephone Encounter (Signed)
Pt made aware Keflex has been sent in.

## 2023-07-03 NOTE — Progress Notes (Signed)
 Chief Complaint  Patient presents with   Follow-up    ?uti      89 y.o. G4P4 No LMP recorded. Patient has had a hysterectomy. The current method of family planning is status post hysterectomy and post menopausal status.  Outpatient Encounter Medications as of 06/20/2023  Medication Sig   acetaminophen (TYLENOL) 325 MG tablet Take 2 tablets (650 mg total) by mouth in the morning, at noon, and at bedtime.   ALPRAZolam (XANAX) 0.5 MG tablet Take 0.5 mg by mouth See admin instructions. Take 0.5 mg (1 tablet) nightly at bedtime. Takes an additional 0.5 mg during the day if needed for anxiety.   amLODipine (NORVASC) 10 MG tablet Take 0.5 tablets (5 mg total) by mouth daily in the afternoon.   clobetasol ointment (TEMOVATE) 0.05 % Apply 1 Application topically 2 (two) times daily.   diazepam (VALIUM) 5 MG tablet 1 tablet per vagina at bedtime   isosorbide mononitrate (IMDUR) 60 MG 24 hr tablet Take 1 tablet (60 mg total) by mouth 2 (two) times daily.   metoprolol succinate (TOPROL XL) 25 MG 24 hr tablet Take 1 tablet (25 mg total) by mouth daily.   Multiple Vitamins-Minerals (ONE A DAY WOMEN 50 PLUS) TABS Take 1 tablet by mouth daily.   nitroGLYCERIN (NITROSTAT) 0.4 MG SL tablet Place 1 tablet (0.4 mg total) under the tongue every 5 (five) minutes as needed for chest pain.   rosuvastatin (CRESTOR) 5 MG tablet Take 1 tablet (5 mg total) by mouth at bedtime.   sodium bicarbonate 650 MG tablet Take 325 mg by mouth 3 (three) times daily.   Urelle (URELLE/URISED) 81 MG TABS tablet Take 1 tablet (81 mg total) by mouth 3 (three) times daily.   No facility-administered encounter medications on file as of 06/20/2023.    Subjective Pt in with ongoing vulvginal irritation and a sensation of pressure in her vagina Previous urine cultures have been negative Her UA today is clear but culture is sent for evaluation No empiric treatment  She always seems to get some relief from every treatment  modality for a brief time, a few week or so then her symptoms return  She has severe atrophy and no other pathology is noted on previous exams  Past Medical History:  Diagnosis Date   Anemia    Anxiety    Arthritis    Bleeding from the nose Oct 2014   CAD (coronary artery disease)    a. s/p rotational atherectomy and DES placement to LAD in 2012 b. low-risk NST in 08/2018   Cataract of both eyes    Chronic kidney disease    Colon polyps    Hyperlipidemia    Hypertension    x 5-6 yrs.   LBBB (left bundle branch block)    Renal insufficiency     Past Surgical History:  Procedure Laterality Date   BREAST SURGERY     masses removed while she was pregnant   CARDIAC CATHETERIZATION  06/20/2008   severe single vessel high-grade stenosis of mid LAD at bifurcation of diagonal 1 and setpal perforator 1; diffuse coronary calcification of L coronary system; 50% stenosis of mid RCA (Dr. Evlyn Courier)   CATARACT EXTRACTION W/PHACO Right 10/10/2013   Procedure: CATARACT EXTRACTION PHACO AND INTRAOCULAR LENS PLACEMENT (IOC);  Surgeon: Gemma Payor, MD;  Location: [facility];  Service: Ophthalmology;  Laterality: Right;  CDE:11.96   CATARACT EXTRACTION W/PHACO Left 11/18/2013   Procedure: CATARACT EXTRACTION PHACO AND INTRAOCULAR LENS  PLACEMENT (IOC);  Surgeon: Gemma Payor, MD;  Location: [facility];  Service: Ophthalmology;  Laterality: Left;  CDE 15.96   COLONOSCOPY N/A 01/16/2013   Procedure: COLONOSCOPY;  Surgeon: Malissa Hippo, MD; igmoid colon diverticulosis as well as changes of diverticulitis involving one diverticulum. She also had external hemorrhoids.   CORONARY ANGIOPLASTY WITH STENT PLACEMENT  11/08/2010   complex high-speed rotational atherectomy of calcified LAD, diffusely disease LAD system (Dr. Bishop Limbo)   EXCISION ORAL TUMOR Right 01/14/2014   Procedure: EXCISION OROPHARYNGEAL MASS;  Surgeon: Darletta Moll, MD;  Location: [facility];  Service: ENT;  Laterality: Right;   NM  MYOCAR PERF WALL MOTION  05/10/2011   lexiscan myoview; normal pattern of perfusion in all regions; post-stress EF 55%; low risk scan    PARTIAL HYSTERECTOMY     TONSILLECTOMY     TRANSTHORACIC ECHOCARDIOGRAM  10/08/2010   EF 45-50%, mod conc LVH, grade 1 diastolic dysfunction, mildly calcified left AV cusp; calcified MV annulus    OB History     Gravida  4   Para  4   Term      Preterm      AB      Living  3      SAB      IAB      Ectopic      Multiple      Live Births              Allergies  Allergen Reactions   Bactrim [Sulfamethoxazole-Trimethoprim] Hives   Macrobid [Nitrofurantoin] Nausea And Vomiting    Social History   Socioeconomic History   Marital status: Married    Spouse name: Not on file   Number of children: 4   Years of education: 8   Highest education level: Not on file  Occupational History   Not on file  Tobacco Use   Smoking status: Never   Smokeless tobacco: Never  Vaping Use   Vaping status: Never Used  Substance and Sexual Activity   Alcohol use: No   Drug use: No   Sexual activity: Yes    Birth control/protection: Surgical  Other Topics Concern   Not on file  Social History Narrative   Not on file   Social Drivers of Health   Financial Resource Strain: Not on file  Food Insecurity: No Food Insecurity (03/05/2023)   Hunger Vital Sign    Worried About Running Out of Food in the Last Year: Never true    Ran Out of Food in the Last Year: Never true  Transportation Needs: Not on file  Physical Activity: Not on file  Stress: Not on file  Social Connections: Not on file    Family History  Problem Relation Age of Onset   Liver cancer Mother    Cancer Father    Heart Problems Brother    Coronary artery disease Sister        stent   Heart Problems Daughter        also stomach problems   COPD Daughter    Colon cancer Neg Hx     Medications:       Current Outpatient Medications:    acetaminophen (TYLENOL) 325  MG tablet, Take 2 tablets (650 mg total) by mouth in the morning, at noon, and at bedtime., Disp: , Rfl:    ALPRAZolam (XANAX) 0.5 MG tablet, Take 0.5 mg by mouth See admin instructions. Take 0.5 mg (1 tablet) nightly at bedtime. Takes  an additional 0.5 mg during the day if needed for anxiety., Disp: , Rfl:    amLODipine (NORVASC) 10 MG tablet, Take 0.5 tablets (5 mg total) by mouth daily in the afternoon., Disp: , Rfl:    clobetasol ointment (TEMOVATE) 0.05 %, Apply 1 Application topically 2 (two) times daily., Disp: 30 g, Rfl: 3   diazepam (VALIUM) 5 MG tablet, 1 tablet per vagina at bedtime, Disp: 30 tablet, Rfl: 1   isosorbide mononitrate (IMDUR) 60 MG 24 hr tablet, Take 1 tablet (60 mg total) by mouth 2 (two) times daily., Disp: , Rfl:    metoprolol succinate (TOPROL XL) 25 MG 24 hr tablet, Take 1 tablet (25 mg total) by mouth daily., Disp: 90 tablet, Rfl: 3   Multiple Vitamins-Minerals (ONE A DAY WOMEN 50 PLUS) TABS, Take 1 tablet by mouth daily., Disp: , Rfl:    nitroGLYCERIN (NITROSTAT) 0.4 MG SL tablet, Place 1 tablet (0.4 mg total) under the tongue every 5 (five) minutes as needed for chest pain., Disp: 25 tablet, Rfl: 3   rosuvastatin (CRESTOR) 5 MG tablet, Take 1 tablet (5 mg total) by mouth at bedtime., Disp: , Rfl:    sodium bicarbonate 650 MG tablet, Take 325 mg by mouth 3 (three) times daily., Disp: , Rfl:    sulfamethoxazole-trimethoprim (BACTRIM DS) 800-160 MG tablet, Take 1 tablet by mouth 2 (two) times daily., Disp: 14 tablet, Rfl: 0   Urelle (URELLE/URISED) 81 MG TABS tablet, Take 1 tablet (81 mg total) by mouth 3 (three) times daily., Disp: 90 tablet, Rfl: 1   cephALEXin (KEFLEX) 500 MG capsule, Take 1 capsule (500 mg total) by mouth 3 (three) times daily., Disp: 21 capsule, Rfl: 0  Objective Blood pressure 135/69, pulse 84. General WDWN female NAD Vulva:  atrophic severe no lesions Vagina:  atrophic sever no lesions Cervix:  absent Uterus:  absent Bimanual exam is normal  no masses or tenderness is appreciated    Assessment/Plan:   ICD-10-CM   1. Dysuria: urine culture is pending  R30.0 POC Urinalysis Dipstick OB    Urine Culture    2. Vulovaginal irritation  N89.8     3. Vaginal pain, left sidewall, normal exam except apex prolapse: trial of vaginal valium to see response  R10.2             Medications prescribed during  this encounter: Meds ordered this encounter  Medications   Urelle (URELLE/URISED) 81 MG TABS tablet    Sig: Take 1 tablet (81 mg total) by mouth 3 (three) times daily.    Dispense:  90 tablet    Refill:  1   diazepam (VALIUM) 5 MG tablet    Sig: 1 tablet per vagina at bedtime    Dispense:  30 tablet    Refill:  1    Labs or Scans Ordered during this encounter: Orders Placed This Encounter  Procedures   Urine Culture   Specimen status report   POC Urinalysis Dipstick OB      Follow up Return if symptoms worsen or fail to improve.

## 2023-07-17 ENCOUNTER — Ambulatory Visit: Payer: Medicare Other | Admitting: Obstetrics & Gynecology

## 2023-07-17 DIAGNOSIS — R102 Pelvic and perineal pain: Secondary | ICD-10-CM

## 2023-07-17 DIAGNOSIS — N898 Other specified noninflammatory disorders of vagina: Secondary | ICD-10-CM | POA: Diagnosis not present

## 2023-07-17 MED ORDER — DIAZEPAM 5 MG PO TABS: ORAL_TABLET | ORAL | 5 refills | Status: AC

## 2023-07-17 NOTE — Progress Notes (Signed)
 Chief Complaint  Patient presents with   Follow-up      89 y.o. G4P4 No LMP recorded. Patient has had a hysterectomy. The current method of family planning is status post hysterectomy.  Outpatient Encounter Medications as of 07/17/2023  Medication Sig   acetaminophen (TYLENOL) 325 MG tablet Take 2 tablets (650 mg total) by mouth in the morning, at noon, and at bedtime.   ALPRAZolam (XANAX) 0.5 MG tablet Take 0.5 mg by mouth See admin instructions. Take 0.5 mg (1 tablet) nightly at bedtime. Takes an additional 0.5 mg during the day if needed for anxiety.   amLODipine (NORVASC) 10 MG tablet Take 0.5 tablets (5 mg total) by mouth daily in the afternoon.   cephALEXin (KEFLEX) 500 MG capsule Take 1 capsule (500 mg total) by mouth 3 (three) times daily.   clobetasol ointment (TEMOVATE) 0.05 % Apply 1 Application topically 2 (two) times daily.   isosorbide mononitrate (IMDUR) 60 MG 24 hr tablet Take 1 tablet (60 mg total) by mouth 2 (two) times daily.   metoprolol succinate (TOPROL XL) 25 MG 24 hr tablet Take 1 tablet (25 mg total) by mouth daily.   Multiple Vitamins-Minerals (ONE A DAY WOMEN 50 PLUS) TABS Take 1 tablet by mouth daily.   nitroGLYCERIN (NITROSTAT) 0.4 MG SL tablet Place 1 tablet (0.4 mg total) under the tongue every 5 (five) minutes as needed for chest pain.   rosuvastatin (CRESTOR) 5 MG tablet Take 1 tablet (5 mg total) by mouth at bedtime.   sodium bicarbonate 650 MG tablet Take 325 mg by mouth 3 (three) times daily.   sulfamethoxazole-trimethoprim (BACTRIM DS) 800-160 MG tablet Take 1 tablet by mouth 2 (two) times daily.   Urelle (URELLE/URISED) 81 MG TABS tablet Take 1 tablet (81 mg total) by mouth 3 (three) times daily.   [DISCONTINUED] diazepam (VALIUM) 5 MG tablet 1 tablet per vagina at bedtime   diazepam (VALIUM) 5 MG tablet 1 tablet per vagina at bedtime   No facility-administered encounter medications on file as of 07/17/2023.    Subjective Same  symtpoims Better on vaginal valium Past Medical History:  Diagnosis Date   Anemia    Anxiety    Arthritis    Bleeding from the nose Oct 2014   CAD (coronary artery disease)    a. s/p rotational atherectomy and DES placement to LAD in 2012 b. low-risk NST in 08/2018   Cataract of both eyes    Chronic kidney disease    Colon polyps    Hyperlipidemia    Hypertension    x 5-6 yrs.   LBBB (left bundle branch block)    Renal insufficiency     Past Surgical History:  Procedure Laterality Date   BREAST SURGERY     masses removed while she was pregnant   CARDIAC CATHETERIZATION  06/20/2008   severe single vessel high-grade stenosis of mid LAD at bifurcation of diagonal 1 and setpal perforator 1; diffuse coronary calcification of L coronary system; 50% stenosis of mid RCA (Dr. Evlyn Courier)   CATARACT EXTRACTION W/PHACO Right 10/10/2013   Procedure: CATARACT EXTRACTION PHACO AND INTRAOCULAR LENS PLACEMENT (IOC);  Surgeon: Gemma Payor, MD;  Location: [facility];  Service: Ophthalmology;  Laterality: Right;  CDE:11.96   CATARACT EXTRACTION W/PHACO Left 11/18/2013   Procedure: CATARACT EXTRACTION PHACO AND INTRAOCULAR LENS PLACEMENT (IOC);  Surgeon: Gemma Payor, MD;  Location: [facility];  Service: Ophthalmology;  Laterality: Left;  CDE 15.96   COLONOSCOPY N/A 01/16/2013  Procedure: COLONOSCOPY;  Surgeon: Malissa Hippo, MD; igmoid colon diverticulosis as well as changes of diverticulitis involving one diverticulum. She also had external hemorrhoids.   CORONARY ANGIOPLASTY WITH STENT PLACEMENT  11/08/2010   complex high-speed rotational atherectomy of calcified LAD, diffusely disease LAD system (Dr. Bishop Limbo)   EXCISION ORAL TUMOR Right 01/14/2014   Procedure: EXCISION OROPHARYNGEAL MASS;  Surgeon: Darletta Moll, MD;  Location: [facility];  Service: ENT;  Laterality: Right;   NM MYOCAR PERF WALL MOTION  05/10/2011   lexiscan myoview; normal pattern of perfusion in all regions; post-stress EF  55%; low risk scan    PARTIAL HYSTERECTOMY     TONSILLECTOMY     TRANSTHORACIC ECHOCARDIOGRAM  10/08/2010   EF 45-50%, mod conc LVH, grade 1 diastolic dysfunction, mildly calcified left AV cusp; calcified MV annulus    OB History     Gravida  4   Para  4   Term      Preterm      AB      Living  3      SAB      IAB      Ectopic      Multiple      Live Births              Allergies  Allergen Reactions   Bactrim [Sulfamethoxazole-Trimethoprim] Hives   Macrobid [Nitrofurantoin] Nausea And Vomiting    Social History   Socioeconomic History   Marital status: Married    Spouse name: Not on file   Number of children: 4   Years of education: 8   Highest education level: Not on file  Occupational History   Not on file  Tobacco Use   Smoking status: Never   Smokeless tobacco: Never  Vaping Use   Vaping status: Never Used  Substance and Sexual Activity   Alcohol use: No   Drug use: No   Sexual activity: Yes    Birth control/protection: Surgical  Other Topics Concern   Not on file  Social History Narrative   Not on file   Social Drivers of Health   Financial Resource Strain: Not on file  Food Insecurity: No Food Insecurity (03/05/2023)   Hunger Vital Sign    Worried About Running Out of Food in the Last Year: Never true    Ran Out of Food in the Last Year: Never true  Transportation Needs: Not on file  Physical Activity: Not on file  Stress: Not on file  Social Connections: Not on file    Family History  Problem Relation Age of Onset   Liver cancer Mother    Cancer Father    Heart Problems Brother    Coronary artery disease Sister        stent   Heart Problems Daughter        also stomach problems   COPD Daughter    Colon cancer Neg Hx     Medications:       Current Outpatient Medications:    acetaminophen (TYLENOL) 325 MG tablet, Take 2 tablets (650 mg total) by mouth in the morning, at noon, and at bedtime., Disp: , Rfl:     ALPRAZolam (XANAX) 0.5 MG tablet, Take 0.5 mg by mouth See admin instructions. Take 0.5 mg (1 tablet) nightly at bedtime. Takes an additional 0.5 mg during the day if needed for anxiety., Disp: , Rfl:    amLODipine (NORVASC) 10 MG tablet, Take 0.5 tablets (5 mg  total) by mouth daily in the afternoon., Disp: , Rfl:    cephALEXin (KEFLEX) 500 MG capsule, Take 1 capsule (500 mg total) by mouth 3 (three) times daily., Disp: 21 capsule, Rfl: 0   clobetasol ointment (TEMOVATE) 0.05 %, Apply 1 Application topically 2 (two) times daily., Disp: 30 g, Rfl: 3   isosorbide mononitrate (IMDUR) 60 MG 24 hr tablet, Take 1 tablet (60 mg total) by mouth 2 (two) times daily., Disp: , Rfl:    metoprolol succinate (TOPROL XL) 25 MG 24 hr tablet, Take 1 tablet (25 mg total) by mouth daily., Disp: 90 tablet, Rfl: 3   Multiple Vitamins-Minerals (ONE A DAY WOMEN 50 PLUS) TABS, Take 1 tablet by mouth daily., Disp: , Rfl:    nitroGLYCERIN (NITROSTAT) 0.4 MG SL tablet, Place 1 tablet (0.4 mg total) under the tongue every 5 (five) minutes as needed for chest pain., Disp: 25 tablet, Rfl: 3   rosuvastatin (CRESTOR) 5 MG tablet, Take 1 tablet (5 mg total) by mouth at bedtime., Disp: , Rfl:    sodium bicarbonate 650 MG tablet, Take 325 mg by mouth 3 (three) times daily., Disp: , Rfl:    sulfamethoxazole-trimethoprim (BACTRIM DS) 800-160 MG tablet, Take 1 tablet by mouth 2 (two) times daily., Disp: 14 tablet, Rfl: 0   Urelle (URELLE/URISED) 81 MG TABS tablet, Take 1 tablet (81 mg total) by mouth 3 (three) times daily., Disp: 90 tablet, Rfl: 1   diazepam (VALIUM) 5 MG tablet, 1 tablet per vagina at bedtime, Disp: 30 tablet, Rfl: 5  Objective Blood pressure (!) 150/64, pulse 79.  Exam is consistent with the pubic fx seen on x ray in 10/24 but responding to the vaginal valium  Pertinent ROS No new  Labs or studies     Impression + Management Plan: Diagnoses this Encounter::   ICD-10-CM   1. Vulovaginal irritation  N89.8  diazepam (VALIUM) 5 MG tablet    Urine Culture    2. Vaginal pain, left sidewall, normal exam except apex prolapse: trial of vaginal valium to see response  R10.2 diazepam (VALIUM) 5 MG tablet    Urine Culture        Medications prescribed during  this encounter: Meds ordered this encounter  Medications   diazepam (VALIUM) 5 MG tablet    Sig: 1 tablet per vagina at bedtime    Dispense:  30 tablet    Refill:  5    Labs or Scans Ordered during this encounter: Orders Placed This Encounter  Procedures   Urine Culture   Specimen status report      Follow up Return if symptoms worsen or fail to improve.

## 2023-07-19 LAB — URINE CULTURE

## 2023-07-19 LAB — SPECIMEN STATUS REPORT

## 2023-07-25 DIAGNOSIS — M25561 Pain in right knee: Secondary | ICD-10-CM | POA: Diagnosis not present

## 2023-07-25 DIAGNOSIS — E538 Deficiency of other specified B group vitamins: Secondary | ICD-10-CM | POA: Diagnosis not present

## 2023-08-11 ENCOUNTER — Other Ambulatory Visit: Payer: Self-pay | Admitting: Cardiology

## 2023-08-16 DIAGNOSIS — N189 Chronic kidney disease, unspecified: Secondary | ICD-10-CM | POA: Diagnosis not present

## 2023-08-16 DIAGNOSIS — R809 Proteinuria, unspecified: Secondary | ICD-10-CM | POA: Diagnosis not present

## 2023-08-16 DIAGNOSIS — E211 Secondary hyperparathyroidism, not elsewhere classified: Secondary | ICD-10-CM | POA: Diagnosis not present

## 2023-08-16 DIAGNOSIS — D631 Anemia in chronic kidney disease: Secondary | ICD-10-CM | POA: Diagnosis not present

## 2023-08-16 DIAGNOSIS — D649 Anemia, unspecified: Secondary | ICD-10-CM | POA: Diagnosis not present

## 2023-08-22 DIAGNOSIS — M25561 Pain in right knee: Secondary | ICD-10-CM | POA: Diagnosis not present

## 2023-08-24 DIAGNOSIS — I129 Hypertensive chronic kidney disease with stage 1 through stage 4 chronic kidney disease, or unspecified chronic kidney disease: Secondary | ICD-10-CM | POA: Diagnosis not present

## 2023-08-24 DIAGNOSIS — N184 Chronic kidney disease, stage 4 (severe): Secondary | ICD-10-CM | POA: Diagnosis not present

## 2023-08-24 DIAGNOSIS — N2581 Secondary hyperparathyroidism of renal origin: Secondary | ICD-10-CM | POA: Diagnosis not present

## 2023-08-24 DIAGNOSIS — E538 Deficiency of other specified B group vitamins: Secondary | ICD-10-CM | POA: Diagnosis not present

## 2023-08-24 DIAGNOSIS — R809 Proteinuria, unspecified: Secondary | ICD-10-CM | POA: Diagnosis not present

## 2023-08-28 ENCOUNTER — Other Ambulatory Visit: Payer: Self-pay | Admitting: Cardiology

## 2023-08-30 ENCOUNTER — Telehealth: Payer: Self-pay | Admitting: Student

## 2023-08-30 MED ORDER — ISOSORBIDE MONONITRATE ER 60 MG PO TB24: 60.0000 mg | ORAL_TABLET | Freq: Two times a day (BID) | ORAL | 3 refills | Status: AC

## 2023-08-30 NOTE — Telephone Encounter (Signed)
*  STAT* If patient is at the pharmacy, call can be transferred to refill team.   1. Which medications need to be refilled? (please list name of each medication and dose if known)   isosorbide mononitrate (IMDUR) 60 MG 24 hr tablet    2. Which pharmacy/location (including street and city if local pharmacy) is medication to be sent to?  [place], [place] [address]    3. Do they need a 30 day or 90 day supply? [place]

## 2023-08-30 NOTE — Telephone Encounter (Signed)
 Refill request completed.

## 2023-09-20 DIAGNOSIS — M25561 Pain in right knee: Secondary | ICD-10-CM | POA: Diagnosis not present

## 2023-09-20 DIAGNOSIS — M5416 Radiculopathy, lumbar region: Secondary | ICD-10-CM | POA: Diagnosis not present

## 2023-09-25 ENCOUNTER — Other Ambulatory Visit (HOSPITAL_COMMUNITY): Payer: Self-pay | Admitting: Family Medicine

## 2023-09-25 DIAGNOSIS — Z6824 Body mass index (BMI) 24.0-24.9, adult: Secondary | ICD-10-CM | POA: Diagnosis not present

## 2023-09-25 DIAGNOSIS — E538 Deficiency of other specified B group vitamins: Secondary | ICD-10-CM | POA: Diagnosis not present

## 2023-09-25 DIAGNOSIS — R102 Pelvic and perineal pain: Secondary | ICD-10-CM

## 2023-09-26 ENCOUNTER — Ambulatory Visit
Admission: RE | Admit: 2023-09-26 | Discharge: 2023-09-26 | Disposition: A | Discharge status: Home / Self Care | Source: Ambulatory Visit | Attending: Family Medicine | Admitting: Family Medicine

## 2023-09-26 ENCOUNTER — Ambulatory Visit: Payer: Medicare Other | Admitting: Cardiology

## 2023-09-26 ENCOUNTER — Encounter: Payer: Self-pay | Admitting: Cardiology

## 2023-09-26 VITALS — BP 144/65 | HR 65 | Ht 61.5 in | Wt 141.8 lb

## 2023-09-26 DIAGNOSIS — K449 Diaphragmatic hernia without obstruction or gangrene: Secondary | ICD-10-CM | POA: Diagnosis not present

## 2023-09-26 DIAGNOSIS — I2510 Atherosclerotic heart disease of native coronary artery without angina pectoris: Secondary | ICD-10-CM | POA: Insufficient documentation

## 2023-09-26 DIAGNOSIS — E782 Mixed hyperlipidemia: Secondary | ICD-10-CM | POA: Insufficient documentation

## 2023-09-26 DIAGNOSIS — R102 Pelvic and perineal pain: Secondary | ICD-10-CM | POA: Insufficient documentation

## 2023-09-26 DIAGNOSIS — I10 Essential (primary) hypertension: Secondary | ICD-10-CM

## 2023-09-26 DIAGNOSIS — K5730 Diverticulosis of large intestine without perforation or abscess without bleeding: Secondary | ICD-10-CM | POA: Diagnosis not present

## 2023-09-26 DIAGNOSIS — R002 Palpitations: Secondary | ICD-10-CM | POA: Diagnosis not present

## 2023-09-26 DIAGNOSIS — R109 Unspecified abdominal pain: Secondary | ICD-10-CM | POA: Diagnosis not present

## 2023-09-26 DIAGNOSIS — R1900 Intra-abdominal and pelvic swelling, mass and lump, unspecified site: Secondary | ICD-10-CM | POA: Diagnosis not present

## 2023-09-26 MED ORDER — ASPIRIN 81 MG PO TBEC: 81.0000 mg | DELAYED_RELEASE_TABLET | Freq: Every day | ORAL | 3 refills | Status: AC

## 2023-09-26 NOTE — Patient Instructions (Addendum)
 Medication Instructions:  Your physician has recommended you make the following change in your medication:   -Start Aspirin 81 mg once daily   *If you need a refill on your cardiac medications before your next appointment, please call your pharmacy*  Lab Work: None If you have labs (blood work) drawn today and your tests are completely normal, you will receive your results only by: MyChart Message (if you have MyChart) OR A paper copy in the mail If you have any lab test that is abnormal or we need to change your treatment, we will call you to review the results.  Testing/Procedures: None  Follow-Up: At [facility], you and your health needs are our priority.  As part of our continuing mission to provide you with exceptional heart care, our providers are all part of one team.  This team includes your primary Cardiologist (physician) and Advanced Practice Providers or APPs (Physician Assistants and Nurse Practitioners) who all work together to provide you with the care you need, when you need it.  Your next appointment:   6 month(s)  Provider:   You may see Armida Lander, MD or one of the following Advanced Practice Providers on your designated Care Team:   Woodfin Hays, PA-C  Scotesia Le Grand, [place] Theotis Flake, [place]     We recommend signing up for the patient portal called "MyChart".  Sign up information is provided on this After Visit Summary.  MyChart is used to connect with patients for Virtual Visits (Telemedicine).  Patients are able to view lab/test results, encounter notes, upcoming appointments, etc.  Non-urgent messages can be sent to your provider as well.   To learn more about what you can do with MyChart, go to [email or web address].   Other Instructions

## 2023-09-26 NOTE — Progress Notes (Signed)
 Clinical Summary [name] is a 89 y.o.female seen today for follow up of the following medical problems.       This is a focused visit on recent symptoms of fatigue, palpitations, dizziness   1.Fatigue/palpitatons/dizziness - several months of feeling fatigue, low energy - two falls recently, from standing up from bed - Some lightheadedness/dizziness, dizziness when she works gets up in the morning. Some episodes with heart fluttering with activities - 3-4 glasses 16 oz - takes xanax  1/2 tablet at lunch, full one at night     07/2022 monitor was benign -last visit we lowered her toprol  back to 50mg  daily.  - chronic fatigue and weakness.   - no significant palpitations -compliant wit meds       2. CAD - history of PCI to LAD - underwent a low risk nuclear stress test on 08/29/2018, EF 60%.     07/2021 echo: LVEF 60-65%, no WMAs, grade I dd, normal, low normal RV function, severe LAE - denies any chest pains.     2. Chronic LBBB     3. HTN - compliant with meds - recent chagnes make by neprhology, she reports bp's got too low and went back to prior regimen.   - has not taken bp meds yet - reports at pcp office yesterday 121/60   4. Hyperlipidemia - compliant with meds     5. CKD IV - followed by Dr Carrolyn Clan - she reports considering dialysis       Husband with home hospice, liver cancer.  Past Medical History:  Diagnosis Date   Anemia    Anxiety    Arthritis    Bleeding from the nose Oct 2014   CAD (coronary artery disease)    a. s/p rotational atherectomy and DES placement to LAD in 2012 b. low-risk NST in 08/2018   Cataract of both eyes    Chronic kidney disease    Colon polyps    Hyperlipidemia    Hypertension    x 5-6 yrs.   LBBB (left bundle Seanna Sisler block)    Renal insufficiency      Allergies  Allergen Reactions   Bactrim  [Sulfamethoxazole -Trimethoprim ] Hives   Macrobid [Nitrofurantoin] Nausea And Vomiting     Current  Outpatient Medications  Medication Sig Dispense Refill   acetaminophen  (TYLENOL ) 325 MG tablet Take 2 tablets (650 mg total) by mouth in the morning, at noon, and at bedtime.     ALPRAZolam  (XANAX ) 0.5 MG tablet Take 0.5 mg by mouth See admin instructions. Take 0.5 mg (1 tablet) nightly at bedtime. Takes an additional 0.5 mg during the day if needed for anxiety.     amLODipine  (NORVASC ) 10 MG tablet Take 0.5 tablets (5 mg total) by mouth daily in the afternoon.     aspirin EC 81 MG tablet Take 1 tablet (81 mg total) by mouth daily. Swallow whole. 90 tablet 3   clobetasol  ointment (TEMOVATE ) 0.05 % Apply 1 Application topically 2 (two) times daily. 30 g 3   diazepam  (VALIUM ) 5 MG tablet 1 tablet per vagina at bedtime 30 tablet 5   furosemide (LASIX) 40 MG tablet Take 40 mg by mouth daily.     isosorbide  mononitrate (IMDUR ) 60 MG 24 hr tablet Take 1 tablet (60 mg total) by mouth 2 (two) times daily. 180 tablet 3   metoprolol  succinate (TOPROL  XL) 25 MG 24 hr tablet Take 1 tablet (25 mg total) by mouth daily. 90 tablet 3   Multiple  Vitamins-Minerals (ONE A DAY WOMEN 50 PLUS) TABS Take 1 tablet by mouth daily.     nitroGLYCERIN  (NITROSTAT ) 0.4 MG SL tablet Place 1 tablet (0.4 mg total) under the tongue every 5 (five) minutes as needed for chest pain. 25 tablet 3   rosuvastatin  (CRESTOR ) 5 MG tablet Take 1 tablet (5 mg total) by mouth at bedtime.     sodium bicarbonate  650 MG tablet Take 325 mg by mouth 3 (three) times daily.     Urelle  (URELLE /URISED) 81 MG TABS tablet Take 1 tablet (81 mg total) by mouth 3 (three) times daily. 90 tablet 1   valsartan (DIOVAN) 40 MG tablet Take 40 mg by mouth daily.     No current facility-administered medications for this visit.     Past Surgical History:  Procedure Laterality Date   BREAST SURGERY     masses removed while she was pregnant   CARDIAC CATHETERIZATION  06/20/2008   severe single vessel high-grade stenosis of mid LAD at bifurcation of diagonal 1  and setpal perforator 1; diffuse coronary calcification of L coronary system; 50% stenosis of mid RCA (Dr. Jammie Mccune)   CATARACT EXTRACTION W/PHACO Right 10/10/2013   Procedure: CATARACT EXTRACTION PHACO AND INTRAOCULAR LENS PLACEMENT (IOC);  Surgeon: Anner Kill, MD;  Location: [facility];  Service: Ophthalmology;  Laterality: Right;  CDE:11.96   CATARACT EXTRACTION W/PHACO Left 11/18/2013   Procedure: CATARACT EXTRACTION PHACO AND INTRAOCULAR LENS PLACEMENT (IOC);  Surgeon: Anner Kill, MD;  Location: [facility];  Service: Ophthalmology;  Laterality: Left;  CDE 15.96   COLONOSCOPY N/A 01/16/2013   Procedure: COLONOSCOPY;  Surgeon: Ruby Corporal, MD; igmoid colon diverticulosis as well as changes of diverticulitis involving one diverticulum. She also had external hemorrhoids.   CORONARY ANGIOPLASTY WITH STENT PLACEMENT  11/08/2010   complex high-speed rotational atherectomy of calcified LAD, diffusely disease LAD system (Dr. Electa Grieve)   EXCISION ORAL TUMOR Right 01/14/2014   Procedure: EXCISION OROPHARYNGEAL MASS;  Surgeon: Lawence Press, MD;  Location: [facility];  Service: ENT;  Laterality: Right;   NM MYOCAR PERF WALL MOTION  05/10/2011   lexiscan  myoview ; normal pattern of perfusion in all regions; post-stress EF 55%; low risk scan    PARTIAL HYSTERECTOMY     TONSILLECTOMY     TRANSTHORACIC ECHOCARDIOGRAM  10/08/2010   EF 45-50%, mod conc LVH, grade 1 diastolic dysfunction, mildly calcified left AV cusp; calcified MV annulus     Allergies  Allergen Reactions   Bactrim  [Sulfamethoxazole -Trimethoprim ] Hives   Macrobid [Nitrofurantoin] Nausea And Vomiting      Family History  Problem Relation Age of Onset   Liver cancer Mother    Cancer Father    Heart Problems Brother    Coronary artery disease Sister        stent   Heart Problems Daughter        also stomach problems   COPD Daughter    Colon cancer Neg Hx      Social History [name] reports that she has never  smoked. She has never used smokeless tobacco. [name] reports no history of alcohol use.   Review of Systems CONSTITUTIONAL: No weight loss, fever, chills, weakness or fatigue.  HEENT: Eyes: No visual loss, blurred vision, double vision or yellow sclerae.No hearing loss, sneezing, congestion, runny nose or sore throat.  SKIN: No rash or itching.  CARDIOVASCULAR:  RESPIRATORY: No shortness of breath, cough or sputum.  GASTROINTESTINAL: No anorexia, nausea, vomiting or diarrhea. No abdominal  pain or blood.  GENITOURINARY: No burning on urination, no polyuria NEUROLOGICAL: No headache, dizziness, syncope, paralysis, ataxia, numbness or tingling in the extremities. No change in bowel or bladder control.  MUSCULOSKELETAL: No muscle, back pain, joint pain or stiffness.  LYMPHATICS: No enlarged nodes. No history of splenectomy.  PSYCHIATRIC: No history of depression or anxiety.  ENDOCRINOLOGIC: No reports of sweating, cold or heat intolerance. No polyuria or polydipsia.  Aaron Aas   Physical Examination Vitals:   09/26/23 1325 09/26/23 1408  BP: (!) 150/70 (!) 144/65  Pulse: 65   SpO2: 95%    Filed Weights   09/26/23 1325  Weight: 141 lb 12.8 oz (64.3 kg)    Gen: resting comfortably, no acute distress HEENT: no scleral icterus, pupils equal round and reactive, no palptable cervical adenopathy,  CV Resp: Clear to auscultation bilaterally GI: abdomen is soft, non-tender, non-distended, normal bowel sounds, no hepatosplenomegaly MSK: extremities are warm, no edema.  Skin: warm, no rash Neuro:  no focal deficits Psych: appropriate affect   Diagnostic Studies 07/2021 echo   IMPRESSIONS     1. Left ventricular ejection fraction, by estimation, is 60 to 65%. The  left ventricle has normal function. The left ventricle has no regional  wall motion abnormalities. There is moderate left ventricular hypertrophy.  Left ventricular diastolic  parameters are consistent with Grade I diastolic  dysfunction (impaired  relaxation).   2. Right ventricular systolic function is low normal. The right  ventricular size is normal. There is normal pulmonary artery systolic  pressure. The estimated right ventricular systolic pressure is 21.7 mmHg.   3. Left atrial size was severely dilated.   4. A small pericardial effusion is present. The pericardial effusion is  circumferential. There is no evidence of cardiac tamponade.   5. The mitral valve is abnormal. Trivial mitral valve regurgitation.   6. The aortic valve is tricuspid. Aortic valve regurgitation is not  visualized.   7. The inferior vena cava is normal in size with greater than 50%  respiratory variability, suggesting right atrial pressure of 3 mmHg.       Assessment and Plan   1. Palpitations - denies significant symptoms, continue current meds  2. CAD  -no recent symptoms, she had stopped her 81mg  ASA on her own, we have asked to restart  3. HLD - LDL at goal, continue current meds  4.HTN - reasonable control given advanced age and prior issues with dizziness, continue current meds     Laurann Pollock, M.D.,

## 2023-10-12 DIAGNOSIS — R809 Proteinuria, unspecified: Secondary | ICD-10-CM | POA: Diagnosis not present

## 2023-10-12 DIAGNOSIS — D631 Anemia in chronic kidney disease: Secondary | ICD-10-CM | POA: Diagnosis not present

## 2023-10-12 DIAGNOSIS — N189 Chronic kidney disease, unspecified: Secondary | ICD-10-CM | POA: Diagnosis not present

## 2023-10-12 DIAGNOSIS — E119 Type 2 diabetes mellitus without complications: Secondary | ICD-10-CM | POA: Diagnosis not present

## 2023-10-12 DIAGNOSIS — I10 Essential (primary) hypertension: Secondary | ICD-10-CM | POA: Diagnosis not present

## 2023-10-19 DIAGNOSIS — N184 Chronic kidney disease, stage 4 (severe): Secondary | ICD-10-CM | POA: Diagnosis not present

## 2023-10-19 DIAGNOSIS — I129 Hypertensive chronic kidney disease with stage 1 through stage 4 chronic kidney disease, or unspecified chronic kidney disease: Secondary | ICD-10-CM | POA: Diagnosis not present

## 2023-10-19 DIAGNOSIS — R809 Proteinuria, unspecified: Secondary | ICD-10-CM | POA: Diagnosis not present

## 2023-10-19 DIAGNOSIS — D510 Vitamin B12 deficiency anemia due to intrinsic factor deficiency: Secondary | ICD-10-CM | POA: Diagnosis not present

## 2023-10-19 DIAGNOSIS — N2581 Secondary hyperparathyroidism of renal origin: Secondary | ICD-10-CM | POA: Diagnosis not present

## 2023-10-27 DIAGNOSIS — M25561 Pain in right knee: Secondary | ICD-10-CM | POA: Diagnosis not present

## 2023-10-31 DIAGNOSIS — M25561 Pain in right knee: Secondary | ICD-10-CM | POA: Diagnosis not present

## 2023-11-01 ENCOUNTER — Encounter (HOSPITAL_COMMUNITY): Payer: Self-pay | Admitting: Family Medicine

## 2023-11-01 DIAGNOSIS — Z1231 Encounter for screening mammogram for malignant neoplasm of breast: Secondary | ICD-10-CM

## 2023-11-02 ENCOUNTER — Other Ambulatory Visit (HOSPITAL_COMMUNITY): Payer: Self-pay | Admitting: Family Medicine

## 2023-11-02 DIAGNOSIS — Z1231 Encounter for screening mammogram for malignant neoplasm of breast: Secondary | ICD-10-CM

## 2023-11-13 ENCOUNTER — Encounter: Payer: Self-pay | Admitting: Urology

## 2023-11-13 ENCOUNTER — Ambulatory Visit: Admitting: Urology

## 2023-11-13 VITALS — BP 154/69 | HR 55

## 2023-11-13 DIAGNOSIS — N952 Postmenopausal atrophic vaginitis: Secondary | ICD-10-CM

## 2023-11-13 DIAGNOSIS — R102 Pelvic and perineal pain: Secondary | ICD-10-CM | POA: Diagnosis not present

## 2023-11-13 DIAGNOSIS — Z638 Other specified problems related to primary support group: Secondary | ICD-10-CM | POA: Insufficient documentation

## 2023-11-13 DIAGNOSIS — N3021 Other chronic cystitis with hematuria: Secondary | ICD-10-CM | POA: Diagnosis not present

## 2023-11-13 DIAGNOSIS — N3941 Urge incontinence: Secondary | ICD-10-CM

## 2023-11-13 DIAGNOSIS — D510 Vitamin B12 deficiency anemia due to intrinsic factor deficiency: Secondary | ICD-10-CM | POA: Diagnosis not present

## 2023-11-13 DIAGNOSIS — N390 Urinary tract infection, site not specified: Secondary | ICD-10-CM

## 2023-11-13 LAB — URINALYSIS, ROUTINE W REFLEX MICROSCOPIC
Bilirubin, UA: NEGATIVE
Glucose, UA: NEGATIVE
Ketones, UA: NEGATIVE
Nitrite, UA: NEGATIVE
Specific Gravity, UA: 1.02 (ref 1.005–1.030)
Urobilinogen, Ur: 1 mg/dL (ref 0.2–1.0)
pH, UA: 6 (ref 5.0–7.5)

## 2023-11-13 LAB — MICROSCOPIC EXAMINATION: Bacteria, UA: NONE SEEN

## 2023-11-13 LAB — BLADDER SCAN AMB NON-IMAGING: Scan Result: 0

## 2023-11-13 MED ORDER — ESTRADIOL 0.1 MG/GM VA CREA: TOPICAL_CREAM | VAGINAL | 3 refills | Status: DC

## 2023-11-13 MED ORDER — CEPHALEXIN 250 MG PO CAPS: 250.0000 mg | ORAL_CAPSULE | Freq: Every day | ORAL | 5 refills | Status: DC

## 2023-11-13 NOTE — Progress Notes (Signed)
 Bladder Scan completed today.  Patient can void prior to the bladder scan. Bladder scan result: 0ml  Performed By: Guss Bunde, CMA  Additional notes-  NP to see after

## 2023-11-13 NOTE — Progress Notes (Signed)
 Name: [name] DOB: 28-May-1933 MRN: [id number]  History of Present Illness: [name] is a 89 y.o. female who presents today to re-establish care at [facility]. Relevant history includes: 1. Recurrent UTI / chronic cystitis. 2. Vaginal atrophy (severe per GYN provider). 3. Urge urinary incontinence. 4. History of pelvic fracture in October 2024 with sequelae of chronic pelvic pain/pressure. Per Dr. Jayne (GYN) note on 07/17/2023: responding to the vaginal valium  (1 tablet per vagina at bedtime).   Urine culture results in past 12 months: - 01/02/2023: Negative - 01/23/2023: Negative - 06/23/2023: Positive for Klebsiella pneumoniae - 07/17/2023: Negative - 09/25/2023: Positive for E. coli  At last visit with Dr. Sherrilee on 10/09/2020: The plan was to continue Trimethoprim  100mg  at bedtime for UTI prophylaxis.  Since last visit: > 09/26/2023: CT abdomen/pelvis w/o contrast showed Punctate, nonobstructing left upper pole intrarenal calculus. No hydronephrosis. Partially decompressed urinary bladder with perivesicular fat stranding. Had a UTI at that time (urine culture positive for E. Coli).  Today: She reports that for quite a while (close to a year) she has been having intermittent bladder / urethral burning and throbbing pain. She denies daytime urinary urgency or frequency. Reports nocturia since she started taking a diuretic every afternoon. Denies dysuria, gross hematuria, straining to void, or sensations of incomplete emptying.  She reports throbbing vaginal pain. She denies vaginal bleeding, abnormal discharge, itching, dryness. She denies use of topical vaginal estrogen cream for many years. She reports that recently she combined two of her vaginal Valium  suppositories with OTC hydrocortisone  1% cream and found that to be somewhat helpful.   Medications: Current Outpatient Medications  Medication Sig Dispense Refill   acetaminophen  (TYLENOL )  325 MG tablet Take 2 tablets (650 mg total) by mouth in the morning, at noon, and at bedtime.     ALPRAZolam  (XANAX ) 0.5 MG tablet Take 0.5 mg by mouth See admin instructions. Take 0.5 mg (1 tablet) nightly at bedtime. Takes an additional 0.5 mg during the day if needed for anxiety.     amLODipine  (NORVASC ) 10 MG tablet Take 0.5 tablets (5 mg total) by mouth daily in the afternoon.     aspirin  EC 81 MG tablet Take 1 tablet (81 mg total) by mouth daily. Swallow whole. 90 tablet 3   cephALEXin  (KEFLEX ) 250 MG capsule Take 1 capsule (250 mg total) by mouth daily. 30 capsule 5   diazepam  (VALIUM ) 5 MG tablet 1 tablet per vagina at bedtime 30 tablet 5   estradiol  (ESTRACE ) 0.1 MG/GM vaginal cream Discard plastic applicator. Insert a blueberry size amount (approximately 1 gram) of cream on fingertip inside vagina at bedtime every night for 1 week then every other night. For long term use. 30 g 3   furosemide (LASIX) 40 MG tablet Take 40 mg by mouth daily.     isosorbide  mononitrate (IMDUR ) 60 MG 24 hr tablet Take 1 tablet (60 mg total) by mouth 2 (two) times daily. 180 tablet 3   Multiple Vitamins-Minerals (ONE A DAY WOMEN 50 PLUS) TABS Take 1 tablet by mouth daily.     nitroGLYCERIN  (NITROSTAT ) 0.4 MG SL tablet Place 1 tablet (0.4 mg total) under the tongue every 5 (five) minutes as needed for chest pain. 25 tablet 3   rosuvastatin  (CRESTOR ) 5 MG tablet Take 1 tablet (5 mg total) by mouth at bedtime.     sodium bicarbonate  650 MG tablet Take 325 mg by mouth 3 (three) times daily.     Urelle  (  URELLE /URISED) 81 MG TABS tablet Take 1 tablet (81 mg total) by mouth 3 (three) times daily. 90 tablet 1   valsartan (DIOVAN) 40 MG tablet Take 40 mg by mouth daily.     clobetasol  ointment (TEMOVATE ) 0.05 % Apply 1 Application topically 2 (two) times daily. (Patient not taking: Reported on 11/13/2023) 30 g 3   metoprolol  succinate (TOPROL  XL) 25 MG 24 hr tablet Take 1 tablet (25 mg total) by mouth daily. (Patient not  taking: Reported on 11/13/2023) 90 tablet 3   No current facility-administered medications for this visit.    Allergies: Allergies  Allergen Reactions   Bactrim  [Sulfamethoxazole -Trimethoprim ] Hives   Macrobid [Nitrofurantoin] Nausea And Vomiting    Past Medical History:  Diagnosis Date   Anemia    Anxiety    Arthritis    Bleeding from the nose Oct 2014   CAD (coronary artery disease)    a. s/p rotational atherectomy and DES placement to LAD in 2012 b. low-risk NST in 08/2018   Cataract of both eyes    Chronic kidney disease    Colon polyps    Hyperlipidemia    Hypertension    x 5-6 yrs.   LBBB (left bundle branch block)    Renal insufficiency    Past Surgical History:  Procedure Laterality Date   BREAST SURGERY     masses removed while she was pregnant   CARDIAC CATHETERIZATION  06/20/2008   severe single vessel high-grade stenosis of mid LAD at bifurcation of diagonal 1 and setpal perforator 1; diffuse coronary calcification of L coronary system; 50% stenosis of mid RCA (Dr. DOROTHA Schwalbe)   CATARACT EXTRACTION W/PHACO Right 10/10/2013   Procedure: CATARACT EXTRACTION PHACO AND INTRAOCULAR LENS PLACEMENT (IOC);  Surgeon: Cherene Mania, MD;  Location: [facility];  Service: Ophthalmology;  Laterality: Right;  CDE:11.96   CATARACT EXTRACTION W/PHACO Left 11/18/2013   Procedure: CATARACT EXTRACTION PHACO AND INTRAOCULAR LENS PLACEMENT (IOC);  Surgeon: Cherene Mania, MD;  Location: [facility];  Service: Ophthalmology;  Laterality: Left;  CDE 15.96   COLONOSCOPY N/A 01/16/2013   Procedure: COLONOSCOPY;  Surgeon: Claudis RAYMOND Rivet, MD; igmoid colon diverticulosis as well as changes of diverticulitis involving one diverticulum. She also had external hemorrhoids.   CORONARY ANGIOPLASTY WITH STENT PLACEMENT  11/08/2010   complex high-speed rotational atherectomy of calcified LAD, diffusely disease LAD system (Dr. IVAR Sor)   EXCISION ORAL TUMOR Right 01/14/2014   Procedure: EXCISION OROPHARYNGEAL MASS;   Surgeon: Ana LELON Moccasin, MD;  Location: [facility];  Service: ENT;  Laterality: Right;   NM MYOCAR PERF WALL MOTION  05/10/2011   lexiscan  myoview ; normal pattern of perfusion in all regions; post-stress EF 55%; low risk scan    PARTIAL HYSTERECTOMY     TONSILLECTOMY     TRANSTHORACIC ECHOCARDIOGRAM  10/08/2010   EF 45-50%, mod conc LVH, grade 1 diastolic dysfunction, mildly calcified left AV cusp; calcified MV annulus   Family History  Problem Relation Age of Onset   Liver cancer Mother    Cancer Father    Heart Problems Brother    Coronary artery disease Sister        stent   Heart Problems Daughter        also stomach problems   COPD Daughter    Colon cancer Neg Hx    Social History   Socioeconomic History   Marital status: Married    Spouse name: Not on file   Number of children: 4   Years  of education: 8   Highest education level: Not on file  Occupational History   Not on file  Tobacco Use   Smoking status: Never   Smokeless tobacco: Never  Vaping Use   Vaping status: Never Used  Substance and Sexual Activity   Alcohol use: No   Drug use: No   Sexual activity: Yes    Birth control/protection: Surgical  Other Topics Concern   Not on file  Social History Narrative   Not on file   Social Drivers of Health   Financial Resource Strain: Not on file  Food Insecurity: No Food Insecurity (03/05/2023)   Hunger Vital Sign    Worried About Running Out of Food in the Last Year: Never true    Ran Out of Food in the Last Year: Never true  Transportation Needs: Not on file  Physical Activity: Not on file  Stress: Not on file  Social Connections: Not on file  Intimate Partner Violence: Not on file    Review of Systems Constitutional: Patient denies any unintentional weight loss or change in strength lntegumentary: Patient denies any rashes or pruritus Cardiovascular: Patient denies chest pain or syncope Respiratory: Patient denies shortness of  breath Gastrointestinal: Patient denies nausea, vomiting, constipation, or diarrhea  Musculoskeletal: Patient denies muscle cramps or weakness Neurologic: Patient denies convulsions or seizures Allergic/Immunologic: Patient denies recent allergic reaction(s) Hematologic/Lymphatic: Patient denies bleeding tendencies Endocrine: Patient denies heat/cold intolerance  GU: As per HPI.  OBJECTIVE Vitals:   11/13/23 1055  BP: (!) 154/69  Pulse: (!) 55   There is no height or weight on file to calculate BMI.  Physical Examination Constitutional: No obvious distress; patient is non-toxic appearing  Cardiovascular: No visible lower extremity edema.  Respiratory: The patient does not have audible wheezing/stridor; respirations do not appear labored  Gastrointestinal: Abdomen non-distended Musculoskeletal: Normal ROM of UEs  Skin: No obvious rashes/open sores  Neurologic: CN 2-12 grossly intact Psychiatric: Answered questions appropriately with normal affect  Hematologic/Lymphatic/Immunologic: No obvious bruises or sites of spontaneous bleeding  Urine microscopy: unremarkable PVR: 0 ml  ASSESSMENT Recurrent UTI - Plan: estradiol  (ESTRACE ) 0.1 MG/GM vaginal cream, cephALEXin  (KEFLEX ) 250 MG capsule  Chronic cystitis with hematuria - Plan: Urinalysis, Routine w reflex microscopic, BLADDER SCAN AMB NON-IMAGING  Urgency incontinence - Plan: Urinalysis, Routine w reflex microscopic, BLADDER SCAN AMB NON-IMAGING  Atrophic vaginitis - Plan: estradiol  (ESTRACE ) 0.1 MG/GM vaginal cream  Pelvic and perineal pain  Caregiver role strain  Vaginal pain - Plan: estradiol  (ESTRACE ) 0.1 MG/GM vaginal cream  We discussed the possible etiologies of recurrent UTls including ascending infection related to intercourse; vaginal atrophy; transmural infection that has been treated incompletely; urinary tract stones; incomplete bladder emptying with urinary stasis; kidney or bladder tumor; urethral  diverticulum; and colonization of  vagina and urinary tract with pathologic, adherent organisms.   For UTI prevention advised: > Starting topical vaginal estrogen cream. The rationale, appropriate use, and potential pros / cons were discussed in detail.  > UTI prophylaxis with a daily low dose antibiotic for at least the next 3 months (Keflex  250 mg daily).  Handout provided with additional recommendations / options for UTI prevention.   For chronic cystitis even when UTI is absent we discussed how that may be due at least in part to her untreated severe vaginal atrophy irritating the urethra. Advised topical vaginal estrogen cream use and cystoscopy for further evaluation.  Per her report, her nocturia seems directly related to the time of day when she takes  her Lasix (2 pm). Advised her to take that in the mornings instead.  Patient verbalized understanding of and agreement with current plan. All questions were answered.  PLAN Advised the following: 1. Keflex  (Cephalexin ) 250 mg daily. 2. Topical vaginal estrogen cream as prescribed.  3. Take Lasix in mornings.  4. Return for surveillance cystoscopy as scheduled with MD.  Orders Placed This Encounter  Procedures   Urinalysis, Routine w reflex microscopic   BLADDER SCAN AMB NON-IMAGING    It has been explained that the patient is to follow regularly with their PCP in addition to all other providers involved in their care and to follow instructions provided by these respective offices. Patient advised to contact urology clinic if any urologic-pertaining questions, concerns, new symptoms or problems arise in the interim period.  Patient Instructions  UTI prevention / management:  UTI symptoms may include:  - Pain / burning / discomfort when urinating - Recent increase in urinary urgency (how quickly you feel like you need to rush to the bathroom) - Recent increase in urinary frequency (how often you are urinating) - Fever - Acute  mental status change / confusion - Fatigue / Feeling tired - Weakness - Note: Urine color, clarity, and odor are not considered to be clinically significant indicators of UTI and do not warrant urine testing unless patient is also experiencing UTI symptoms such as those listed above.  Difference between Urinalysis (urine dipstick test) and Urine culture / Why urine culture often needed to determine appropriate diagnosis and treatment of urologic symptoms: > Urinalysis (urine dipstick test): A quick office test used as an indicator to determine whether or not further testing is necessary (such as a urine culture, urine microscopy, etc.) The urinalysis cannot differentiate a true bacterial UTI or give a definitive diagnosis for the findings.  > Urine culture: May be performed based on the findings of a urinalysis to evaluate for UTI. Grows out on a petri dish for 48-72 hours. Provides important information about: whether or not bacterial growth is present and if so: what the predominant bacteria is which antibiotics will work best against that bacteria That information is important so that we can diagnose and treat patients appropriately as there are other conditions which may mimic UTls which must not be missed (such as cancer, interstitial cystitis, stones, etc.). Assists us  with antibiotic stewardship to minimize patient's risk for developing antibiotic resistance (getting to a point where no antibiotics work anymore).  Options when UTI symptoms occur: 1. Call [facility] [place] and request to speak with triage nurse (phone # [phone], select option 3). In accordance with clinic guidelines the nurse will determine next steps based on patient-reported symptoms, which may include: same-day lab visit to provide urine specimen, recommendation to schedule Urology office visit appointment for further evaluation, recommendation to proceed to ER, etc. 2. Call your Primary Care Provider  (PCP) office to request urgent / same-day visit. Be sure to request for urine culture to be ordered and have results faxed to Urology (fax # [phone]).  3. Go to urgent care. Be sure to request for urine culture to be ordered and have results faxed to Urology (fax # [phone]).   For bladder pain/ burning with urination: - Can take over-the-counter Pyridium (phenazopyridine; commonly known under the AZO brand) for a few days as needed. Limit use to no more than 3 days consecutively due to risk for methemoglobinemia, liver function issues, and bone health damage with long term use of Pyridium. -  Alternative: Prescription urinary analgesics (such as Uribel, Urogesic blue, Urelle , Uro-MP). Often expensive / poorly covered by insurance unfortunately.  Options / recommendations for UTI prevention: - Low dose antibiotic daily for UTI prophylaxis. - Topical vaginal estrogen for vaginal atrophy (aka Genitourinary Syndrome of Menopause (GSM)). - Adequate daily fluid intake to flush out the urinary tract. - Go to the bathroom to urinate every 4-6 hours while awake to minimize urinary stasis / bacterial overgrowth in the bladder. - Proanthocyanidin (PAC) supplement 36 mg daily; must be soluble (insoluble form of PAC will be ineffective). Recommended brand: Ellura. This is an over-the-counter supplement (often must be found/ purchased online) supplement derived from cranberries with concentrated active component: Proanthocyanidin (PAC) 36 mg daily. Decreases bacterial adherence to bladder lining.  - D-mannose powder (2 grams daily). This is an over-the-counter supplement which decreases bacterial adherence to bladder lining (it is a sugar that inhibits bacterial adherence to urothelial cells by binding to the pili of enteric bacteria). Take as per manufacturer recommendation. Can be used as an alternative or in addition to the concentrated cranberry supplement.  - Vitamin C supplement to acidify urine to  minimize bacterial growth.  - Probiotic to maintain healthy vaginal microbiome to suppress bacteria at urethral opening. Brand recommendations: Verlon (includes probiotic & D-mannose ), Feminine Balance (highest concentration of lactobacillus) or Hyperbiotic Pro 15.  Note for patients with diabetes:  - Be aware that D-mannose contains sugar.  Note for patients with interstitial cystitis (IC):  - Patients with IC should typically avoid cranberry/ PAC supplements and Vitamin C supplements due to their acidity, which may exacerbate IC-related bladder pain. - Symptoms of true bacterial UTI can overlap / mimic symptoms of an IC flare up. Antibiotic use is NOT indicated for IC flare ups. Urine culture needed prior to antibiotic treatment for IC patients. The goal is to minimize your risk for developing antibiotic-resistant bacteria.    Vaginal atrophy I Genitourinary syndrome of menopause (GSM):  What it is: Changes in the vaginal environment (including the vulva and urethra) including: Thinning of the epithelium (skin/ mucosa surface) Can contribute to urinary urgency and frequency Can contribute to dryness, itching, irritation of the vulvar and vaginal tissue Can contribute to pain with intercourse Can contribute to physical changes of the labia, vulva, and vagina such as: Narrowing of the vaginal opening Decreased vaginal length Loss of labial architecture Labial adhesions Pale color of vulvovaginal tissue Loss of pubic hair Allows bacteria to become adherent Results in increased risk for urinary tract infection (UTI) due to bacterial overgrowth and migration up the urethra into the bladder Change in vaginal pH (acid/ base balance) Allows for alteration / disruption of the normal bacterial flora / microbiome Results in increased risk for urinary tract infection (UTI) due to bacterial overgrowth  Treatment options: Over-the-counter lubricants (see list below). Prescription vaginal  estrogen replacement. Options: Topical vaginal estrogen (estradiol ) cream: (Estrace , Premarin , compounded) Apply as directed Estring  vaginal ring Exchanged every 3 months (either at home or in office by provider) Vagifem  vaginal tablet Inserted nightly for 2 weeks then twice a week (long term) lntrarosa vaginal suppository Vaginal DHEA: converts to estrogen in vaginal tissue without systemic effect Inserted nightly (long term) 3. Vaginal laser therapy (Mona Olam touch) Performed in 3 treatments each 6 weeks apart (available in our [place] office). Can feel like a sunburn for 3-4 days after each treatment until new skin heals in. Usually not covered by insurance. Estimated cost is $1500 for all 3 sessions.  FYI  regarding prescription vaginal estrogen treatment options: - All topical vaginal estrogen replacement options are equivalent in terms of efficacy. - Topical vaginal estrogen replacement will take about 3 months to be effective. - OK to have sex with any of the topical vaginal estrogen replacement options. - Topical vaginal estrogen replacement may sting/burn initially due to severe dryness, which will improve with ongoing treatment. - Studies have demonstrated negligible systemic absorption of low-dose intravaginal estrogen cream therefore the risk for cancer development or recurrence with this medication is minimal.  Genitourinary Syndrome of Menopause: AUA/SUFU/AUGS Guideline (2025) [place].at  Topical vaginal estrogen cream safe to use with breast cancer history [email or web address]  Topical vaginal estrogen cream safe to use with blood clot  history [email or web address]   Lubricants and Moisturizers for Treating Genitourinary Syndrome of Menopause and Vulvovaginal Atrophy Treatment Comments I Available Products   Lubricants   Water-based Ingredients: Deionized water, glycerin, propylene glycol; latex safe; rare irritation; dry out with extended sexual activity Astroglide, Good Clean Love, K-Y Jelly, Natural, Organic, Pink, Sliquid, Sylk, Yes    Oil Based Ingredients: avocado, olive, peanut, corn; latex safe; can be used with silicone products; staining; safe (unless peanut allergy); non-irritating Coconut oil, vegetable oil, vitamin E oil  Silicone-Based Ingredients: Silicone polymers; staining; typically nonirritating, long lasting; waterproof; should not be used with silicone dilators, sexual toys, or gynecologic products Astroglide X, Oceanus Ultra Pure, Pink Silicone, Pjur Eros, Replens Silky Smooth, Silicone Premium JO, SKYN, Uberlube, [employer] Based Minimize harm to sperm motility; designed for couples trying to conceive:  Astroglide TTC, Conceive Plus, PreSeed, Yes Baby  Fertility Friendly Minimize harm to sperm motility; designed for couples trying to conceive:  Astroglide, TTC, Conceive Plus, PreSeed, Yes Baby  Vaginal Moisturizers   Vaginal Moisturizers For maintenance use 1 to 3 times weekly; can benefit women with dryness, chafing with AOL, and recurrent vaginal infections irrespective of sexual activity timing Balance Active Menopause Vaginal Moisturizing Lubricant, Canesintima Intimate Moisturizer, Replens, Rephresh, Sylk Natural Intimate Moisturizer, Yes Vaginal Moisturizer  Hybrids Properties of both water and silicone-based products (combination of a vaginal lubricant and moisturizer); Non-irritating; good option for women with allergies and  sensitivities Lubrigyn, Luvena  Suppositories Hyaluronic acid to retain moisture Revaree  Vulvar Soothing Creams/Oils    Medicated Creams Pain and burn relief; Ingredients: 4% Lidocaine , Aloe Vera gel Releveum ([place])  Non-Medicated Creams For anti-itch and moisture/maintenance; Ingredients: Coconut oil, Avocado oil, Shea Butter, Olive oil, Vitamin E Vajuvenate, Vmagic  Oils for moisture/maintenance: Coconut oil, Vitamin E oil, Emu oil     Electronically signed by:  Lauraine JAYSON Oz, FNP   11/13/23    11:28 AM

## 2023-11-13 NOTE — Patient Instructions (Signed)
 UTI prevention / management:  UTI symptoms may include:  - Pain / burning / discomfort when urinating - Recent increase in urinary urgency (how quickly you feel like you need to rush to the bathroom) - Recent increase in urinary frequency (how often you are urinating) - Fever - Acute mental status change / confusion - Fatigue / Feeling tired - Weakness - Note: Urine color, clarity, and odor are not considered to be clinically significant indicators of UTI and do not warrant urine testing unless patient is also experiencing UTI symptoms such as those listed above.  Difference between Urinalysis (urine dipstick test) and Urine culture / Why urine culture often needed to determine appropriate diagnosis and treatment of urologic symptoms: > Urinalysis (urine dipstick test): A quick office test used as an indicator to determine whether or not further testing is necessary (such as a urine culture, urine microscopy, etc.) The urinalysis cannot differentiate a true bacterial UTI or give a definitive diagnosis for the findings.  > Urine culture: May be performed based on the findings of a urinalysis to evaluate for UTI. Grows out on a petri dish for 48-72 hours. Provides important information about: whether or not bacterial growth is present and if so: what the predominant bacteria is which antibiotics will work best against that bacteria That information is important so that we can diagnose and treat patients appropriately as there are other conditions which may mimic UTls which must not be missed (such as cancer, interstitial cystitis, stones, etc.). Assists us  with antibiotic stewardship to minimize patient's risk for developing antibiotic resistance (getting to a point where no antibiotics work anymore).  Options when UTI symptoms occur: 1. Call [facility] [place] and request to speak with triage nurse (phone # [phone], select option 3). In accordance with clinic guidelines  the nurse will determine next steps based on patient-reported symptoms, which may include: same-day lab visit to provide urine specimen, recommendation to schedule Urology office visit appointment for further evaluation, recommendation to proceed to ER, etc. 2. Call your Primary Care Provider (PCP) office to request urgent / same-day visit. Be sure to request for urine culture to be ordered and have results faxed to Urology (fax # [phone]).  3. Go to urgent care. Be sure to request for urine culture to be ordered and have results faxed to Urology (fax # [phone]).   For bladder pain/ burning with urination: - Can take over-the-counter Pyridium (phenazopyridine; commonly known under the AZO brand) for a few days as needed. Limit use to no more than 3 days consecutively due to risk for methemoglobinemia, liver function issues, and bone health damage with long term use of Pyridium. - Alternative: Prescription urinary analgesics (such as Uribel, Urogesic blue, Urelle, Uro-MP). Often expensive / poorly covered by insurance unfortunately.  Options / recommendations for UTI prevention: - Low dose antibiotic daily for UTI prophylaxis. - Topical vaginal estrogen for vaginal atrophy (aka Genitourinary Syndrome of Menopause (GSM)). - Adequate daily fluid intake to flush out the urinary tract. - Go to the bathroom to urinate every 4-6 hours while awake to minimize urinary stasis / bacterial overgrowth in the bladder. - Proanthocyanidin (PAC) supplement 36 mg daily; must be soluble (insoluble form of PAC will be ineffective). Recommended brand: Ellura. This is an over-the-counter supplement (often must be found/ purchased online) supplement derived from cranberries with concentrated active component: Proanthocyanidin (PAC) 36 mg daily. Decreases bacterial adherence to bladder lining.  - D-mannose powder (2 grams daily). This is an over-the-counter supplement  which decreases bacterial adherence to bladder  lining (it is a sugar that inhibits bacterial adherence to urothelial cells by binding to the pili of enteric bacteria). Take as per manufacturer recommendation. Can be used as an alternative or in addition to the concentrated cranberry supplement.  - Vitamin C supplement to acidify urine to minimize bacterial growth.  - Probiotic to maintain healthy vaginal microbiome to suppress bacteria at urethral opening. Brand recommendations: Estill Hemming (includes probiotic & D-mannose ), Feminine Balance (highest concentration of lactobacillus) or Hyperbiotic Pro 15.  Note for patients with diabetes:  - Be aware that D-mannose contains sugar.  Note for patients with interstitial cystitis (IC):  - Patients with IC should typically avoid cranberry/ PAC supplements and Vitamin C supplements due to their acidity, which may exacerbate IC-related bladder pain. - Symptoms of true bacterial UTI can overlap / mimic symptoms of an IC flare up. Antibiotic use is NOT indicated for IC flare ups. Urine culture needed prior to antibiotic treatment for IC patients. The goal is to minimize your risk for developing antibiotic-resistant bacteria.    Vaginal atrophy I Genitourinary syndrome of menopause (GSM):  What it is: Changes in the vaginal environment (including the vulva and urethra) including: Thinning of the epithelium (skin/ mucosa surface) Can contribute to urinary urgency and frequency Can contribute to dryness, itching, irritation of the vulvar and vaginal tissue Can contribute to pain with intercourse Can contribute to physical changes of the labia, vulva, and vagina such as: Narrowing of the vaginal opening Decreased vaginal length Loss of labial architecture Labial adhesions Pale color of vulvovaginal tissue Loss of pubic hair Allows bacteria to become adherent Results in increased risk for urinary tract infection (UTI) due to bacterial overgrowth and migration up the urethra into the bladder Change in  vaginal pH (acid/ base balance) Allows for alteration / disruption of the normal bacterial flora / microbiome Results in increased risk for urinary tract infection (UTI) due to bacterial overgrowth  Treatment options: Over-the-counter lubricants (see list below). Prescription vaginal estrogen replacement. Options: Topical vaginal estrogen (estradiol ) cream: (Estrace , Premarin, compounded) Apply as directed Estring  vaginal ring Exchanged every 3 months (either at home or in office by provider) Vagifem  vaginal tablet Inserted nightly for 2 weeks then twice a week (long term) lntrarosa vaginal suppository Vaginal DHEA: converts to estrogen in vaginal tissue without systemic effect Inserted nightly (long term) 3. Vaginal laser therapy (Mona Edwina Gram touch) Performed in 3 treatments each 6 weeks apart (available in our [place] office). Can feel like a sunburn for 3-4 days after each treatment until new skin heals in. Usually not covered by insurance. Estimated cost is $1500 for all 3 sessions.  FYI regarding prescription vaginal estrogen treatment options: - All topical vaginal estrogen replacement options are equivalent in terms of efficacy. - Topical vaginal estrogen replacement will take about 3 months to be effective. - OK to have sex with any of the topical vaginal estrogen replacement options. - Topical vaginal estrogen replacement may sting/burn initially due to severe dryness, which will improve with ongoing treatment. - Studies have demonstrated negligible systemic absorption of low-dose intravaginal estrogen cream therefore the risk for cancer development or recurrence with this medication is minimal.  Genitourinary Syndrome of Menopause: AUA/SUFU/AUGS Guideline (2025) [place].at  Topical vaginal estrogen cream safe to use with breast cancer  history [email or web address].[email or web address]  Topical vaginal estrogen cream safe to use with blood clot history [email or web address]   Lubricants and Moisturizers for Treating Genitourinary Syndrome of Menopause and Vulvovaginal Atrophy Treatment Comments I  Available Products   Lubricants   Water-based Ingredients: Deionized water, glycerin, propylene glycol; latex safe; rare irritation; dry out with extended sexual activity Astroglide, Good Clean Love, K-Y Jelly, Natural, Organic, Pink, Sliquid, Sylk, Yes    Oil Based Ingredients: avocado, olive, peanut, corn; latex safe; can be used with silicone products; staining; safe (unless peanut allergy); non-irritating Coconut oil, vegetable oil, vitamin E oil  Silicone-Based Ingredients: Silicone polymers; staining; typically nonirritating, long lasting; waterproof; should not be used with silicone dilators, sexual toys, or gynecologic products Astroglide X, Oceanus Ultra Pure, Pink Silicone, Pjur Eros, Replens Silky Smooth, Silicone Premium JO, SKYN, Uberlube, [employer] Based Minimize harm to sperm motility; designed for couples trying to conceive:  Astroglide TTC, Conceive Plus, PreSeed, Yes Baby  Fertility Friendly Minimize harm to sperm motility; designed for couples trying to conceive:  Astroglide, TTC, Conceive Plus, PreSeed, Yes Baby  Vaginal Moisturizers   Vaginal Moisturizers For maintenance use 1 to 3 times weekly; can benefit women with dryness, chafing with AOL, and recurrent vaginal infections irrespective of sexual activity timing Balance Active Menopause Vaginal Moisturizing Lubricant, Canesintima Intimate Moisturizer, Replens, Rephresh, Sylk Natural Intimate Moisturizer, Yes Vaginal  Moisturizer  Hybrids Properties of both water and silicone-based products (combination of a vaginal lubricant and moisturizer); Non-irritating; good option for women with allergies and sensitivities Lubrigyn, Luvena  Suppositories Hyaluronic acid to retain moisture Revaree  Vulvar Soothing Creams/Oils    Medicated Creams Pain and burn relief; Ingredients: 4% Lidocaine, Aloe Vera gel Releveum ([place])  Non-Medicated Creams For anti-itch and moisture/maintenance; Ingredients: Coconut oil, Avocado oil, Shea Butter, Olive oil, Vitamin E Vajuvenate, Vmagic  Oils for moisture/maintenance: Coconut oil, Vitamin E oil, Emu oil

## 2023-11-15 ENCOUNTER — Ambulatory Visit (HOSPITAL_COMMUNITY)

## 2023-11-23 ENCOUNTER — Ambulatory Visit (HOSPITAL_COMMUNITY)
Admission: RE | Admit: 2023-11-23 | Discharge: 2023-11-23 | Disposition: A | Discharge status: Home / Self Care | Source: Ambulatory Visit | Attending: Family Medicine | Admitting: Family Medicine

## 2023-11-23 ENCOUNTER — Encounter (HOSPITAL_COMMUNITY): Payer: Self-pay

## 2023-11-23 DIAGNOSIS — Z1231 Encounter for screening mammogram for malignant neoplasm of breast: Secondary | ICD-10-CM | POA: Diagnosis not present

## 2023-12-06 DIAGNOSIS — N189 Chronic kidney disease, unspecified: Secondary | ICD-10-CM | POA: Diagnosis not present

## 2023-12-06 DIAGNOSIS — E211 Secondary hyperparathyroidism, not elsewhere classified: Secondary | ICD-10-CM | POA: Diagnosis not present

## 2023-12-06 DIAGNOSIS — R809 Proteinuria, unspecified: Secondary | ICD-10-CM | POA: Diagnosis not present

## 2023-12-06 DIAGNOSIS — D649 Anemia, unspecified: Secondary | ICD-10-CM | POA: Diagnosis not present

## 2023-12-06 DIAGNOSIS — D631 Anemia in chronic kidney disease: Secondary | ICD-10-CM | POA: Diagnosis not present

## 2023-12-11 DIAGNOSIS — R809 Proteinuria, unspecified: Secondary | ICD-10-CM | POA: Diagnosis not present

## 2023-12-11 DIAGNOSIS — N2581 Secondary hyperparathyroidism of renal origin: Secondary | ICD-10-CM | POA: Diagnosis not present

## 2023-12-11 DIAGNOSIS — I129 Hypertensive chronic kidney disease with stage 1 through stage 4 chronic kidney disease, or unspecified chronic kidney disease: Secondary | ICD-10-CM | POA: Diagnosis not present

## 2023-12-11 DIAGNOSIS — N184 Chronic kidney disease, stage 4 (severe): Secondary | ICD-10-CM | POA: Diagnosis not present

## 2023-12-15 DIAGNOSIS — M1711 Unilateral primary osteoarthritis, right knee: Secondary | ICD-10-CM | POA: Insufficient documentation

## 2023-12-19 ENCOUNTER — Ambulatory Visit: Admitting: Urology

## 2023-12-19 DIAGNOSIS — R102 Pelvic and perineal pain: Secondary | ICD-10-CM

## 2023-12-19 DIAGNOSIS — N390 Urinary tract infection, site not specified: Secondary | ICD-10-CM

## 2023-12-19 DIAGNOSIS — N952 Postmenopausal atrophic vaginitis: Secondary | ICD-10-CM

## 2023-12-19 MED ORDER — ESTRADIOL 0.1 MG/GM VA CREA: TOPICAL_CREAM | VAGINAL | 3 refills | Status: AC

## 2023-12-19 NOTE — Progress Notes (Signed)
 History of Present Illness: [name] is a 89 y.o. year old female here for continued attention to recurrent urinary tract infections, vaginal atrophic changes (on Estrace  cream) as well as pelvic pain (with history of pelvic fracture in mid 2024).  She has been told that she had interstitial cystitis.  She has rare pain with urination.  Her throbbing pelvic pain is not related to bowel or bladder function, either making it worse or better.  She denies gross hematuria.  She has been given a prescription for estrogen cream but did not get this because of cost.    Past Medical History:  Diagnosis Date   Anemia    Anxiety    Arthritis    Bleeding from the nose Oct 2014   CAD (coronary artery disease)    a. s/p rotational atherectomy and DES placement to LAD in 2012 b. low-risk NST in 08/2018   Cataract of both eyes    Chronic kidney disease    Colon polyps    Hyperlipidemia    Hypertension    x 5-6 yrs.   LBBB (left bundle branch block)    Renal insufficiency     Past Surgical History:  Procedure Laterality Date   BREAST SURGERY     masses removed while she was pregnant   CARDIAC CATHETERIZATION  06/20/2008   severe single vessel high-grade stenosis of mid LAD at bifurcation of diagonal 1 and setpal perforator 1; diffuse coronary calcification of L coronary system; 50% stenosis of mid RCA (Dr. DOROTHA Schwalbe)   CATARACT EXTRACTION W/PHACO Right 10/10/2013   Procedure: CATARACT EXTRACTION PHACO AND INTRAOCULAR LENS PLACEMENT (IOC);  Surgeon: Cherene Mania, MD;  Location: [facility];  Service: Ophthalmology;  Laterality: Right;  CDE:11.96   CATARACT EXTRACTION W/PHACO Left 11/18/2013   Procedure: CATARACT EXTRACTION PHACO AND INTRAOCULAR LENS PLACEMENT (IOC);  Surgeon: Cherene Mania, MD;  Location: [facility];  Service: Ophthalmology;  Laterality: Left;  CDE 15.96   COLONOSCOPY N/A 01/16/2013   Procedure: COLONOSCOPY;  Surgeon: Claudis RAYMOND Rivet, MD; igmoid colon diverticulosis as well as changes  of diverticulitis involving one diverticulum. She also had external hemorrhoids.   CORONARY ANGIOPLASTY WITH STENT PLACEMENT  11/08/2010   complex high-speed rotational atherectomy of calcified LAD, diffusely disease LAD system (Dr. IVAR Sor)   EXCISION ORAL TUMOR Right 01/14/2014   Procedure: EXCISION OROPHARYNGEAL MASS;  Surgeon: Ana LELON Moccasin, MD;  Location: [facility];  Service: ENT;  Laterality: Right;   NM MYOCAR PERF WALL MOTION  05/10/2011   lexiscan  myoview ; normal pattern of perfusion in all regions; post-stress EF 55%; low risk scan    PARTIAL HYSTERECTOMY     TONSILLECTOMY     TRANSTHORACIC ECHOCARDIOGRAM  10/08/2010   EF 45-50%, mod conc LVH, grade 1 diastolic dysfunction, mildly calcified left AV cusp; calcified MV annulus    Home Medications:  (Not in a hospital admission)   Allergies:  Allergies  Allergen Reactions   Bactrim  [Sulfamethoxazole -Trimethoprim ] Hives   Macrobid [Nitrofurantoin] Nausea And Vomiting    Family History  Problem Relation Age of Onset   Liver cancer Mother    Cancer Father    Heart Problems Brother    Coronary artery disease Sister        stent   Heart Problems Daughter        also stomach problems   COPD Daughter    Colon cancer Neg Hx     Social History:  reports that she has never smoked. She  has never used smokeless tobacco. She reports that she does not drink alcohol and does not use drugs.  ROS: A complete review of systems was performed.  All systems are negative except for pertinent findings as noted.  Physical Exam:  Vital signs in last 24 hours: @VSRANGES @ General:  Alert and oriented, No acute distress HEENT: Normocephalic, atraumatic Neck: No JVD or lymphadenopathy Cardiovascular: Regular rate  Lungs: Normal inspiratory/expiratory excursions Extremities: No edema Neurologic: Grossly intact  I have reviewed prior pt notes--urology and GYN  I have reviewed urinalysis results--clear today.  I cannot see  where she is really had microscopic hematuria before  I have independently reviewed prior imaging--CT images reviewed.  Radiologist read that there was perivesical fat stranding.  I am not impressed with the appearance of the bladder  I have reviewed prior urine culture   Impression/Assessment:  -Vaginal atrophy.  Patient has prescription for estrogen cream but she is not used  - Pelvic pain.  I do not think that this is necessarily urologic in nature  Plan:  -I did recommend that she get her estrogen cream and use it 2 nights a week long-term  - I let her know that I do not think she has interstitial cystitis  - I will have her come back in 3 months to recheck her symptoms  [name] 12/19/2023, 12:14 PM  Cindy HERO. Lewanda Perea MD

## 2023-12-20 LAB — MICROSCOPIC EXAMINATION
Bacteria, UA: NONE SEEN
Epithelial Cells (non renal): >10 /HPF — AB (ref 0–10)

## 2023-12-20 LAB — URINALYSIS, ROUTINE W REFLEX MICROSCOPIC
Bilirubin, UA: NEGATIVE
Glucose, UA: NEGATIVE
Ketones, UA: NEGATIVE
Nitrite, UA: NEGATIVE
Protein,UA: NEGATIVE
Specific Gravity, UA: 1.025 (ref 1.005–1.030)
Urobilinogen, Ur: 0.2 mg/dL (ref 0.2–1.0)
pH, UA: 6 (ref 5.0–7.5)

## 2023-12-21 DIAGNOSIS — E538 Deficiency of other specified B group vitamins: Secondary | ICD-10-CM | POA: Diagnosis not present

## 2023-12-26 DIAGNOSIS — M1711 Unilateral primary osteoarthritis, right knee: Secondary | ICD-10-CM | POA: Diagnosis not present

## 2024-01-02 DIAGNOSIS — M1711 Unilateral primary osteoarthritis, right knee: Secondary | ICD-10-CM | POA: Diagnosis not present

## 2024-01-09 DIAGNOSIS — M1711 Unilateral primary osteoarthritis, right knee: Secondary | ICD-10-CM | POA: Diagnosis not present

## 2024-01-15 ENCOUNTER — Ambulatory Visit

## 2024-01-15 VITALS — BP 164/67 | HR 78 | Ht 62.0 in | Wt 139.1 lb

## 2024-01-15 DIAGNOSIS — I10 Essential (primary) hypertension: Secondary | ICD-10-CM

## 2024-01-15 DIAGNOSIS — E538 Deficiency of other specified B group vitamins: Secondary | ICD-10-CM

## 2024-01-15 MED ORDER — CYANOCOBALAMIN 1000 MCG/ML IJ SOLN
1000.0000 ug | INTRAMUSCULAR | Status: AC
  Administered 2024-02-20 – 2024-05-29 (×2): 1000 ug via INTRAMUSCULAR

## 2024-01-15 NOTE — Progress Notes (Unsigned)
 New Patient Office Visit  Subjective    Patient ID: [name], female    DOB: 1933-09-17  Age: 89 y.o. MRN: [id number]  CC:  Chief Complaint  Patient presents with   Establish Care    HPI [name] presents to establish care Discussed the use of AI scribe software for clinical note transcription with the patient, who gave verbal consent to proceed.  History of Present Illness   [name] is an 89 year old female with stage three kidney disease who presents with throbbing and burning pain in the lower abdomen.  Lower abdominal pain and dysuria - Throbbing and burning pain across the lower abdomen - Pain is intermittent and worsens with pressure - Burning sensation occurs when drinking coffee or when bladder fills at night - Cephalexin  250 mg daily has provided symptom relief - Concern regarding the impact of Cephalexin  on stage three chronic kidney disease - History of being told she had interstitial cystitis, but a urologist later ruled this out after a urine test - Cystoscopy showed no abnormalities  Knee pain - Severe burning pain in the knee - Received two rounds of gel injections with some relief - Previously received a cortisone injection  Barriers to medical care - Husband has cancer, is nearly blind, and has COPD, limiting her ability to travel long distances for medical care - Relocated to the area to be closer to her daughter in [place]       Outpatient Encounter Medications as of 01/15/2024  Medication Sig   acetaminophen  (TYLENOL ) 325 MG tablet Take 2 tablets (650 mg total) by mouth in the morning, at noon, and at bedtime.   ALPRAZolam  (XANAX ) 0.5 MG tablet Take 0.5 mg by mouth See admin instructions. Take 0.5 mg (1 tablet) nightly at bedtime. Takes an additional 0.5 mg during the day if needed for anxiety.   amLODipine  (NORVASC ) 10 MG tablet Take 0.5 tablets (5 mg total) by mouth daily in the afternoon.   aspirin  EC 81 MG tablet Take 1 tablet  (81 mg total) by mouth daily. Swallow whole.   cephALEXin  (KEFLEX ) 250 MG capsule Take 1 capsule (250 mg total) by mouth daily.   diazepam  (VALIUM ) 5 MG tablet 1 tablet per vagina at bedtime   estradiol  (ESTRACE ) 0.1 MG/GM vaginal cream Discard plastic applicator. Insert a blueberry size amount to vaginal opening 2 nights/week   furosemide (LASIX) 40 MG tablet Take 40 mg by mouth daily.   isosorbide  mononitrate (IMDUR ) 60 MG 24 hr tablet Take 1 tablet (60 mg total) by mouth 2 (two) times daily.   Multiple Vitamins-Minerals (ONE A DAY WOMEN 50 PLUS) TABS Take 1 tablet by mouth daily.   nitroGLYCERIN  (NITROSTAT ) 0.4 MG SL tablet Place 1 tablet (0.4 mg total) under the tongue every 5 (five) minutes as needed for chest pain.   rosuvastatin  (CRESTOR ) 5 MG tablet Take 1 tablet (5 mg total) by mouth at bedtime.   sodium bicarbonate  650 MG tablet Take 325 mg by mouth 3 (three) times daily.   Urelle  (URELLE /URISED) 81 MG TABS tablet Take 1 tablet (81 mg total) by mouth 3 (three) times daily.   valsartan (DIOVAN) 40 MG tablet Take 40 mg by mouth daily.   [DISCONTINUED] clobetasol  ointment (TEMOVATE ) 0.05 % Apply 1 Application topically 2 (two) times daily. (Patient not taking: Reported on 01/15/2024)   [DISCONTINUED] metoprolol  succinate (TOPROL  XL) 25 MG 24 hr tablet Take 1 tablet (25 mg total) by mouth daily. (Patient not taking: Reported  on 01/15/2024)   Facility-Administered Encounter Medications as of 01/15/2024  Medication   [START ON 01/22/2024] cyanocobalamin  (VITAMIN B12) injection 1,000 mcg    Past Medical History:  Diagnosis Date   Anemia    Anxiety    Arthritis    Bleeding from the nose Oct 2014   CAD (coronary artery disease)    a. s/p rotational atherectomy and DES placement to LAD in 2012 b. low-risk NST in 08/2018   Cataract of both eyes    Chronic kidney disease    Colon polyps    Hyperlipidemia    Hypertension    x 5-6 yrs.   LBBB (left bundle branch block)    Renal insufficiency      Past Surgical History:  Procedure Laterality Date   BREAST SURGERY     masses removed while she was pregnant   CARDIAC CATHETERIZATION  06/20/2008   severe single vessel high-grade stenosis of mid LAD at bifurcation of diagonal 1 and setpal perforator 1; diffuse coronary calcification of L coronary system; 50% stenosis of mid RCA (Dr. DOROTHA Schwalbe)   CATARACT EXTRACTION W/PHACO Right 10/10/2013   Procedure: CATARACT EXTRACTION PHACO AND INTRAOCULAR LENS PLACEMENT (IOC);  Surgeon: Cherene Mania, MD;  Location: [facility];  Service: Ophthalmology;  Laterality: Right;  CDE:11.96   CATARACT EXTRACTION W/PHACO Left 11/18/2013   Procedure: CATARACT EXTRACTION PHACO AND INTRAOCULAR LENS PLACEMENT (IOC);  Surgeon: Cherene Mania, MD;  Location: [facility];  Service: Ophthalmology;  Laterality: Left;  CDE 15.96   COLONOSCOPY N/A 01/16/2013   Procedure: COLONOSCOPY;  Surgeon: Claudis RAYMOND Rivet, MD; igmoid colon diverticulosis as well as changes of diverticulitis involving one diverticulum. She also had external hemorrhoids.   CORONARY ANGIOPLASTY WITH STENT PLACEMENT  11/08/2010   complex high-speed rotational atherectomy of calcified LAD, diffusely disease LAD system (Dr. IVAR Sor)   EXCISION ORAL TUMOR Right 01/14/2014   Procedure: EXCISION OROPHARYNGEAL MASS;  Surgeon: Ana LELON Moccasin, MD;  Location: [facility];  Service: ENT;  Laterality: Right;   NM MYOCAR PERF WALL MOTION  05/10/2011   lexiscan  myoview ; normal pattern of perfusion in all regions; post-stress EF 55%; low risk scan    PARTIAL HYSTERECTOMY     TONSILLECTOMY     TRANSTHORACIC ECHOCARDIOGRAM  10/08/2010   EF 45-50%, mod conc LVH, grade 1 diastolic dysfunction, mildly calcified left AV cusp; calcified MV annulus    Family History  Problem Relation Age of Onset   Liver cancer Mother    Cancer Father    Heart Problems Brother    Coronary artery disease Sister        stent   Heart Problems Daughter        also stomach problems   COPD  Daughter    Colon cancer Neg Hx     Social History   Socioeconomic History   Marital status: Married    Spouse name: Not on file   Number of children: 4   Years of education: 8   Highest education level: Not on file  Occupational History   Not on file  Tobacco Use   Smoking status: Never   Smokeless tobacco: Never  Vaping Use   Vaping status: Never Used  Substance and Sexual Activity   Alcohol use: No   Drug use: No   Sexual activity: Yes    Birth control/protection: Surgical  Other Topics Concern   Not on file  Social History Narrative   Not on file   Social Drivers of Health  Financial Resource Strain: Not on file  Food Insecurity: No Food Insecurity (03/05/2023)   Hunger Vital Sign    Worried About Running Out of Food in the Last Year: Never true    Ran Out of Food in the Last Year: Never true  Transportation Needs: Not on file  Physical Activity: Not on file  Stress: Not on file  Social Connections: Not on file  Intimate Partner Violence: Not on file    ROS      Objective    BP (!) 164/67   Pulse 78   Ht 5' 2 (1.575 m)   Wt 139 lb 1.9 oz (63.1 kg)   SpO2 93%   BMI 25.45 kg/m   Physical Exam Vitals and nursing note reviewed.  Constitutional:      Appearance: Normal appearance.  HENT:     Head: Normocephalic.  Eyes:     Extraocular Movements: Extraocular movements intact.     Pupils: Pupils are equal, round, and reactive to light.  Cardiovascular:     Rate and Rhythm: Normal rate and regular rhythm.  Pulmonary:     Effort: Pulmonary effort is normal.     Breath sounds: Normal breath sounds.  Musculoskeletal:     Cervical back: Normal range of motion and neck supple.  Neurological:     Mental Status: She is alert and oriented to person, place, and time.  Psychiatric:        Mood and Affect: Mood normal.        Thought Content: Thought content normal.         Assessment & Plan:   Problem List Items Addressed This Visit        Cardiovascular and Mediastinum   stage 2 hypertension   Elevated today, but she reports not having her medications yet.  She is under a lot of stress d/t her husband's declining health.  She also has chronic knee pain, which could attribute to elevated blood pressure.   No medication changes made today.          Other   B12 deficiency - Primary   Receives monthly B12 injections, plans to continue. - Order B12 injection for administration next week. - Schedule nurse visit for B12 injection every 30 days.      Relevant Medications   cyanocobalamin  (VITAMIN B12) injection 1,000 mcg (Start on 01/22/2024 12:00 AM)        Return in about 6 months (around 07/14/2024) for chronic follow-up with PCP.   Leita Longs, FNP

## 2024-01-17 DIAGNOSIS — E538 Deficiency of other specified B group vitamins: Secondary | ICD-10-CM | POA: Insufficient documentation

## 2024-01-17 NOTE — Assessment & Plan Note (Signed)
 Receives monthly B12 injections, plans to continue. - Order B12 injection for administration next week. - Schedule nurse visit for B12 injection every 30 days.

## 2024-01-17 NOTE — Assessment & Plan Note (Signed)
 Elevated today, but she reports not having her medications yet.  She is under a lot of stress d/t her husband's declining health.  She also has chronic knee pain, which could attribute to elevated blood pressure.   No medication changes made today.

## 2024-01-22 ENCOUNTER — Ambulatory Visit (INDEPENDENT_AMBULATORY_CARE_PROVIDER_SITE_OTHER)

## 2024-01-22 DIAGNOSIS — E538 Deficiency of other specified B group vitamins: Secondary | ICD-10-CM | POA: Diagnosis not present

## 2024-01-22 MED ORDER — CYANOCOBALAMIN 1000 MCG/ML IJ SOLN
1000.0000 ug | Freq: Once | INTRAMUSCULAR | Status: AC
  Administered 2024-01-22: 1000 ug via INTRAMUSCULAR

## 2024-01-22 NOTE — Progress Notes (Signed)
 Patient is in office today for a nurse visit for B12 Injection. Patient Injection was given in the  Right deltoid. Patient tolerated injection well.

## 2024-01-29 ENCOUNTER — Ambulatory Visit: Payer: Self-pay

## 2024-01-29 NOTE — Telephone Encounter (Signed)
 FYI Only or Action Required?: Action required by provider: request for appointment.  Patient was last seen in primary care on 01/15/2024 by Bevely Doffing, FNP.  Called Nurse Triage reporting Vaginal Pain.  Symptoms began several months ago.  Interventions attempted: OTC medications: vaginal ointment (patient could not recall name) and Prescription medications: cephalexin .  Symptoms are: unchanged.  Triage Disposition: See PCP Within 2 Weeks  Patient/caregiver understands and will follow disposition?: Yes                             Copied from CRM [id number]. Topic: Clinical - Red Word Triage >> Jan 29, 2024  3:59 PM Larissa S wrote: [facility] that prompted transfer to Nurse Triage: vaginal pain Reason for Disposition  Pain in genital area is a chronic symptom (recurrent or ongoing AND present > 4 weeks)  Answer Assessment - Initial Assessment Questions 1. SYMPTOM: What's the main symptom you're concerned about? (e.g., pain, itching, dryness)     Vaginal pain 2. LOCATION: Where is the pain located? (e.g., inside/outside, left/right)     Inside of lips of vagina, denies worsening symptoms 3. ONSET: When did the vaginal symptoms start?     Several months 4. PAIN: Is there any pain? If Yes, ask: How bad is it? (Scale: 1-10; mild, moderate, severe)     States pain is mostly mild, but becomes severe sporadically  5. ITCHING: Is there any itching? If Yes, ask: How bad is it? (Scale: 1-10; mild, moderate, severe)     Denies 6. CAUSE: What do you think is causing the discharge? Have you had the same problem before? What happened then?     Unsure 7. OTHER SYMPTOMS: Do you have any other symptoms? (e.g., fever, itching, vaginal bleeding, pain with urination, injury to genital area, vaginal foreign body)     Denies painful urination, denies urinary frequency, denies abdominal pain, denies fever, denies bleeding    Patient stated she has  already completed an antibiotic and vaginal ointment for vaginal symptoms. Patient stated symptoms have not improved. Patient stated she is supposed to receive an injection for pain in her knee, but does not want to get it until vaginal symptoms have resolved. Advised patient to be seen within 2 weeks for chronic symptoms. No availability with PCP until January. Please advise on scheduling. Patient would liked a call back.  Protocols used: Vaginal Symptoms-A-AH

## 2024-01-30 NOTE — Telephone Encounter (Signed)
 Called patient she has been putting it off for a while due to husband being sick she will wait. Added to wait list

## 2024-02-20 ENCOUNTER — Ambulatory Visit (INDEPENDENT_AMBULATORY_CARE_PROVIDER_SITE_OTHER)

## 2024-02-20 DIAGNOSIS — E538 Deficiency of other specified B group vitamins: Secondary | ICD-10-CM | POA: Diagnosis not present

## 2024-02-20 NOTE — Progress Notes (Signed)
 Patient is in office today for a nurse visit for B12 Injection. Patient Injection was given in the  Right deltoid. Patient tolerated injection well.

## 2024-03-19 ENCOUNTER — Ambulatory Visit: Admitting: Urology

## 2024-03-21 ENCOUNTER — Ambulatory Visit (INDEPENDENT_AMBULATORY_CARE_PROVIDER_SITE_OTHER)

## 2024-03-21 DIAGNOSIS — E538 Deficiency of other specified B group vitamins: Secondary | ICD-10-CM | POA: Diagnosis not present

## 2024-03-21 MED ORDER — CYANOCOBALAMIN 1000 MCG/ML IJ SOLN
1000.0000 ug | Freq: Once | INTRAMUSCULAR | Status: AC
  Administered 2024-03-21: 1000 ug via INTRAMUSCULAR

## 2024-03-25 ENCOUNTER — Other Ambulatory Visit: Payer: Self-pay

## 2024-03-25 DIAGNOSIS — N3021 Other chronic cystitis with hematuria: Secondary | ICD-10-CM

## 2024-03-25 MED ORDER — ALPRAZOLAM 0.5 MG PO TABS: 0.5000 mg | ORAL_TABLET | Freq: Three times a day (TID) | ORAL | 0 refills | Status: AC | PRN

## 2024-03-25 NOTE — Telephone Encounter (Signed)
 Copied from CRM #[id number]. Topic: Clinical - Medication Refill >> Mar 25, 2024  8:53 AM Leonette SQUIBB wrote: Medication: Alprazolam  0.5  Has the patient contacted their pharmacy? No  This is the patient's preferred pharmacy:  [facility] [address] [place] - [address] [place] [address] Phone: [phone] Fax: [phone]  Is this the correct pharmacy for this prescription? Yes If no, delete pharmacy and type the correct one.   Has the prescription been filled recently? Yes  Is the patient out of the medication? No  Has the patient been seen for an appointment in the last year OR does the patient have an upcoming appointment? Yes  Can we respond through MyChart? No  Agent: Please be advised that Rx refills may take up to 3 business days. We ask that you follow-up with your pharmacy.

## 2024-04-09 ENCOUNTER — Telehealth: Payer: Self-pay

## 2024-04-09 ENCOUNTER — Ambulatory Visit: Payer: Self-pay

## 2024-04-09 NOTE — Telephone Encounter (Signed)
 FYI Only or Action Required?: FYI only for provider: appointment scheduled on tomorrow.  Patient was last seen in primary care on 01/15/2024 by Bevely Doffing, FNP.  Called Nurse Triage reporting Vaginal Pain.  Symptoms began several days ago.  Interventions attempted: Rest, hydration, or home remedies.  Symptoms are: unchanged.  Triage Disposition: See Physician Within 24 Hours  Patient/caregiver understands and will follow disposition?: Yes, will follow disposition  Copied from CRM #[id number]. Topic: Clinical - Red Word Triage >> Apr 09, 2024 10:30 AM Antwanette L wrote: Red Word that prompted transfer to Nurse Triage: The patient reports breaking out in the genital area. She is experiencing burning and intermittent throbbing pain. The patient also notes the presence of two knots in the genital region. Reason for Disposition  MODERATE-SEVERE itching (i.e., interferes with school, work, or sleep)  Answer Assessment - Initial Assessment Questions 1. SYMPTOM: What's the main symptom you're concerned about? (e.g., pain, itching, dryness)     Burning, intermittent throbbing pain 2. LOCATION: Where is the  burning/pain located? (e.g., inside/outside, left/right)     On the outside, including by the rectum 3. ONSET: When did the  burning/pain  start?     Ongoing, states getting worse, was treated for a staph infection recently 4. PAIN: Is there any pain? If Yes, ask: How bad is it? (Scale: 1-10; mild, moderate, severe)     Throbs for a few minutes at a time,  5. ITCHING: Is there any itching? If Yes, ask: How bad is it? (Scale: 1-10; mild, moderate, severe)     A little 6. CAUSE: What do you think is causing the discharge? Have you had the same problem before? What happened then?     unsure 7. OTHER SYMPTOMS: Do you have any other symptoms? (e.g., fever, itching, vaginal bleeding, pain with urination, injury to genital area, vaginal foreign body)     Denies  discharge  Pt states that it is intermittent and occurs a few times throughout the day, resolves on its own. Pt states that she is not sexually active. Pt denies any urinary s/s.  Protocols used: Vaginal Symptoms-A-AH

## 2024-04-09 NOTE — Telephone Encounter (Signed)
 Patient called in today about a break out in her private area, pt state's she has two bumps in the private area  that is causing itching. Pt was advised to contact PCP for a earlier appointment due to no available appointments. Pt state's PCP told her to contact our office to get an appointment in the next few days.. Pt is made aware a message will be sent to Dr. Matilda on advisement.

## 2024-04-09 NOTE — Telephone Encounter (Signed)
Not

## 2024-04-10 ENCOUNTER — Encounter: Payer: Self-pay | Admitting: Internal Medicine

## 2024-04-10 ENCOUNTER — Ambulatory Visit: Admitting: Internal Medicine

## 2024-04-10 VITALS — BP 136/64 | HR 76 | Ht 62.0 in | Wt 134.2 lb

## 2024-04-10 DIAGNOSIS — N3020 Other chronic cystitis without hematuria: Secondary | ICD-10-CM | POA: Diagnosis not present

## 2024-04-10 DIAGNOSIS — N952 Postmenopausal atrophic vaginitis: Secondary | ICD-10-CM | POA: Diagnosis not present

## 2024-04-10 DIAGNOSIS — L739 Follicular disorder, unspecified: Secondary | ICD-10-CM | POA: Insufficient documentation

## 2024-04-10 MED ORDER — MUPIROCIN 2 % EX OINT: 1.0000 | TOPICAL_OINTMENT | Freq: Two times a day (BID) | CUTANEOUS | 0 refills | Status: AC

## 2024-04-10 MED ORDER — AMOXICILLIN-POT CLAVULANATE 500-125 MG PO TABS: 1.0000 | ORAL_TABLET | Freq: Two times a day (BID) | ORAL | 0 refills | Status: DC

## 2024-04-10 MED ORDER — AMOXICILLIN-POT CLAVULANATE 500-125 MG PO TABS: 1.0000 | ORAL_TABLET | Freq: Three times a day (TID) | ORAL | 0 refills | Status: DC

## 2024-04-10 NOTE — Assessment & Plan Note (Signed)
 Genital and perirectal area lesions are likely folliculitis/boils Started empiric Augmentin - renally dosed Mupirocin ointment prescribed for local relief Advised to keep area clean and dry

## 2024-04-10 NOTE — Patient Instructions (Addendum)
 Please start taking Augmentin as prescribed.  Please apply Mupirocin ointment over boil area.

## 2024-04-10 NOTE — Telephone Encounter (Signed)
 Sorry, but I do not have time to see her today. She might want to go to an urgent care center. Pt is made aware and voiced understanding

## 2024-04-10 NOTE — Assessment & Plan Note (Signed)
 Chronic pelvic pain, likely due to chronic cystitis CT abdomen in 05/25 showed signs of cystitis Has been evaluated by urology Started empiric Augmentin for now Check urine culture

## 2024-04-10 NOTE — Progress Notes (Signed)
 Acute Office Visit  Subjective:    Patient ID: [name], female    DOB: 09/08/1933, 89 y.o.   MRN: [id number]  Chief Complaint  Patient presents with   Bumps in genital area   Bladder pain    Chronic    HPI Patient is in today for complaint of perineal area redness and small boil like lesions for the last 1 week.  She also reports chronic bladder pain, which is worse for the last 2 weeks.  Of note, she has been evaluated for chronic pelvic pain by urology and has been told of chronic/interstitial cystitis.  She was given Keflex  to 50 mg as prophylaxis, but she has stopped taking it for the last 2 months.  Denies any fever or chills currently.  Denies hematuria, vaginal bleeding or discharge currently.  Past Medical History:  Diagnosis Date   Anemia    Anxiety    Arthritis    Bleeding from the nose Oct 2014   CAD (coronary artery disease)    a. s/p rotational atherectomy and DES placement to LAD in 2012 b. low-risk NST in 08/2018   Cataract of both eyes    Chronic kidney disease    Colon polyps    Hyperlipidemia    Hypertension    x 5-6 yrs.   LBBB (left bundle branch block)    Renal insufficiency     Past Surgical History:  Procedure Laterality Date   BREAST SURGERY     masses removed while she was pregnant   CARDIAC CATHETERIZATION  06/20/2008   severe single vessel high-grade stenosis of mid LAD at bifurcation of diagonal 1 and setpal perforator 1; diffuse coronary calcification of L coronary system; 50% stenosis of mid RCA (Dr. DOROTHA Schwalbe)   CATARACT EXTRACTION W/PHACO Right 10/10/2013   Procedure: CATARACT EXTRACTION PHACO AND INTRAOCULAR LENS PLACEMENT (IOC);  Surgeon: Cherene Mania, MD;  Location: [facility];  Service: Ophthalmology;  Laterality: Right;  CDE:11.96   CATARACT EXTRACTION W/PHACO Left 11/18/2013   Procedure: CATARACT EXTRACTION PHACO AND INTRAOCULAR LENS PLACEMENT (IOC);  Surgeon: Cherene Mania, MD;  Location: [facility];  Service: Ophthalmology;  Laterality:  Left;  CDE 15.96   COLONOSCOPY N/A 01/16/2013   Procedure: COLONOSCOPY;  Surgeon: Claudis RAYMOND Rivet, MD; igmoid colon diverticulosis as well as changes of diverticulitis involving one diverticulum. She also had external hemorrhoids.   CORONARY ANGIOPLASTY WITH STENT PLACEMENT  11/08/2010   complex high-speed rotational atherectomy of calcified LAD, diffusely disease LAD system (Dr. IVAR Sor)   EXCISION ORAL TUMOR Right 01/14/2014   Procedure: EXCISION OROPHARYNGEAL MASS;  Surgeon: Ana LELON Moccasin, MD;  Location: [facility];  Service: ENT;  Laterality: Right;   NM MYOCAR PERF WALL MOTION  05/10/2011   lexiscan  myoview ; normal pattern of perfusion in all regions; post-stress EF 55%; low risk scan    PARTIAL HYSTERECTOMY     TONSILLECTOMY     TRANSTHORACIC ECHOCARDIOGRAM  10/08/2010   EF 45-50%, mod conc LVH, grade 1 diastolic dysfunction, mildly calcified left AV cusp; calcified MV annulus    Family History  Problem Relation Age of Onset   Liver cancer Mother    Cancer Father    Heart Problems Brother    Coronary artery disease Sister        stent   Heart Problems Daughter        also stomach problems   COPD Daughter    Colon cancer Neg Hx     Social History  Socioeconomic History   Marital status: Married    Spouse name: Not on file   Number of children: 4   Years of education: 8   Highest education level: Not on file  Occupational History   Not on file  Tobacco Use   Smoking status: Never   Smokeless tobacco: Never  Vaping Use   Vaping status: Never Used  Substance and Sexual Activity   Alcohol use: No   Drug use: No   Sexual activity: Yes    Birth control/protection: Surgical  Other Topics Concern   Not on file  Social History Narrative   Not on file   Social Drivers of Health   Financial Resource Strain: Not on file  Food Insecurity: No Food Insecurity (03/05/2023)   Hunger Vital Sign    Worried About Running Out of Food in the Last Year: Never  true    Ran Out of Food in the Last Year: Never true  Transportation Needs: Not on file  Physical Activity: Not on file  Stress: Not on file  Social Connections: Not on file  Intimate Partner Violence: Not on file    Outpatient Medications Prior to Visit  Medication Sig Dispense Refill   acetaminophen  (TYLENOL ) 325 MG tablet Take 2 tablets (650 mg total) by mouth in the morning, at noon, and at bedtime.     ALPRAZolam  (XANAX ) 0.5 MG tablet Take 1 tablet (0.5 mg total) by mouth 3 (three) times daily as needed for anxiety or sleep. 90 tablet 0   amLODipine  (NORVASC ) 10 MG tablet Take 0.5 tablets (5 mg total) by mouth daily in the afternoon.     aspirin  EC 81 MG tablet Take 1 tablet (81 mg total) by mouth daily. Swallow whole. 90 tablet 3   cephALEXin  (KEFLEX ) 250 MG capsule Take 1 capsule (250 mg total) by mouth daily. 30 capsule 5   diazepam  (VALIUM ) 5 MG tablet 1 tablet per vagina at bedtime 30 tablet 5   estradiol  (ESTRACE ) 0.1 MG/GM vaginal cream Discard plastic applicator. Insert a blueberry size amount to vaginal opening 2 nights/week 42 g 3   furosemide (LASIX) 40 MG tablet Take 40 mg by mouth daily.     isosorbide  mononitrate (IMDUR ) 60 MG 24 hr tablet Take 1 tablet (60 mg total) by mouth 2 (two) times daily. 180 tablet 3   Multiple Vitamins-Minerals (ONE A DAY WOMEN 50 PLUS) TABS Take 1 tablet by mouth daily.     nitroGLYCERIN  (NITROSTAT ) 0.4 MG SL tablet Place 1 tablet (0.4 mg total) under the tongue every 5 (five) minutes as needed for chest pain. 25 tablet 3   rosuvastatin  (CRESTOR ) 5 MG tablet Take 1 tablet (5 mg total) by mouth at bedtime.     sodium bicarbonate  650 MG tablet Take 325 mg by mouth 3 (three) times daily.     Urelle  (URELLE /URISED) 81 MG TABS tablet Take 1 tablet (81 mg total) by mouth 3 (three) times daily. 90 tablet 1   valsartan (DIOVAN) 40 MG tablet Take 40 mg by mouth daily.     Facility-Administered Medications Prior to Visit  Medication Dose Route  Frequency Provider Last Rate Last Admin   cyanocobalamin  (VITAMIN B12) injection 1,000 mcg  1,000 mcg Intramuscular Q30 days    1,000 mcg at 02/20/24 1558    Allergies  Allergen Reactions   Bactrim  [Sulfamethoxazole -Trimethoprim ] Hives   Macrobid [Nitrofurantoin] Nausea And Vomiting    Review of Systems  Constitutional:  Negative for chills and fever.  HENT:  Negative for congestion, sinus pressure, sinus pain and sore throat.   Eyes:  Negative for pain and discharge.  Respiratory:  Negative for cough and shortness of breath.   Cardiovascular:  Negative for chest pain and palpitations.  Gastrointestinal:  Positive for abdominal pain (Lower abdominal). Negative for diarrhea, nausea and vomiting.  Endocrine: Negative for polydipsia and polyuria.  Genitourinary:  Negative for dysuria and hematuria.  Musculoskeletal:  Negative for neck pain and neck stiffness.  Skin:  Negative for rash.  Neurological:  Negative for dizziness and weakness.  Psychiatric/Behavioral:  Negative for agitation and behavioral problems.        Objective:    Physical Exam Vitals reviewed.  Constitutional:      General: She is not in acute distress.    Appearance: She is not diaphoretic.  HENT:     Head: Normocephalic and atraumatic.     Nose: Nose normal.     Mouth/Throat:     Mouth: Mucous membranes are moist.  Eyes:     General: No scleral icterus.    Extraocular Movements: Extraocular movements intact.  Cardiovascular:     Rate and Rhythm: Normal rate and regular rhythm.     Heart sounds: Normal heart sounds. No murmur heard. Pulmonary:     Breath sounds: Normal breath sounds. No wheezing or rales.  Abdominal:     Palpations: Abdomen is soft.     Tenderness: There is abdominal tenderness (Suprapubic).  Genitourinary:    Comments: 2 small papules with surrounding erythema over perineal area 1 papule with surrounding erythema near anus Musculoskeletal:     Cervical back: Neck supple. No  tenderness.     Right lower leg: No edema.     Left lower leg: No edema.  Skin:    General: Skin is warm.     Findings: No rash.  Neurological:     General: No focal deficit present.     Mental Status: She is alert and oriented to person, place, and time.  Psychiatric:        Mood and Affect: Mood normal.        Behavior: Behavior normal.     BP 136/64 (BP Location: Left Arm)   Pulse 76   Ht 5' 2 (1.575 m)   Wt 134 lb 3.2 oz (60.9 kg)   SpO2 96%   BMI 24.55 kg/m  Wt Readings from Last 3 Encounters:  04/10/24 134 lb 3.2 oz (60.9 kg)  01/15/24 139 lb 1.9 oz (63.1 kg)  09/26/23 141 lb 12.8 oz (64.3 kg)        Assessment & Plan:   Problem List Items Addressed This Visit       Musculoskeletal and Integument   Folliculitis   Genital and perirectal area lesions are likely folliculitis/boils Started empiric Augmentin - renally dosed Mupirocin ointment prescribed for local relief Advised to keep area clean and dry      Relevant Medications   mupirocin ointment (BACTROBAN) 2 %     Genitourinary   Chronic cystitis - Primary   Chronic pelvic pain, likely due to chronic cystitis CT abdomen in 05/25 showed signs of cystitis Has been evaluated by urology Started empiric Augmentin for now Check urine culture      Relevant Medications   amoxicillin-clavulanate (AUGMENTIN) 500-125 MG tablet   Other Relevant Orders   Urine Culture   Atrophic vaginitis   Has been prescribed Estrace  vaginal cream, but was not able to get it due to cost concern Provided coupon  information, but she still has cost concern      Relevant Orders   NuSwab Vaginitis Plus (VG+)     Meds ordered this encounter  Medications   DISCONTD: amoxicillin-clavulanate (AUGMENTIN) 500-125 MG tablet    Sig: Take 1 tablet by mouth 3 (three) times daily.    Dispense:  10 tablet    Refill:  0   mupirocin ointment (BACTROBAN) 2 %    Sig: Apply 1 Application topically 2 (two) times daily.    Dispense:   22 g    Refill:  0   amoxicillin-clavulanate (AUGMENTIN) 500-125 MG tablet    Sig: Take 1 tablet by mouth 2 (two) times daily.    Dispense:  10 tablet    Refill:  0    Please cancel the prescription with TID dosing     Cindy MARLA Blanch, MD

## 2024-04-10 NOTE — Assessment & Plan Note (Signed)
 Has been prescribed Estrace  vaginal cream, but was not able to get it due to cost concern Provided coupon information, but she still has cost concern

## 2024-04-13 ENCOUNTER — Ambulatory Visit: Payer: Self-pay | Admitting: Internal Medicine

## 2024-04-13 LAB — NUSWAB VAGINITIS PLUS (VG+)
Candida albicans, NAA: NEGATIVE
Candida glabrata, NAA: NEGATIVE

## 2024-04-13 LAB — URINE CULTURE

## 2024-04-19 ENCOUNTER — Ambulatory Visit

## 2024-04-19 DIAGNOSIS — E538 Deficiency of other specified B group vitamins: Secondary | ICD-10-CM

## 2024-04-19 MED ORDER — CYANOCOBALAMIN 1000 MCG/ML IJ SOLN
1000.0000 ug | Freq: Once | INTRAMUSCULAR | Status: AC
  Administered 2024-04-19: 1000 ug via INTRAMUSCULAR

## 2024-04-19 NOTE — Progress Notes (Signed)
 Patient is in office today for a nurse visit for B12 Injection. Patient Injection was given in the  Left arm. Patient tolerated injection well.

## 2024-04-26 ENCOUNTER — Other Ambulatory Visit: Payer: Self-pay | Admitting: Student

## 2024-04-30 ENCOUNTER — Other Ambulatory Visit: Payer: Self-pay | Admitting: Student

## 2024-05-01 ENCOUNTER — Telehealth: Payer: Self-pay | Admitting: Cardiology

## 2024-05-01 MED ORDER — METOPROLOL SUCCINATE ER 25 MG PO TB24: 25.0000 mg | ORAL_TABLET | Freq: Every day | ORAL | 1 refills | Status: AC

## 2024-05-01 NOTE — Telephone Encounter (Signed)
 Pt c/o medication issue:  1. Name of Medication: metoprolol  succinate   2. How are you currently taking this medication (dosage and times per day)? Unknown   3. Are you having a reaction (difficulty breathing--STAT)? No   4. What is your medication issue? Verify dose and pt wanted to know if it discontinued because it helps

## 2024-05-01 NOTE — Telephone Encounter (Signed)
 Per phone note of 05/17/23 B.Strader ,PAC recommended staying on toprol  xl 25 mg every day Refill sent to [facility]

## 2024-05-15 ENCOUNTER — Other Ambulatory Visit: Payer: Self-pay

## 2024-05-15 MED ORDER — VALSARTAN 40 MG PO TABS: 40.0000 mg | ORAL_TABLET | Freq: Every day | ORAL | 1 refills | Status: AC

## 2024-05-29 ENCOUNTER — Ambulatory Visit: Payer: Self-pay

## 2024-05-29 DIAGNOSIS — E538 Deficiency of other specified B group vitamins: Secondary | ICD-10-CM | POA: Diagnosis not present

## 2024-05-29 NOTE — Progress Notes (Signed)
 Patient is in office today for a nurse visit for B12 Injection. Patient Injection was given in the  Right deltoid. Patient tolerated injection well.

## 2024-05-30 NOTE — Progress Notes (Signed)
 Patient is in office today for a nurse visit for B12 Injection. Patient Injection was given in the  Right deltoid. Patient tolerated injection well.

## 2024-05-31 ENCOUNTER — Telehealth: Payer: Self-pay

## 2024-05-31 ENCOUNTER — Ambulatory Visit (INDEPENDENT_AMBULATORY_CARE_PROVIDER_SITE_OTHER)

## 2024-05-31 VITALS — BP 150/60 | HR 74 | Resp 12 | Ht 62.0 in | Wt 131.0 lb

## 2024-05-31 DIAGNOSIS — Z0000 Encounter for general adult medical examination without abnormal findings: Secondary | ICD-10-CM | POA: Diagnosis not present

## 2024-05-31 DIAGNOSIS — Z2821 Immunization not carried out because of patient refusal: Secondary | ICD-10-CM | POA: Diagnosis not present

## 2024-05-31 NOTE — Progress Notes (Signed)
 "  Chief Complaint  Patient presents with   Medicare Wellness     Subjective:   [name] is a 89 y.o. female who presents for a Medicare Annual Wellness Visit.  Visit info / Clinical Intake: Medicare Wellness Visit Type:: Subsequent Annual Wellness Visit Persons participating in visit and providing information:: patient Medicare Wellness Visit Mode:: In-person (required for WTM) Interpreter Needed?: No Pre-visit prep was completed: yes AWV questionnaire completed by patient prior to visit?: no Living arrangements:: lives with spouse/significant other Patient's Overall Health Status Rating: (!) fair Typical amount of pain: (!) a lot Does pain affect daily life?: (!) yes Are you currently prescribed opioids?: no  Dietary Habits and Nutritional Risks How many meals a day?: 2 Eats fruit and vegetables daily?: yes Most meals are obtained by: preparing own meals In the last 2 weeks, have you had any of the following?: none Diabetic:: no  Functional Status Activities of Daily Living (to include ambulation/medication): Independent Ambulation: Independent with device- listed below Home Assistive Devices/Equipment: Cane Medication Administration: Independent Home Management (perform basic housework or laundry): Independent Manage your own finances?: yes Primary transportation is: driving Concerns about vision?: no *vision screening is required for WTM* Concerns about hearing?: no  Fall Screening Falls in the past year?: 1 Number of falls in past year: 1 Was there an injury with Fall?: 1 Fall Risk Category Calculator: 3 Patient Fall Risk Level: High Fall Risk  Fall Risk Patient at Risk for Falls Due to: History of fall(s); Orthopedic patient Fall risk Follow up: Falls evaluation completed; Education provided; Falls prevention discussed  Home and Transportation Safety: All rugs have non-skid backing?: yes All stairs or steps have railings?: yes Grab bars in the bathtub  or shower?: yes Have non-skid surface in bathtub or shower?: yes Good home lighting?: yes Regular seat belt use?: yes Hospital stays in the last year:: no  Cognitive Assessment Difficulty concentrating, remembering, or making decisions? : no Will 6CIT or Mini Cog be Completed: yes What year is it?: 0 points What month is it?: 0 points Give patient an address phrase to remember (5 components): [address] [place] About what time is it?: 0 points Count backwards from 20 to 1: 0 points Say the months of the year in reverse: 0 points Repeat the address phrase from earlier: 0 points 6 CIT Score: 0 points  Advance Directives (For Healthcare) Does Patient Have a Medical Advance Directive?: No Would patient like information on creating a medical advance directive?: Yes (MAU/Ambulatory/Procedural Areas - Information given)  Reviewed/Updated  Reviewed/Updated: Reviewed All (Medical, Surgical, Family, Medications, Allergies, Care Teams, Patient Goals)    Allergies (verified) Bactrim  [sulfamethoxazole -trimethoprim ] and Macrobid [nitrofurantoin]   Current Medications (verified) Outpatient Encounter Medications as of 05/31/2024  Medication Sig   acetaminophen  (TYLENOL ) 325 MG tablet Take 2 tablets (650 mg total) by mouth in the morning, at noon, and at bedtime.   ALPRAZolam  (XANAX ) 0.5 MG tablet Take 1 tablet (0.5 mg total) by mouth 3 (three) times daily as needed for anxiety or sleep.   amLODipine  (NORVASC ) 10 MG tablet Take 0.5 tablets (5 mg total) by mouth daily in the afternoon.   aspirin  EC 81 MG tablet Take 1 tablet (81 mg total) by mouth daily. Swallow whole.   furosemide (LASIX) 40 MG tablet Take 40 mg by mouth daily.   isosorbide  mononitrate (IMDUR ) 60 MG 24 hr tablet Take 1 tablet (60 mg total) by mouth 2 (two) times daily.   metoprolol  succinate (TOPROL   XL) 25 MG 24 hr tablet Take 1 tablet (25 mg total) by mouth daily.   Multiple Vitamins-Minerals (ONE A DAY WOMEN 50 PLUS)  TABS Take 1 tablet by mouth daily.   mupirocin  ointment (BACTROBAN ) 2 % Apply 1 Application topically 2 (two) times daily.   nitroGLYCERIN  (NITROSTAT ) 0.4 MG SL tablet Place 1 tablet (0.4 mg total) under the tongue every 5 (five) minutes as needed for chest pain.   rosuvastatin  (CRESTOR ) 5 MG tablet Take 1 tablet (5 mg total) by mouth at bedtime.   sodium bicarbonate  650 MG tablet Take 325 mg by mouth 3 (three) times daily.   Urelle  (URELLE /URISED) 81 MG TABS tablet Take 1 tablet (81 mg total) by mouth 3 (three) times daily.   diazepam  (VALIUM ) 5 MG tablet 1 tablet per vagina at bedtime (Patient not taking: Reported on 05/31/2024)   estradiol  (ESTRACE ) 0.1 MG/GM vaginal cream Discard plastic applicator. Insert a blueberry size amount to vaginal opening 2 nights/week (Patient not taking: Reported on 05/31/2024)   valsartan  (DIOVAN ) 40 MG tablet Take 1 tablet (40 mg total) by mouth daily. (Patient not taking: Reported on 05/31/2024)   [DISCONTINUED] amoxicillin -clavulanate (AUGMENTIN ) 500-125 MG tablet Take 1 tablet by mouth 2 (two) times daily.   [DISCONTINUED] cephALEXin  (KEFLEX ) 250 MG capsule Take 1 capsule (250 mg total) by mouth daily.   Facility-Administered Encounter Medications as of 05/31/2024  Medication   cyanocobalamin  (VITAMIN B12) injection 1,000 mcg    History: Past Medical History:  Diagnosis Date   Anemia    Anxiety    Arthritis    Bleeding from the nose Oct 2014   CAD (coronary artery disease)    a. s/p rotational atherectomy and DES placement to LAD in 2012 b. low-risk NST in 08/2018   Cataract of both eyes    Chronic kidney disease    Colon polyps    Hyperlipidemia    Hypertension    x 5-6 yrs.   LBBB (left bundle branch block)    Renal insufficiency    Past Surgical History:  Procedure Laterality Date   BREAST SURGERY     masses removed while she was pregnant   CARDIAC CATHETERIZATION  06/20/2008   severe single vessel high-grade stenosis of mid LAD at  bifurcation of diagonal 1 and setpal perforator 1; diffuse coronary calcification of L coronary system; 50% stenosis of mid RCA (Dr. DOROTHA Schwalbe)   CATARACT EXTRACTION W/PHACO Right 10/10/2013   Procedure: CATARACT EXTRACTION PHACO AND INTRAOCULAR LENS PLACEMENT (IOC);  Surgeon: Cherene Mania, MD;  Location: [facility];  Service: Ophthalmology;  Laterality: Right;  CDE:11.96   CATARACT EXTRACTION W/PHACO Left 11/18/2013   Procedure: CATARACT EXTRACTION PHACO AND INTRAOCULAR LENS PLACEMENT (IOC);  Surgeon: Cherene Mania, MD;  Location: [facility];  Service: Ophthalmology;  Laterality: Left;  CDE 15.96   COLONOSCOPY N/A 01/16/2013   Procedure: COLONOSCOPY;  Surgeon: Claudis RAYMOND Rivet, MD; igmoid colon diverticulosis as well as changes of diverticulitis involving one diverticulum. She also had external hemorrhoids.   CORONARY ANGIOPLASTY WITH STENT PLACEMENT  11/08/2010   complex high-speed rotational atherectomy of calcified LAD, diffusely disease LAD system (Dr. IVAR Sor)   EXCISION ORAL TUMOR Right 01/14/2014   Procedure: EXCISION OROPHARYNGEAL MASS;  Surgeon: Ana LELON Moccasin, MD;  Location: [facility];  Service: ENT;  Laterality: Right;   NM MYOCAR PERF WALL MOTION  05/10/2011   lexiscan  myoview ; normal pattern of perfusion in all regions; post-stress EF 55%; low risk scan    PARTIAL HYSTERECTOMY  TONSILLECTOMY     TRANSTHORACIC ECHOCARDIOGRAM  10/08/2010   EF 45-50%, mod conc LVH, grade 1 diastolic dysfunction, mildly calcified left AV cusp; calcified MV annulus   Family History  Problem Relation Age of Onset   Liver cancer Mother    Cancer Father    Heart Problems Brother    Coronary artery disease Sister        stent   Heart Problems Daughter        also stomach problems   COPD Daughter    Colon cancer Neg Hx    Social History   Occupational History   Not on file  Tobacco Use   Smoking status: Never   Smokeless tobacco: Never  Vaping Use   Vaping status: Never Used  Substance and  Sexual Activity   Alcohol use: No   Drug use: No   Sexual activity: Yes    Birth control/protection: Surgical   Tobacco Counseling Counseling given: Yes  SDOH Screenings   Food Insecurity: No Food Insecurity (05/31/2024)  Housing: Low Risk (05/31/2024)  Transportation Needs: No Transportation Needs (05/31/2024)  Utilities: Not At Risk (05/31/2024)  Depression (PHQ2-9): Medium Risk (05/31/2024)  Physical Activity: Sufficiently Active (05/31/2024)  Social Connections: Moderately Isolated (05/31/2024)  Stress: Stress Concern Present (05/31/2024)  Tobacco Use: Low Risk (05/31/2024)  Health Literacy: Adequate Health Literacy (05/31/2024)   See flowsheets for full screening details  Depression Screen PHQ 2 & 9 Depression Scale- Over the past 2 weeks, how often have you been bothered by any of the following problems? Little interest or pleasure in doing things: 0 Feeling down, depressed, or hopeless (PHQ Adolescent also includes...irritable): 3 (patient is sole caregiver for husband who is dying from cancer) PHQ-2 Total Score: 3 Trouble falling or staying asleep, or sleeping too much: 2 Feeling tired or having little energy: 1 Poor appetite or overeating (PHQ Adolescent also includes...weight loss): 0 Feeling bad about yourself - or that you are a failure or have let yourself or your family down: 0 Trouble concentrating on things, such as reading the newspaper or watching television (PHQ Adolescent also includes...like school work): 0 Moving or speaking so slowly that other people could have noticed. Or the opposite - being so fidgety or restless that you have been moving around a lot more than usual: 0 Thoughts that you would be better off dead, or of hurting yourself in some way: 0 PHQ-9 Total Score: 6 If you checked off any problems, how difficult have these problems made it for you to do your work, take care of things at home, or get along with other people?: Somewhat difficult  Depression  Treatment Depression Interventions/Treatment : Patient refuses Treatment     Goals Addressed               This Visit's Progress     I want to stay healthy so that I can continue to take care of my husband until he transitions to his next chapter (pt-stated)               Objective:    Today's Vitals   05/31/24 1416  BP: (!) 150/60  Pulse: 74  Resp: 12  SpO2: 94%  Weight: 131 lb (59.4 kg)  Height: 5' 2 (1.575 m)   Body mass index is 23.96 kg/m.  Hearing/Vision screen Hearing Screening - Comments:: Patient denies any hearing difficulties.   Vision Screening - Comments:: Patient has an appt scheduled for Feb 2026 at [facility] Immunizations and Health Maintenance  Health Maintenance  Topic Date Due   Zoster Vaccines- Shingrix (1 of 2) Never done   COVID-19 Vaccine (1 - 2025-26 season) Never done   Medicare Annual Wellness (AWV)  02/14/2024   Influenza Vaccine  08/06/2024 (Originally 12/08/2023)   DTaP/Tdap/Td (1 - Tdap) 01/14/2025 (Originally 03/03/1953)   Pneumococcal Vaccine: 50+ Years (1 of 2 - PCV) 01/14/2025 (Originally 03/03/1953)   Bone Density Scan  Completed   Meningococcal B Vaccine  Aged Out        Assessment/Plan:  This is a routine wellness examination for [name].  Patient Care Team: Bevely Doffing, FNP as PCP - General (Family Medicine) Alvan, Dorn FALCON, MD as Consulting Physician (Cardiology) Rachele Gaynell RAMAN, MD as Referring Physician (Nephrology)  I have personally reviewed and noted the following in the patients chart:   Medical and social history Use of alcohol, tobacco or illicit drugs  Current medications and supplements including opioid prescriptions. Functional ability and status Nutritional status Physical activity Advanced directives List of other physicians Hospitalizations, surgeries, and ER visits in previous 12 months Vitals Screenings to include cognitive, depression, and falls Referrals and  appointments  No orders of the defined types were placed in this encounter.  In addition, I have reviewed and discussed with patient certain preventive protocols, quality metrics, and best practice recommendations. A written personalized care plan for preventive services as well as general preventive health recommendations were provided to patient.   [name], CMA   05/31/2024   Return June 02, 2025 ag 2:30pm, for In office Medicare Well Visit w  Wellness Nurse.  After Visit Summary: (In Person-Declined) Patient declined AVS at this time.   "

## 2024-05-31 NOTE — Patient Instructions (Signed)
 [name],  Thank you for taking the time for your Medicare Wellness Visit. I appreciate your continued commitment to your health goals. Please review the care plan we discussed, and feel free to reach out if I can assist you further.  Please note that Annual Wellness Visits do not include a physical exam. Some assessments may be limited, especially if the visit was conducted virtually. If needed, we may recommend an in-person follow-up with your provider.  Ongoing Care Seeing your primary care provider every 3 to 6 months helps us  monitor your health and provide consistent, personalized care.   Referrals If a referral was made during today's visit and you haven't received any updates within two weeks, please contact the referred provider directly to check on the status.  Recommended Screenings:  Health Maintenance  Topic Date Due   Zoster (Shingles) Vaccine (1 of 2) Never done   COVID-19 Vaccine (1 - 2025-26 season) Never done   Medicare Annual Wellness Visit  02/14/2024   Flu Shot  08/06/2024*   DTaP/Tdap/Td vaccine (1 - Tdap) 01/14/2025*   Pneumococcal Vaccine for age over 22 (1 of 2 - PCV) 01/14/2025*   Osteoporosis screening with Bone Density Scan  Completed   Meningitis B Vaccine  Aged Out  *Topic was postponed. The date shown is not the original due date.       05/31/2024    2:30 PM  Advanced Directives  Does Patient Have a Medical Advance Directive? No  Would patient like information on creating a medical advance directive? Yes (MAU/Ambulatory/Procedural Areas - Information given)    Vision: Annual vision screenings are recommended for early detection of glaucoma, cataracts, and diabetic retinopathy. These exams can also reveal signs of chronic conditions such as diabetes and high blood pressure.  Dental: Annual dental screenings help detect early signs of oral cancer, gum disease, and other conditions linked to overall health, including heart disease and  diabetes.  Please see the attached documents for additional preventive care recommendations.

## 2024-05-31 NOTE — Telephone Encounter (Signed)
 Placard Copied Noted Sleeved Original placed provider box Francie) Copy placed front desk folder

## 2024-07-15 ENCOUNTER — Ambulatory Visit

## 2024-08-30 ENCOUNTER — Ambulatory Visit: Admitting: Student

## 2025-06-02 ENCOUNTER — Ambulatory Visit: Payer: Self-pay
